# Patient Record
Sex: Female | Born: 1950 | Race: White | Hispanic: Yes | Marital: Married | Smoking: Never smoker
Health system: Southern US, Community
[De-identification: ages and names within clinical notes are randomized; demographics above are authoritative.]

## PROBLEM LIST (undated history)

## (undated) DIAGNOSIS — B571 Acute Chagas' disease without heart involvement: Principal | ICD-10-CM

## (undated) DIAGNOSIS — T8859XA Other complications of anesthesia, initial encounter: Secondary | ICD-10-CM

## (undated) DIAGNOSIS — D509 Iron deficiency anemia, unspecified: Secondary | ICD-10-CM

## (undated) DIAGNOSIS — E538 Deficiency of other specified B group vitamins: Secondary | ICD-10-CM

## (undated) DIAGNOSIS — J309 Allergic rhinitis, unspecified: Secondary | ICD-10-CM

## (undated) DIAGNOSIS — R51 Headache: Secondary | ICD-10-CM

## (undated) DIAGNOSIS — T4145XA Adverse effect of unspecified anesthetic, initial encounter: Secondary | ICD-10-CM

## (undated) DIAGNOSIS — K219 Gastro-esophageal reflux disease without esophagitis: Secondary | ICD-10-CM

## (undated) HISTORY — DX: Headache: R51

## (undated) HISTORY — DX: Acute Chagas' disease without heart involvement: B57.1

## (undated) HISTORY — PX: BUNIONECTOMY: SHX129

## (undated) HISTORY — DX: Deficiency of other specified B group vitamins: E53.8

## (undated) HISTORY — DX: Other complications of anesthesia, initial encounter: T88.59XA

## (undated) HISTORY — PX: NEUROMA SURGERY: SHX722

## (undated) HISTORY — DX: Allergic rhinitis, unspecified: J30.9

## (undated) HISTORY — DX: Adverse effect of unspecified anesthetic, initial encounter: T41.45XA

## (undated) HISTORY — DX: Iron deficiency anemia, unspecified: D50.9

## (undated) HISTORY — PX: BARTHOLIN CYST MARSUPIALIZATION: SHX5383

---

## 2006-10-18 ENCOUNTER — Other Ambulatory Visit: Admission: RE | Admit: 2006-10-18 | Discharge: 2006-10-18 | Payer: Self-pay | Admitting: Obstetrics and Gynecology

## 2006-10-20 ENCOUNTER — Encounter: Admission: RE | Admit: 2006-10-20 | Discharge: 2006-10-20 | Payer: Self-pay | Admitting: Obstetrics and Gynecology

## 2007-10-24 ENCOUNTER — Encounter: Admission: RE | Admit: 2007-10-24 | Discharge: 2007-10-24 | Payer: Self-pay | Admitting: Obstetrics and Gynecology

## 2007-11-07 ENCOUNTER — Other Ambulatory Visit: Admission: RE | Admit: 2007-11-07 | Discharge: 2007-11-07 | Payer: Self-pay | Admitting: Obstetrics and Gynecology

## 2008-03-15 LAB — CONVERTED CEMR LAB: Pap Smear: NORMAL

## 2008-10-22 LAB — CONVERTED CEMR LAB: Pap Smear: NORMAL

## 2008-11-04 ENCOUNTER — Encounter: Admission: RE | Admit: 2008-11-04 | Discharge: 2008-11-04 | Payer: Self-pay | Admitting: Obstetrics & Gynecology

## 2009-05-19 ENCOUNTER — Ambulatory Visit: Payer: Self-pay | Admitting: Internal Medicine

## 2009-05-19 DIAGNOSIS — R51 Headache: Secondary | ICD-10-CM

## 2009-05-19 DIAGNOSIS — J309 Allergic rhinitis, unspecified: Secondary | ICD-10-CM

## 2009-05-19 DIAGNOSIS — R519 Headache, unspecified: Secondary | ICD-10-CM | POA: Insufficient documentation

## 2009-05-19 HISTORY — DX: Headache: R51

## 2009-05-19 HISTORY — DX: Allergic rhinitis, unspecified: J30.9

## 2009-09-23 ENCOUNTER — Encounter: Payer: Self-pay | Admitting: Internal Medicine

## 2009-11-04 ENCOUNTER — Encounter: Admission: RE | Admit: 2009-11-04 | Discharge: 2009-11-04 | Payer: Self-pay | Admitting: Obstetrics & Gynecology

## 2009-11-04 LAB — HM MAMMOGRAPHY: HM Mammogram: NORMAL

## 2009-12-02 LAB — CONVERTED CEMR LAB: Pap Smear: NORMAL

## 2010-01-27 ENCOUNTER — Ambulatory Visit: Payer: Self-pay | Admitting: Internal Medicine

## 2010-01-27 DIAGNOSIS — I6529 Occlusion and stenosis of unspecified carotid artery: Secondary | ICD-10-CM

## 2010-02-03 ENCOUNTER — Encounter: Payer: Self-pay | Admitting: Internal Medicine

## 2010-02-06 ENCOUNTER — Ambulatory Visit: Payer: Self-pay

## 2010-02-06 ENCOUNTER — Encounter: Payer: Self-pay | Admitting: Internal Medicine

## 2010-04-07 ENCOUNTER — Telehealth: Payer: Self-pay | Admitting: Internal Medicine

## 2010-04-14 NOTE — Assessment & Plan Note (Signed)
Summary: PER PT PER OPTHAMOLOGIST SEE PC PHY-OTHER BLOODWORK/TEST-BEHI...   Vital Signs:  Patient profile:   60 year old female Menstrual status:  postmenopausal Height:      65 inches Weight:      141.50 pounds BMI:     23.63 O2 Sat:      96 % on Room air Temp:     97.6 degrees F oral Pulse rate:   61 / minute Pulse rhythm:   regular Resp:     16 per minute BP sitting:   104 / 62  (left arm) Cuff size:   regular  Vitals Entered By: Estell Harpin CMA (January 27, 2010 11:16 AM)  O2 Flow:  Room air CC: follow-up visit per optometrist, Preventive Care  Does patient need assistance? Functional Status Self care Ambulation Normal     Menstrual Status postmenopausal Last PAP Result Normal   Primary Care Provider:  Ronnald Ramp  CC:  follow-up visit per optometrist and Preventive Care.  History of Present Illness: She returns with a note from an Optometrist in [place] that states she has some ocular hemorrhages and asks that I look at her carotid arteries for disease. The pt. had some labs done 2 months ago by Dr. Marisa Hua in [place] but she can't get the results from that doctor. She was prescribed Crestor before but did not take it and one other statin caused muscle aches.  Preventive Screening-Counseling & Management  Alcohol-Tobacco     Alcohol drinks/day: 0     Alcohol Counseling: not indicated; patient does not drink     Smoking Status: never     Tobacco Counseling: not indicated; no tobacco use  Hep-HIV-STD-Contraception     Hepatitis Risk: no risk noted     HIV Risk: no risk noted     STD Risk: no risk noted      Sexual History:  currently monogamous.        Drug Use:  no.        Blood Transfusions:  no.    Clinical Review Panels:  Prevention   Last Mammogram:  Normal Bilateral (11/04/2009)   Last Pap Smear:  Normal (12/02/2009)  Immunizations   Last Tetanus Booster:  Tdap (05/19/2009)   Current Medications (verified): 1)  Zyrtec-D Allergy & Congestion  5-120 Mg Xr12h-Tab (Cetirizine-Pseudoephedrine) 2)  Calcium Citrate .Marland Kitchen.. 2 Once Daily 3)  Vitamin D3 5000 .... Take 1 Tablet By Mouth Once A Day 4)  Fish Oil 1000 .... Take 1 Tablet By Mouth Once A Day 5)  Dhea 77m .... Take 1 Tablet By Mouth Once A Day 6)  Resveratrol Plus .... Take 1 Tablet By Mouth Once A Day 7)  Bio- Hormone Cream  Allergies (verified): No Known Drug Allergies  Past History:  Past Medical History: Last updated: 05/19/2009 Allergic rhinitis Headache  Past Surgical History: Last updated: 05/19/2009 Denies surgical history  Family History: Last updated: 05/19/2009 Family History of Alcoholism/Addiction Family History Hypertension Family History of Prostate CA 1st degree relative <50  Social History: Last updated: 05/19/2009 Occupation: RN Married Never Smoked Alcohol use-no Drug use-no Regular exercise-no  Risk Factors: Alcohol Use: 0 (01/27/2010) Exercise: no (05/19/2009)  Risk Factors: Smoking Status: never (01/27/2010)  Family History: Reviewed history from 05/19/2009 and no changes required. Family History of Alcoholism/Addiction Family History Hypertension Family History of Prostate CA 1st degree relative <50  Social History: Reviewed history from 05/19/2009 and no changes required. 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   Neuro:  Denies brief paralysis, difficulty with concentration, disturbances in coordination, memory loss, numbness, poor balance, seizures, sensation of room spinning, tingling, tremors, visual disturbances, and weakness.  Physical Exam  General:  alert, well-developed, well-nourished, well-hydrated, appropriate dress, normal appearance, healthy-appearing, cooperative  to examination, and good hygiene.   Mouth:  good dentition, no gingival abnormalities, no dental plaque, pharynx pink and moist, no erythema, no exudates, and no posterior lymphoid hypertrophy.   Neck:  No deformities, masses, or tenderness noted.normal carotid upstroke, no carotid bruits, and no cervical lymphadenopathy.   Lungs:  Normal respiratory effort, chest expands symmetrically. Lungs are clear to auscultation, no crackles or wheezes. Heart:  Normal rate and regular rhythm. S1 and S2 normal without gallop, murmur, click, rub or other extra sounds. Abdomen:  Bowel sounds positive,abdomen soft and non-tender without masses, organomegaly or hernias noted. Msk:  No deformity or scoliosis noted of thoracic or lumbar spine.   Pulses:  R and L carotid,radial,femoral,dorsalis pedis and posterior tibial pulses are full and equal bilaterally Extremities:  No clubbing, cyanosis, edema, or deformity noted with normal full range of motion of all joints.   Neurologic:  No cranial nerve deficits noted. Station and gait are normal. Plantar reflexes are down-going bilaterally. DTRs are symmetrical throughout. Sensory, motor and coordinative functions appear intact. Skin:  Intact without suspicious lesions or rashes Cervical Nodes:  no anterior cervical adenopathy and no posterior cervical adenopathy.   Psych:  Cognition and judgment appear intact. Alert and cooperative with normal attention span and concentration. No apparent delusions, illusions, hallucinations   Impression & Recommendations:  Problem # 1:  CAROTID ARTERY DISEASE (ICD-433.10) Assessment New labs from Dr. Doroteo Bradford have been requested, I will check an u/s Orders: Doppler Referral (Doppler)  Complete Medication List: 1)  Zyrtec-d Allergy & Congestion 5-120 Mg Xr12h-tab (Cetirizine-pseudoephedrine) 2)  Calcium Citrate  .Marland Kitchen.. 2 once daily 3)  Vitamin D3 5000  .... Take 1 tablet by mouth once a day 4)  Fish Oil 1000  .... Take 1 tablet by  mouth once a day 5)  Dhea 65m  .... Take 1 tablet by mouth once a day 6)  Resveratrol Plus  .... Take 1 tablet by mouth once a day 7)  Bio- Hormone Cream   PAP Screening:    Hx Cervical Dysplasia in last 5 yrs? No    3 normal PAP smears in last 5 yrs? Yes    Last PAP smear:  12/02/2009  PAP Smear Results:    Date of Exam:  12/02/2009    Results:  Normal  Mammogram Screening:    Last Mammogram:  11/04/2009  Mammogram Results:    Date of Exam:  11/04/2009    Results:  Normal Bilateral  Osteoporosis Risk Assessment:  Risk Factors for Fracture or Low Bone Density:   Smoking status:       never  Immunization & Chemoprophylaxis:    Tetanus vaccine: Tdap  (05/19/2009)  Patient Instructions: 1)  Please schedule a follow-up appointment in 1 month.   Orders Added: 1)  Doppler Referral [Doppler] 2)  Est. Patient Level III [[40981]

## 2010-04-14 NOTE — Letter (Signed)
Summary: [place] [facility] [employer] [facility]   Imported By: Phillis Knack 01/30/2010 13:08:54  _____________________________________________________________________  External Attachment:    Type:   Image     Comment:   External Document

## 2010-04-14 NOTE — Assessment & Plan Note (Signed)
Summary: NEW/ [employer] / [employer] #   Vital Signs:  Patient profile:   60 year old female Height:      65 inches Weight:      149 pounds BMI:     24.88 O2 Sat:      97 % on Room air Temp:     98.4 degrees F oral Pulse rate:   92 / minute Pulse rhythm:   regular Resp:     16 per minute BP sitting:   100 / 70  (left arm) Cuff size:   large  Vitals Entered By: Estell Harpin CMA (May 19, 2009 1:08 PM)  O2 Flow:  Room air CC: chest congestion, cough w/ dark yellow mucus, Bilateral  ear pain, chills x 4days, URI symptoms, Preventive Care Is Patient Diabetic? No Pain Assessment Patient in pain? yes     Location: ears   Primary Care Provider:  Ronnald Ramp  CC:  chest congestion, cough w/ dark yellow mucus, Bilateral  ear pain, chills x 4days, URI symptoms, and Preventive Care.  History of Present Illness:  URI Symptoms      This is a 60 year old woman who presents with URI symptoms.  The symptoms began 4 days ago.  The severity is described as mild.  The patient reports sore throat, productive cough, earache, and sick contacts, but denies nasal congestion, clear nasal discharge, and purulent nasal discharge.  The patient denies fever, stiff neck, dyspnea, wheezing, rash, vomiting, diarrhea, use of an antipyretic, and response to antipyretic.  The patient also reports headache, muscle aches, and severe fatigue.  The patient denies seasonal symptoms.  The patient denies the following risk factors for Strep sinusitis: unilateral facial pain, unilateral nasal discharge, poor response to decongestant, double sickening, tooth pain, Strep exposure, tender adenopathy, and absence of cough.    Preventive Screening-Counseling & Management  Alcohol-Tobacco     Alcohol drinks/day: 0     Smoking Status: never  Caffeine-Diet-Exercise     Does Patient Exercise: no  Hep-HIV-STD-Contraception     Hepatitis Risk: no risk noted     HIV Risk: no risk noted     STD Risk: no risk noted      Sexual History:   currently monogamous.        Drug Use:  no.        Blood Transfusions:  no.    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    Physical Exam  General:  alert, well-developed, well-nourished, well-hydrated, appropriate dress, normal appearance, healthy-appearing, cooperative to examination, and good hygiene.   Head:  normocephalic, atraumatic, no abnormalities observed, and no abnormalities palpated.   Eyes:  vision grossly intact, pupils equal, pupils round, and  pupils reactive to light.   Ears:  R ear normal and L ear normal.   Nose:  no external deformity, no airflow obstruction, no nasal polyps, no nasal mucosal lesions, no mucosal friability, no active bleeding or clots, no sinus percussion tenderness, mucosal erythema, and mucosal edema.   Neck:  No deformities, masses, or tenderness noted. Lungs:  Normal respiratory effort, chest expands symmetrically. Lungs are clear to auscultation, no crackles or wheezes. Heart:  Normal rate and regular rhythm. S1 and S2 normal without gallop, murmur,  click, rub or other extra sounds. Abdomen:  Bowel sounds positive,abdomen soft and non-tender without masses, organomegaly or hernias noted. Msk:  No deformity or scoliosis noted of thoracic or lumbar spine.   Pulses:  R and L carotid,radial,femoral,dorsalis pedis and posterior tibial pulses are full and equal bilaterally Extremities:  No clubbing, cyanosis, edema, or deformity noted with normal full range of motion of all joints.   Neurologic:  No cranial nerve deficits noted. Station and gait are normal. Plantar reflexes are down-going bilaterally. DTRs are symmetrical throughout. Sensory, motor and coordinative functions appear intact. Skin:  Intact without suspicious lesions or rashes Cervical Nodes:  no anterior cervical adenopathy and no posterior cervical adenopathy.   Axillary Nodes:  no R axillary adenopathy and no L axillary adenopathy.   Inguinal Nodes:  no R inguinal adenopathy and no L inguinal adenopathy.   Psych:  Cognition and judgment appear intact. Alert and cooperative with normal attention span and concentration. No apparent delusions, illusions, hallucinations   Impression & Recommendations:  Problem # 1:  BRONCHITIS-ACUTE (ICD-466.0) Assessment New  Her updated medication list for this problem includes:    Zyrtec-d Allergy & Congestion 5-120 Mg Xr12h-tab (Cetirizine-pseudoephedrine)    Zithromax Tri-pak 500 Mg Tab (Azithromycin) .Marland Kitchen... Take as directed one by mouth once daily for 3 days    Mytussin Ac 100-10 Mg/76m Syrp (Guaifenesin-codeine) ..Marland Kitchen.. 5-10 ml by mouth qid as needed for cough  Take antibiotics and other medications as directed. Encouraged to push clear liquids, get enough rest, and take acetaminophen as needed. To be seen in 5-7 days if no improvement, sooner if worse.  Problem # 2:  ALLERGIC RHINITIS (ICD-477.9) Assessment: Deteriorated  Discussed use of allergy medications and environmental measures.   Problem # 3:  COUGH (ICD-786.2) Assessment:  New will look for pna, edema, mass Orders: T-2 View CXR (71020TC)  Complete Medication List: 1)  Zyrtec-d Allergy & Congestion 5-120 Mg Xr12h-tab (Cetirizine-pseudoephedrine) 2)  Calcium  3)  Vitamins  4)  Zithromax Tri-pak 500 Mg Tab (Azithromycin) .... Take as directed one by mouth once daily for 3 days 5)  Mytussin Ac 100-10 Mg/575mSyrp (Guaifenesin-codeine) .... 5-10 ml by mouth qid as needed for cough  Other Orders: Tdap => [id number] (90(95284dmin 1st Vaccine (90(13244PAP Screening:    Hx Cervical Dysplasia in last 5 yrs? No    3 normal PAP smears in last 5 yrs? Yes    Last PAP smear:  10/22/2008  PAP Smear Results:    Date of Exam:  10/22/2008    Results:  Normal  Mammogram Screening:    Last Mammogram:  10/29/2008  Mammogram Results:    Date of Exam:  10/29/2008    Results:  Normal Bilateral  Osteoporosis Risk Assessment:  Risk Factors for Fracture or Low Bone Density:   Smoking status:       never  Immunization & Chemoprophylaxis:    Tetanus vaccine: Tdap  (05/19/2009)  Patient Instructions: 1)  Please schedule a follow-up appointment in 2 weeks. 2)  Take your antibiotic as prescribed until ALL of it is gone, but stop if you develop a rash or swelling and contact our office as soon as possible. 3)  Acute bronchitis symptoms for less than 10 days are not helped by antibiotics. take over the counter cough medications. call if no improvment in  5-7 days, sooner if increasing cough, fever, or new symptoms( shortness of breath, chest pain). Prescriptions: MYTUSSIN AC 100-10 MG/5ML SYRP (GUAIFENESIN-CODEINE) 5-10 ml by mouth QID as needed for cough  #6 ounces x 0   Entered and Authorized by:   Janith Lima MD   Signed by:   Janith Lima MD on 05/19/2009   Method used:   Print then Give to Patient   RxID:   [id number] ZITHROMAX TRI-PAK 500 MG TAB (AZITHROMYCIN) Take as directed one by mouth once daily for 3 days  #3 x 0   Entered and Authorized by:    Janith Lima MD   Signed by:   Janith Lima MD on 05/19/2009   Method used:   Print then Give to Patient   RxID:   [id number]   Preventive Care Screening  Mammogram:    Date:  03/15/2008    Results:  normal   Pap Smear:    Date:  03/15/2008    Results:  normal    Immunizations Administered:  Tetanus Vaccine:    Vaccine Type: Tdap    Site: right deltoid    Mfr: GlaxoSmithKline    Dose: 0.5 ml    Route: IM    Given by: Estell Harpin CMA    Exp. Date: 05/10/2011    Lot #: [id number]    VIS given: 01/31/07 version given May 19, 2009.

## 2010-04-14 NOTE — Miscellaneous (Signed)
Summary: Orders Update  Clinical Lists Changes  Orders: Added new Test order of Carotid Duplex (Carotid Duplex) - Signed 

## 2010-04-16 NOTE — Progress Notes (Signed)
  Phone Note Other Incoming Message from:  Patient on April 07, 2010 9:44 AM  Caller: [phone] Summary of Call: Pt requesting results of her carotid doppler, Please Advise Initial call taken by: Ami Bullins CMA,  April 07, 2010 9:46 AM  Follow-up for Phone Call        normal Follow-up by: Janith Lima MD,  April 07, 2010 10:01 AM  Additional Follow-up for Phone Call Additional follow up Details #1::        advised pt that results were normal Additional Follow-up by: Ami Bullins CMA,  April 08, 2010 9:42 AM

## 2010-10-30 ENCOUNTER — Other Ambulatory Visit: Payer: Self-pay | Admitting: Obstetrics & Gynecology

## 2010-10-30 DIAGNOSIS — Z1231 Encounter for screening mammogram for malignant neoplasm of breast: Secondary | ICD-10-CM

## 2010-11-06 ENCOUNTER — Ambulatory Visit
Admission: RE | Admit: 2010-11-06 | Discharge: 2010-11-06 | Disposition: A | Discharge status: Home / Self Care | Payer: BC Managed Care – PPO | Source: Ambulatory Visit | Attending: Obstetrics & Gynecology | Admitting: Obstetrics & Gynecology

## 2010-11-06 DIAGNOSIS — Z1231 Encounter for screening mammogram for malignant neoplasm of breast: Secondary | ICD-10-CM

## 2010-12-29 ENCOUNTER — Encounter: Payer: Self-pay | Admitting: Internal Medicine

## 2010-12-29 ENCOUNTER — Ambulatory Visit (INDEPENDENT_AMBULATORY_CARE_PROVIDER_SITE_OTHER): Payer: BC Managed Care – PPO | Admitting: Internal Medicine

## 2010-12-29 ENCOUNTER — Other Ambulatory Visit (INDEPENDENT_AMBULATORY_CARE_PROVIDER_SITE_OTHER): Payer: BC Managed Care – PPO

## 2010-12-29 VITALS — BP 112/70 | HR 90 | Temp 98.5°F | Ht 65.0 in

## 2010-12-29 DIAGNOSIS — B572 Chagas' disease (chronic) with heart involvement: Secondary | ICD-10-CM

## 2010-12-29 DIAGNOSIS — R5381 Other malaise: Secondary | ICD-10-CM

## 2010-12-29 DIAGNOSIS — B571 Acute Chagas' disease without heart involvement: Secondary | ICD-10-CM

## 2010-12-29 DIAGNOSIS — R5383 Other fatigue: Secondary | ICD-10-CM | POA: Insufficient documentation

## 2010-12-29 HISTORY — DX: Chagas' disease (chronic) with heart involvement: B57.2

## 2010-12-29 HISTORY — DX: Acute Chagas' disease without heart involvement: B57.1

## 2010-12-29 LAB — CBC WITH DIFFERENTIAL/PLATELET
Basophils Absolute: 0.1 10*3/uL (ref 0.0–0.1)
Eosinophils Relative: 2.3 % (ref 0.0–5.0)
HCT: 38 % (ref 36.0–46.0)
Hemoglobin: 12.7 g/dL (ref 12.0–15.0)
Lymphocytes Relative: 34.8 % (ref 12.0–46.0)
Lymphs Abs: 2.1 10*3/uL (ref 0.7–4.0)
Monocytes Relative: 10.9 % (ref 3.0–12.0)
Neutro Abs: 3.2 10*3/uL (ref 1.4–7.7)
Platelets: 262 10*3/uL (ref 150.0–400.0)
RDW: 13.5 % (ref 11.5–14.6)
WBC: 6.2 10*3/uL (ref 4.5–10.5)

## 2010-12-29 NOTE — Patient Instructions (Addendum)
Your EKG was ok today You will be contacted regarding the referral for: echocardiogram, and Infectious Disease Please go to LAB in the Basement for the blood tests to be done today Please call the phone number [phone] (the [facility]) for results of testing in 2-3 days;  When calling, simply dial the number, and when prompted enter the MRN number above (the Medical Record Number) and the # key, then the message should start.

## 2010-12-29 NOTE — Progress Notes (Signed)
  Subjective:    Patient ID: Donna Holmes, female    DOB: [birth year], 60 y.o.   MRN: [id number]  HPI   60yo F, [employer] nurse, here to c/o ongoing diarrhea, though mild is persistent.  Pt was in [place] in Feb/mar 2012, with onset diarrhea at that time, initially worse then and some slowing in freq and severity since then without n/v, high fever/chills, blood or signficant wt loss.  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   She has some left neck pain sharp, minor without radicular pain but no other symptom recent onset   Pt denies chest pain, increased sob or doe, wheezing, orthopnea, PND, increased LE swelling, palpitations, dizziness or syncope.  Pt denies new neurological symptoms such as new headache, or facial or extremity weakness or numbness.   Pt denies polydipsia, polyuria. She has looked up some information online and notes anti-parasitic tx may require working with the CDC. Does have sense of ongoing fatigue. Past Medical History  Diagnosis Date  . ALLERGIC RHINITIS 05/19/2009  . CAROTID ARTERY DISEASE 01/27/2010  . Headache 05/19/2009  . 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 No pertinent past surgical history.  reports that she has never smoked. She does not have any smokeless tobacco history on file. She reports that she does not drink alcohol or use illicit drugs. family history includes Alcohol abuse in her other; Hypertension in her other; and Prostate cancer in her other. No Known Allergies No current outpatient prescriptions on file prior to visit.   Review of Systems Review of Systems  Constitutional: Negative for diaphoresis and unexpected weight change.  HENT: Negative for drooling and tinnitus.   Eyes: Negative for photophobia and visual disturbance.    Respiratory: Negative for choking and stridor.   Gastrointestinal: Negative for vomiting and blood in stool.  Genitourinary: Negative for hematuria and decreased urine volume.  Musculoskeletal: Negative for gait problem.  Skin: Negative for color change and wound.  Neurological: Negative for tremors and numbness.  Psychiatric/Behavioral: Negative for decreased concentration. The patient is not hyperactive.      Objective:   Physical Exam BP 112/70  Pulse 90  Temp(Src) 98.5 F (36.9 C) (Oral)  Ht 5' 5" (1.651 m)  SpO2 95% Physical Exam  VS noted Constitutional: Pt appears well-developed and well-nourished.  HENT: Head: Normocephalic.  Right Ear: External ear normal.  Left Ear: External ear normal.  Eyes: Conjunctivae and EOM are normal. Pupils are equal, round, and reactive to light.  Neck: Normal range of motion. Neck supple.  Cardiovascular: Normal rate and regular rhythm.   Pulmonary/Chest: Effort normal and breath sounds normal.  Abd:  Soft, NT, non-distended, + BS Neurological: Pt is alert. No cranial nerve deficit.  Skin: Skin is warm. No erythema. No LE edema Psychiatric: Pt behavior is normal. Thought content normal.     Assessment & Plan:

## 2010-12-29 NOTE — Assessment & Plan Note (Signed)
Still with some symptoms mild diarrhea, but fortunately o/w doing well;  ECG reviewed as per emr, also for echo, and refer ID for individiulized tx , that may need coordination with the CDC

## 2010-12-30 LAB — BASIC METABOLIC PANEL
Calcium: 9.6 mg/dL (ref 8.4–10.5)
GFR: 92.96 mL/min (ref >60.00)
Glucose, Bld: 83 mg/dL (ref 70–99)
Potassium: 4.3 mEq/L (ref 3.5–5.1)
Sodium: 139 mEq/L (ref 135–145)

## 2010-12-30 LAB — HEPATIC FUNCTION PANEL
AST: 25 U/L (ref 0–37)
Albumin: 4.4 g/dL (ref 3.5–5.2)
Alkaline Phosphatase: 41 U/L (ref 39–117)
Total Bilirubin: 0.7 mg/dL (ref 0.3–1.2)

## 2010-12-30 NOTE — Progress Notes (Signed)
Quick Note:  Voice message left on [employer] system - lab is negative, normal or otherwise stable, pt to continue same tx ______

## 2010-12-31 ENCOUNTER — Telehealth: Payer: Self-pay | Admitting: *Deleted

## 2010-12-31 NOTE — Telephone Encounter (Signed)
I referred pt to ID for this purpose as I have no experience or expertise and is beyond my field of training

## 2010-12-31 NOTE — Telephone Encounter (Signed)
Dr. Jenny Reichmann saw her for this

## 2010-12-31 NOTE — Telephone Encounter (Signed)
Pt called triage stating:Dr. Ronnald Ramp needs to contact [place]on Pier at [facility] ph # [phone] to advise PCP as to what treatment and/or further eval of Chagas' disease is needed.

## 2010-12-31 NOTE — Telephone Encounter (Signed)
Dr. Jenny Reichmann- Please advise.

## 2011-01-01 NOTE — Telephone Encounter (Signed)
Pt advised and will contact [facility]

## 2011-01-04 ENCOUNTER — Ambulatory Visit: Payer: BC Managed Care – PPO | Admitting: Infectious Disease

## 2011-01-06 ENCOUNTER — Encounter: Payer: Self-pay | Admitting: Infectious Disease

## 2011-01-06 ENCOUNTER — Ambulatory Visit (INDEPENDENT_AMBULATORY_CARE_PROVIDER_SITE_OTHER): Payer: BC Managed Care – PPO | Admitting: Infectious Disease

## 2011-01-06 VITALS — BP 113/74 | HR 80 | Temp 98.1°F | Wt 144.0 lb

## 2011-01-06 DIAGNOSIS — R5383 Other fatigue: Secondary | ICD-10-CM

## 2011-01-06 DIAGNOSIS — B571 Acute Chagas' disease without heart involvement: Secondary | ICD-10-CM

## 2011-01-06 DIAGNOSIS — B572 Chagas' disease (chronic) with heart involvement: Secondary | ICD-10-CM

## 2011-01-06 DIAGNOSIS — R5381 Other malaise: Secondary | ICD-10-CM

## 2011-01-06 DIAGNOSIS — R197 Diarrhea, unspecified: Secondary | ICD-10-CM

## 2011-01-06 DIAGNOSIS — R0789 Other chest pain: Secondary | ICD-10-CM

## 2011-01-06 NOTE — Assessment & Plan Note (Signed)
Sounds like could have been a travellers diarrhea. Has largely resolved though still with some lose stools

## 2011-01-06 NOTE — Assessment & Plan Note (Signed)
She certainly has the epidemiologic risk for infection with T Cruzi as a child in [place] or with recent trips there (though that exposure would be less likely to put her at risk given where she travelled) [facility] paras[facility]ology lab today is no longer open. 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 If labs are positive I would consider treatment in absence of contraindication. I will talk further with my colleague Dr. Linus Salmons who is a Chagas expert and with the CDC. I will likely in that case want to check echo to evaluate for cardiomyopathy.

## 2011-01-06 NOTE — Progress Notes (Signed)
Subjective:    Patient ID: Donna Holmes, female    DOB: 1950/11/14, 61 y.o.   MRN: [id number]  HPI  59 year old [place] Native referred to our clinic for consult and  evaluation of antibodies positive for Chagas.  [place], and then [place]  moved to the [place] in [place]. Homes were adobe, but not thatched when she was a child. She did not recognize the reduvid bug when shown images specifically. She did not recall ever having had the periorbital swelling of the [place] sign as a child or since then.She had also Gave blood in 1990s but she believes (and she may be right) that Chagas was not assayed for then. She travels to [place] every year for a month or two every year, more in urban centers Were [place] in February in [place]. She had a diarrheal illness when she returned in February. Non bloody diarrhea. No fevers, with abdominal pain. She has continued to have loose stools once in a while. This was checked for parasites and was negative. Her 12 point ros is pertinent for fatigue, and also occasional chest tightness with vigorous walking that lasts a few seconds and she thinks may be anxiety. She also has noticed difficulty breathing while sleeping on her right side but not on back or left side. She has no problems with swallowing. [phone] [place]on Pier  Review of Systems  Constitutional: Negative for fever, chills, diaphoresis, activity change, appetite change, fatigue and unexpected weight change.  HENT: Negative for congestion, sore throat, rhinorrhea, sneezing, trouble swallowing and sinus pressure.   Eyes: Negative for photophobia and visual disturbance.  Respiratory: Negative for cough, chest tightness, shortness of breath, wheezing and stridor.   Cardiovascular: Negative for chest pain, palpitations and leg swelling.  Gastrointestinal: Negative for nausea, vomiting, abdominal pain, diarrhea, constipation, blood in stool, abdominal distention and  anal bleeding.  Genitourinary: Negative for dysuria, hematuria, flank pain and difficulty urinating.  Musculoskeletal: Negative for myalgias, back pain, joint swelling, arthralgias and gait problem.  Skin: Negative for color change, pallor, rash and wound.  Neurological: Negative for dizziness, tremors, weakness and light-headedness.  Hematological: Negative for adenopathy. Does not bruise/bleed easily.  Psychiatric/Behavioral: Negative for behavioral problems, confusion, sleep disturbance, dysphoric mood, decreased concentration and agitation.       Objective:   Physical Exam  Constitutional: She is oriented to person, place, and time. She appears well-developed and well-nourished. No distress.  HENT:  Head: Normocephalic and atraumatic.  Mouth/Throat: Oropharynx is clear and moist. No oropharyngeal exudate.  Eyes: Conjunctivae and EOM are normal. Pupils are equal, round, and reactive to light. No scleral icterus.  Neck: Normal range of motion. Neck supple. No JVD present.  Cardiovascular: Normal rate, regular rhythm and normal heart sounds.  Exam reveals no gallop and no friction rub.   No murmur heard. Pulmonary/Chest: Effort normal and breath sounds normal. No respiratory distress. She has no wheezes. She has no rales. She exhibits no tenderness.  Abdominal: She exhibits no distension and no mass. There is no tenderness. There is no rebound and no guarding.  Musculoskeletal: She exhibits no edema and no tenderness.  Lymphadenopathy:    She has no cervical adenopathy.  Neurological: She is alert and oriented to person, place, and time. She has normal reflexes. She exhibits normal muscle tone. Coordination normal.  Skin: Skin is warm and dry. She is not diaphoretic. No erythema. No pallor.  Psychiatric: She has a normal  mood and affect. Her behavior is normal. Judgment and thought content normal.          Assessment & Plan:

## 2011-01-06 NOTE — Assessment & Plan Note (Signed)
With exertion but otherwise withotu any associated symptoms and not typical of angina. WIll get TTE if Chagas labs positive

## 2011-01-06 NOTE — Assessment & Plan Note (Signed)
Gradually getting better

## 2011-01-07 NOTE — Progress Notes (Signed)
Addended by: Dolan Amen D on: 01/07/2011 11:12 AM   Modules accepted: Orders

## 2011-01-11 ENCOUNTER — Other Ambulatory Visit: Payer: BC Managed Care – PPO

## 2011-01-12 ENCOUNTER — Telehealth: Payer: Self-pay | Admitting: *Deleted

## 2011-01-12 NOTE — Telephone Encounter (Signed)
[facility] called for our Tax ID number. I gave it to them & it did not come up when they ran it. I asked Lamont Snowball in lab to find out if [facility] has a tax ID # for all of our labs. Sh eis going to check with the manager

## 2011-01-13 ENCOUNTER — Other Ambulatory Visit (HOSPITAL_COMMUNITY): Payer: BC Managed Care – PPO | Admitting: Radiology

## 2011-01-15 ENCOUNTER — Telehealth: Payer: Self-pay | Admitting: *Deleted

## 2011-01-15 NOTE — Telephone Encounter (Signed)
Dr. Lillia Abed called from the [facility] wanting to know what prompted pt to come to md. Reviewed notes. She had given blood & it came back positive for Chagas. I told md her presenting symptoms & travel history. She is going to call pt's internal medicine office to speak with him

## 2011-01-18 LAB — OTHER SOLSTAS TEST

## 2011-01-26 ENCOUNTER — Telehealth: Payer: Self-pay | Admitting: Licensed Clinical Social Worker

## 2011-01-26 NOTE — Telephone Encounter (Signed)
She has an appointment on 02/10/2011.

## 2011-01-26 NOTE — Telephone Encounter (Signed)
[facility] called stating that the Chagas T cruzi IFA was positive high reactive at 1:256 and positive for T Cruzi EIA 2.411 the lab will be faxing results as well.

## 2011-01-26 NOTE — Telephone Encounter (Signed)
Donna Holmes thanks, does she have a followup appt with [place]? I would like to review this test and the one she had from [facility] and consider? We should treat her, though I may need to consult with Dr. Linus Salmons our Chagas expert

## 2011-01-27 ENCOUNTER — Telehealth: Payer: Self-pay | Admitting: Infectious Disease

## 2011-01-27 ENCOUNTER — Other Ambulatory Visit: Payer: Self-pay | Admitting: Infectious Disease

## 2011-01-27 DIAGNOSIS — B571 Acute Chagas' disease without heart involvement: Secondary | ICD-10-CM

## 2011-01-27 NOTE — Telephone Encounter (Signed)
Patients T cruzi IgG antibody by IFA came back positive at >1"32 AND HER T. Cruzi IgG by EIA came back positive at 2.411 from the [facility]  We had send serum to [facility] but they only ran a IgM assay which is not useful in this patient.  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  I would like to check a 2 d echocardiogram and also proceed with getting Benznidazole for treatment. I will call patient to inform her.

## 2011-01-29 ENCOUNTER — Ambulatory Visit (HOSPITAL_COMMUNITY): Payer: BC Managed Care – PPO | Attending: Internal Medicine | Admitting: Radiology

## 2011-01-29 DIAGNOSIS — I079 Rheumatic tricuspid valve disease, unspecified: Secondary | ICD-10-CM | POA: Insufficient documentation

## 2011-01-29 DIAGNOSIS — R5381 Other malaise: Secondary | ICD-10-CM | POA: Insufficient documentation

## 2011-01-29 DIAGNOSIS — I379 Nonrheumatic pulmonary valve disorder, unspecified: Secondary | ICD-10-CM | POA: Insufficient documentation

## 2011-01-29 DIAGNOSIS — B571 Acute Chagas' disease without heart involvement: Secondary | ICD-10-CM

## 2011-01-29 DIAGNOSIS — I059 Rheumatic mitral valve disease, unspecified: Secondary | ICD-10-CM | POA: Insufficient documentation

## 2011-01-29 DIAGNOSIS — R0789 Other chest pain: Secondary | ICD-10-CM

## 2011-01-29 DIAGNOSIS — R5383 Other fatigue: Secondary | ICD-10-CM

## 2011-02-10 ENCOUNTER — Ambulatory Visit (INDEPENDENT_AMBULATORY_CARE_PROVIDER_SITE_OTHER): Payer: BC Managed Care – PPO | Admitting: Infectious Disease

## 2011-02-10 ENCOUNTER — Encounter: Payer: Self-pay | Admitting: Infectious Disease

## 2011-02-10 DIAGNOSIS — R5383 Other fatigue: Secondary | ICD-10-CM

## 2011-02-10 DIAGNOSIS — B571 Acute Chagas' disease without heart involvement: Secondary | ICD-10-CM

## 2011-02-10 DIAGNOSIS — R5381 Other malaise: Secondary | ICD-10-CM

## 2011-02-10 NOTE — Progress Notes (Signed)
Subjective:    Patient ID: Donna Holmes, female    DOB: Dec 14, 1950, 60 y.o.   MRN: [id number]  HPI  60 year old [place] Native referred to our clinic for consult and evaluation of antibodies positive for Chagas. [place] were [place], and then [place] moved to the [place] in [place]. Homes were adobe, but not thatched when she was a child. She did not recognize the reduvid bug when shown images specifically. She did not recall ever having had the periorbital swelling of the [place] sign as a child or since then.She had also Gave blood in 1990s but she believes (and she may be right) that Chagas was not assayed for then. She travels to [place] every year for a month or two every year, more in urban centers [place]. She had a diarrheal illness when she returned in February. Non bloody diarrhea. No fevers, with abdominal pain. She has continued to have loose stools once in a while. This was checked for parasites and was negative. Serologies were positive for Chagas by EIA and IFA. I have contacted CDC and am currently filling out forms to acquire benznidazole as IND (it is not licensed by FDA in [place]) for rx of chronic Chagas. I informed her that her biological son will also need to get checked due to concerns of vertical transmission. I explained side effects, and risks benefits and gave her paperwork on the medicine from CDC>  [phone] [place]   Review of Systems  Constitutional: Negative for fever, chills, diaphoresis, activity change, appetite change, fatigue and unexpected weight change.  HENT: Negative for congestion, sore throat, rhinorrhea, sneezing, trouble swallowing and sinus pressure.   Eyes: Negative for photophobia and visual disturbance.  Respiratory: Negative for cough, chest tightness, shortness of breath, wheezing and stridor.   Cardiovascular: Negative for chest pain, palpitations and leg swelling.  Gastrointestinal:  Negative for nausea, vomiting, abdominal pain, diarrhea, constipation, blood in stool, abdominal distention and anal bleeding.  Genitourinary: Negative for dysuria, hematuria, flank pain and difficulty urinating.  Musculoskeletal: Negative for myalgias, back pain, joint swelling, arthralgias and gait problem.  Skin: Negative for color change, pallor, rash and wound.  Neurological: Negative for dizziness, tremors, weakness and light-headedness.  Hematological: Negative for adenopathy. Does not bruise/bleed easily.  Psychiatric/Behavioral: Negative for behavioral problems, confusion, sleep disturbance, dysphoric mood, decreased concentration and agitation.       Objective:   Physical Exam  Constitutional: She is oriented to person, place, and time. She appears well-developed and well-nourished. No distress.  HENT:  Head: Normocephalic and atraumatic.  Mouth/Throat: Oropharynx is clear and moist. No oropharyngeal exudate.  Eyes: Conjunctivae and EOM are normal. Pupils are equal, round, and reactive to light. No scleral icterus.  Neck: Normal range of motion. Neck supple. No JVD present.  Cardiovascular: Normal rate, regular rhythm and normal heart sounds.  Exam reveals no gallop and no friction rub.   No murmur heard. Pulmonary/Chest: Effort normal and breath sounds normal. No respiratory distress. She has no wheezes. She has no rales. She exhibits no tenderness.  Abdominal: She exhibits no distension and no mass. There is no tenderness. There is no rebound and no guarding.  Musculoskeletal: She exhibits no edema and no tenderness.  Lymphadenopathy:    She has no cervical adenopathy.  Neurological: She is alert and oriented to person, place, and time. She has normal reflexes. She exhibits normal muscle tone. Coordination normal.  Skin:  Skin is warm and dry. She is not diaphoretic. No erythema. No pallor.  Psychiatric: She has a normal mood and affect. Her behavior is normal. Judgment and  thought content normal.          Assessment & Plan:

## 2011-02-10 NOTE — Assessment & Plan Note (Signed)
Acquiring meds from [facility]

## 2011-02-10 NOTE — Assessment & Plan Note (Signed)
imrpoved

## 2011-03-03 ENCOUNTER — Telehealth: Payer: Self-pay | Admitting: *Deleted

## 2011-03-03 ENCOUNTER — Ambulatory Visit: Payer: BC Managed Care – PPO | Admitting: Infectious Disease

## 2011-03-03 NOTE — Telephone Encounter (Signed)
Per Dr. Tommy Medal research medication if not ready for pt to start.  Dr. Tommy Medal requested that appt be scheduled for the end of January.  Pt given appt for 04/13/11.

## 2011-03-22 ENCOUNTER — Ambulatory Visit (INDEPENDENT_AMBULATORY_CARE_PROVIDER_SITE_OTHER): Payer: BC Managed Care – PPO | Admitting: Infectious Disease

## 2011-03-22 ENCOUNTER — Encounter: Payer: Self-pay | Admitting: Infectious Disease

## 2011-03-22 VITALS — Wt 145.5 lb

## 2011-03-22 DIAGNOSIS — B571 Acute Chagas' disease without heart involvement: Secondary | ICD-10-CM

## 2011-03-22 DIAGNOSIS — R51 Headache: Secondary | ICD-10-CM

## 2011-03-22 NOTE — Assessment & Plan Note (Signed)
chonic unchanged

## 2011-03-22 NOTE — Assessment & Plan Note (Signed)
I have obtained benznidazole and we will start at 164m twice daily for 60 days, Will bring back for check of cbc and cmp in 2 wks time and followup visit

## 2011-03-22 NOTE — Progress Notes (Signed)
  Subjective:    Patient ID: Donna Holmes, female    DOB: Dec 30, 1950, 61 y.o.   MRN: [id number]  HPI  61 year old lady with Chagas disease. I have obtained benznidazole from the CDC to treat Donna Holmes. Based on her weight of 66kg today in clinic her dose should be 12m or one and three fourths tablet taken after meals for 60 days.I explained risks and benefits of using this medication which in the [place] to be given as IND because it is not manufacured in [place] is only permitted to be used under IND by the FDA via release from the CDC. Benefits include cure of Chagas disease. Alternative therapy would be with nifurtimox which is a less efficacious drug with more side effects and is also only available as IND from the CDC. Main side effects observed include rashes, including photosensitive ones, paresthesias, headache, anorexia, insomnia, and rare abnormalities in LFts, wbc, platelets change in taste, The patient has signed informed consent [facility] and [facility]. I spent greater than 45 minutes with the patient including greater than 50% of time in face to face counsel of the patient and in coordination of their care.  Of note the pt already has some chronic numbness in her finger on right hand and also does already suffer from chronic headaches.  I spent greater than 45 minutes with the patient including greater than 50% of time in face to face counsel of the patient and in coordination of their care.   Review of Systems  Constitutional: Negative for fever, chills, diaphoresis, activity change, appetite change, fatigue and unexpected weight change.  HENT: Negative for congestion, sore throat, rhinorrhea, sneezing, trouble swallowing and sinus pressure.   Eyes: Negative for photophobia and visual disturbance.  Respiratory: Negative for cough, chest tightness, shortness of breath, wheezing and stridor.   Cardiovascular: Negative for chest pain, palpitations and  leg swelling.  Gastrointestinal: Negative for nausea, vomiting, abdominal pain, diarrhea, constipation, blood in stool, abdominal distention and anal bleeding.  Genitourinary: Negative for dysuria, hematuria, flank pain and difficulty urinating.  Musculoskeletal: Negative for myalgias, back pain, joint swelling, arthralgias and gait problem.  Skin: Negative for color change, pallor, rash and wound.  Neurological: Negative for dizziness, tremors, weakness and light-headedness.  Hematological: Negative for adenopathy. Does not bruise/bleed easily.  Psychiatric/Behavioral: Negative for behavioral problems, confusion, sleep disturbance, dysphoric mood, decreased concentration and agitation.       Objective:   Physical Exam  Constitutional: She is oriented to person, place, and time. She appears well-developed and well-nourished. No distress.  HENT:  Head: Normocephalic and atraumatic.  Eyes: EOM are normal.  Neck: Normal range of motion. Neck supple.  Cardiovascular: Normal rate and regular rhythm.   Pulmonary/Chest: Effort normal and breath sounds normal. No respiratory distress.  Abdominal: She exhibits no distension.  Neurological: She is alert and oriented to person, place, and time. She has normal reflexes. Coordination normal.  Skin: Skin is warm and dry. She is not diaphoretic. No erythema. No pallor.  Psychiatric: She has a normal mood and affect. Her behavior is normal. Judgment and thought content normal.          Assessment & Plan:  Chagas' disease I have obtained benznidazole and we will start at 1779mtwice daily for 60 days, Will bring back for check of cbc and cmp in 2 wks time and followup visit  Headache chonic unchanged

## 2011-04-01 ENCOUNTER — Other Ambulatory Visit: Payer: BC Managed Care – PPO | Admitting: *Deleted

## 2011-04-01 LAB — COMPLETE METABOLIC PANEL WITH GFR
ALT: 30 U/L (ref 0–35)
AST: 23 U/L (ref 0–37)
Alkaline Phosphatase: 55 U/L (ref 39–117)
BUN: 17 mg/dL (ref 6–23)
Calcium: 9.6 mg/dL (ref 8.4–10.5)
Chloride: 101 mEq/L (ref 96–112)
Creat: 0.78 mg/dL (ref 0.50–1.10)
Total Bilirubin: 0.5 mg/dL (ref 0.3–1.2)

## 2011-04-01 LAB — CBC WITH DIFFERENTIAL/PLATELET
Basophils Absolute: 0.1 10*3/uL (ref 0.0–0.1)
Basophils Relative: 1 % (ref 0–1)
Eosinophils Relative: 4 % (ref 0–5)
Lymphocytes Relative: 27 % (ref 12–46)
MCHC: 33.4 g/dL (ref 30.0–36.0)
MCV: 88 fL (ref 78.0–100.0)
Neutro Abs: 3.4 10*3/uL (ref 1.7–7.7)
Platelets: 208 10*3/uL (ref 150–400)
RDW: 13.4 % (ref 11.5–15.5)
WBC: 5.3 10*3/uL (ref 4.0–10.5)

## 2011-04-05 ENCOUNTER — Encounter: Payer: Self-pay | Admitting: Infectious Disease

## 2011-04-05 ENCOUNTER — Ambulatory Visit (INDEPENDENT_AMBULATORY_CARE_PROVIDER_SITE_OTHER): Payer: BC Managed Care – PPO | Admitting: Infectious Disease

## 2011-04-05 DIAGNOSIS — R5383 Other fatigue: Secondary | ICD-10-CM

## 2011-04-05 DIAGNOSIS — B571 Acute Chagas' disease without heart involvement: Secondary | ICD-10-CM

## 2011-04-05 DIAGNOSIS — R0789 Other chest pain: Secondary | ICD-10-CM

## 2011-04-05 NOTE — Progress Notes (Signed)
  Subjective:    Patient ID: Donna Holmes, female    DOB: 1951/01/08, 61 y.o.   MRN: [id number]  HPI 61 year old lady with Chagas disease. I Obtained benznidazole from the CDC to treat Mrs Raynor and started her on this at the last clinic visit. She is tolerating the meds without much problem. Her lfts and cbc are completely normal. She states that she does Feels a little bit more tired. At night feeling of chest pressure difficulty with a deep breath wh ich she had prior to starting the meds is slightly worse. Has been going on for months.   Review of Systems  Constitutional: Negative for fever, chills, diaphoresis, activity change, appetite change, fatigue and unexpected weight change.  HENT: Negative for congestion, sore throat, rhinorrhea, sneezing, trouble swallowing and sinus pressure.   Eyes: Negative for photophobia and visual disturbance.  Respiratory: Negative for cough, chest tightness, shortness of breath, wheezing and stridor.   Cardiovascular: Negative for chest pain, palpitations and leg swelling.  Gastrointestinal: Negative for nausea, vomiting, abdominal pain, diarrhea, constipation, blood in stool, abdominal distention and anal bleeding.  Genitourinary: Negative for dysuria, hematuria, flank pain and difficulty urinating.  Musculoskeletal: Negative for myalgias, back pain, joint swelling, arthralgias and gait problem.  Skin: Negative for color change, pallor, rash and wound.  Neurological: Negative for dizziness, tremors, weakness and light-headedness.  Hematological: Negative for adenopathy. Does not bruise/bleed easily.  Psychiatric/Behavioral: Negative for behavioral problems, confusion, sleep disturbance, dysphoric mood, decreased concentration and agitation.       Objective:   Physical Exam  Constitutional: She is oriented to person, place, and time. She appears well-developed and well-nourished. No distress.  HENT:  Head: Normocephalic and atraumatic.    Mouth/Throat: Oropharynx is clear and moist. No oropharyngeal exudate.  Eyes: Conjunctivae and EOM are normal. Pupils are equal, round, and reactive to light. No scleral icterus.  Neck: Normal range of motion. Neck supple. No JVD present.  Cardiovascular: Normal rate, regular rhythm and normal heart sounds.  Exam reveals no gallop and no friction rub.   No murmur heard. Pulmonary/Chest: Effort normal and breath sounds normal. No respiratory distress. She has no wheezes. She has no rales. She exhibits no tenderness.  Abdominal: She exhibits no distension and no mass. There is no tenderness. There is no rebound and no guarding.  Musculoskeletal: She exhibits no edema and no tenderness.  Lymphadenopathy:    She has no cervical adenopathy.  Neurological: She is alert and oriented to person, place, and time. She has normal reflexes. She exhibits normal muscle tone. Coordination normal.  Skin: Skin is warm and dry. She is not diaphoretic. No erythema. No pallor.  Psychiatric: She has a normal mood and affect. Her behavior is normal. Judgment and thought content normal.          Assessment & Plan:  Chagas' disease Continue benznidazole and complete 2 month treatment, bring back in 3 wks for check and for repeat blood work  Chest tightness Likely due to anxiety.   Fatigue Was present prior to meds being initiated. No need for further workup from my standpoint

## 2011-04-05 NOTE — Assessment & Plan Note (Signed)
Was present prior to meds being initiated. No need for further workup from my standpoint

## 2011-04-05 NOTE — Assessment & Plan Note (Signed)
Likely due to anxiety.

## 2011-04-05 NOTE — Assessment & Plan Note (Signed)
Continue benznidazole and complete 2 month treatment, bring back in 3 wks for check and for repeat blood work

## 2011-04-13 ENCOUNTER — Ambulatory Visit: Payer: BC Managed Care – PPO | Admitting: Infectious Disease

## 2011-04-14 ENCOUNTER — Ambulatory Visit: Payer: BC Managed Care – PPO | Admitting: Infectious Disease

## 2011-04-26 ENCOUNTER — Ambulatory Visit (INDEPENDENT_AMBULATORY_CARE_PROVIDER_SITE_OTHER): Payer: BC Managed Care – PPO | Admitting: Infectious Disease

## 2011-04-26 ENCOUNTER — Other Ambulatory Visit: Payer: Self-pay | Admitting: Infectious Disease

## 2011-04-26 ENCOUNTER — Encounter: Payer: Self-pay | Admitting: Infectious Disease

## 2011-04-26 VITALS — BP 111/71 | HR 88 | Temp 97.6°F | Wt 145.0 lb

## 2011-04-26 DIAGNOSIS — T50904A Poisoning by unspecified drugs, medicaments and biological substances, undetermined, initial encounter: Secondary | ICD-10-CM

## 2011-04-26 DIAGNOSIS — B571 Acute Chagas' disease without heart involvement: Secondary | ICD-10-CM

## 2011-04-26 DIAGNOSIS — L270 Generalized skin eruption due to drugs and medicaments taken internally: Secondary | ICD-10-CM | POA: Insufficient documentation

## 2011-04-26 LAB — CBC WITH DIFFERENTIAL/PLATELET
Basophils Absolute: 0.2 10*3/uL — ABNORMAL HIGH (ref 0.0–0.1)
Basophils Relative: 3 % — ABNORMAL HIGH (ref 0–1)
Lymphocytes Relative: 30 % (ref 12–46)
MCHC: 33.8 g/dL (ref 30.0–36.0)
Monocytes Absolute: 0.7 10*3/uL (ref 0.1–1.0)
Neutro Abs: 3.1 10*3/uL (ref 1.7–7.7)
Platelets: 263 10*3/uL (ref 150–400)
RDW: 14 % (ref 11.5–15.5)
WBC: 6.2 10*3/uL (ref 4.0–10.5)

## 2011-04-26 LAB — COMPLETE METABOLIC PANEL WITH GFR
Albumin: 4 g/dL (ref 3.5–5.2)
BUN: 15 mg/dL (ref 6–23)
CO2: 29 mEq/L (ref 19–32)
Calcium: 10.1 mg/dL (ref 8.4–10.5)
Chloride: 103 mEq/L (ref 96–112)
GFR, Est African American: 80 mL/min
GFR, Est Non African American: 69 mL/min
Glucose, Bld: 85 mg/dL (ref 70–99)
Potassium: 4.3 mEq/L (ref 3.5–5.3)
Sodium: 141 mEq/L (ref 135–145)
Total Protein: 7.4 g/dL (ref 6.0–8.3)

## 2011-04-26 MED ORDER — TRIAMCINOLONE ACETONIDE 0.5 % EX CREA: TOPICAL_CREAM | Freq: Three times a day (TID) | CUTANEOUS | Status: DC

## 2011-04-26 MED ORDER — HYDROXYZINE HCL 25 MG PO TABS: 25.0000 mg | ORAL_TABLET | ORAL | Status: AC

## 2011-04-26 MED ORDER — PREDNISONE 10 MG PO TABS: 20.0000 mg | ORAL_TABLET | ORAL | Status: DC

## 2011-04-26 NOTE — Assessment & Plan Note (Signed)
Continue current dose pending further guidance from CDC. CHeck cbc and cmp

## 2011-04-26 NOTE — Patient Instructions (Signed)
I have called the CDC drug service and they will call and or email me back today with further instructions  For now they agree with approach of trying topical steroids (triamicinolone) along with  Generic claritin (loratidine) 65m daily  And vistaril(hydroxyzine) 25 to 562mat night time and every eight hours as needed for itching  RTC in one week but call Dr. VaTommy Medalf side effects worsen

## 2011-04-26 NOTE — Assessment & Plan Note (Signed)
Will give her topical triamcinolone , claritin, and vistaril. I am corresponding with CDC for further guidance

## 2011-04-26 NOTE — Progress Notes (Signed)
Subjective:    Patient ID: Donna Holmes, female    DOB: 1950/06/30, 61 y.o.   MRN: [id number]  HPI  61 year old lady with Chagas disease. I Obtained benznidazole from the [facility] and we started her on therapy at 128m bid on 03/22/11 to treat [name] She initially tolerated these meds without problems. However, last Monday the 4th she developed a fine "sandpaper" like rash on her abdomen that worsened after she went outside later in the week. Rash extended to her chest, back legs and arms. She has also been experiencing intense pruritis, made worse after she showers. She has no exfoliation, no lip or eyelid swelling and no mucosal involvement. I called the CDC and am corresponding with them via email and phone later today. Recs in the literature provided by CDC suggest continuing the medicine with topical and possibly even systemic steroid, which I will do along with anti pruritic therapy for now. I spent greater than 45 minutes with the patient including greater than 50% of time in face to face counsel of the patient and in coordination of their care.  Review of Systems  Constitutional: Negative for fever, chills, diaphoresis, activity change, appetite change, fatigue and unexpected weight change.  HENT: Negative for congestion, sore throat, rhinorrhea, sneezing, trouble swallowing and sinus pressure.   Eyes: Negative for photophobia and visual disturbance.  Respiratory: Negative for cough, chest tightness, shortness of breath, wheezing and stridor.   Cardiovascular: Negative for chest pain, palpitations and leg swelling.  Gastrointestinal: Negative for nausea, vomiting, abdominal pain, diarrhea, constipation, blood in stool, abdominal distention and anal bleeding.  Genitourinary: Negative for dysuria, hematuria, flank pain and difficulty urinating.  Musculoskeletal: Negative for myalgias, back pain, joint swelling, arthralgias and gait problem.  Skin: Positive for color change and rash. Negative  for pallor and wound.  Neurological: Negative for dizziness, tremors, weakness and light-headedness.  Hematological: Negative for adenopathy. Does not bruise/bleed easily.  Psychiatric/Behavioral: Negative for behavioral problems, confusion, sleep disturbance, dysphoric mood, decreased concentration and agitation.       Objective:   Physical Exam  Constitutional: She is oriented to person, place, and time. She appears well-developed and well-nourished. No distress.  HENT:  Head: Normocephalic and atraumatic.  Mouth/Throat: Oropharynx is clear and moist. No oropharyngeal exudate.  Eyes: Conjunctivae and EOM are normal. Pupils are equal, round, and reactive to light. No scleral icterus.  Neck: Normal range of motion. Neck supple. No JVD present.  Cardiovascular: Normal rate, regular rhythm and normal heart sounds.  Exam reveals no gallop and no friction rub.   No murmur heard. Pulmonary/Chest: Effort normal and breath sounds normal. No respiratory distress. She has no wheezes. She has no rales. She exhibits no tenderness.  Abdominal: She exhibits no distension and no mass. There is no tenderness. There is no rebound and no guarding.  Musculoskeletal: She exhibits no edema and no tenderness.  Lymphadenopathy:    She has no cervical adenopathy.  Neurological: She is alert and oriented to person, place, and time. She has normal reflexes. She exhibits normal muscle tone. Coordination normal.  Skin: Skin is warm and dry. Rash noted. She is not diaphoretic. There is erythema. No pallor.     Psychiatric: She has a normal mood and affect. Her behavior is normal. Judgment and thought content normal.          Assessment & Plan:  Drug rash Will give her topical triamcinolone , claritin, and vistaril. I am corresponding with CDC for further guidance  Chagas' disease Continue current dose pending further guidance from [facility]. CHeck cbc and cmp

## 2011-05-05 ENCOUNTER — Ambulatory Visit (INDEPENDENT_AMBULATORY_CARE_PROVIDER_SITE_OTHER): Payer: BC Managed Care – PPO | Admitting: Infectious Disease

## 2011-05-05 ENCOUNTER — Encounter: Payer: Self-pay | Admitting: Infectious Disease

## 2011-05-05 DIAGNOSIS — L270 Generalized skin eruption due to drugs and medicaments taken internally: Secondary | ICD-10-CM

## 2011-05-05 DIAGNOSIS — B572 Chagas' disease (chronic) with heart involvement: Secondary | ICD-10-CM

## 2011-05-05 DIAGNOSIS — T50904A Poisoning by unspecified drugs, medicaments and biological substances, undetermined, initial encounter: Secondary | ICD-10-CM

## 2011-05-05 DIAGNOSIS — B571 Acute Chagas' disease without heart involvement: Secondary | ICD-10-CM

## 2011-05-05 NOTE — Progress Notes (Signed)
  Subjective:    Patient ID: Donna Holmes, female    DOB: [birth year], 61 y.o.   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 I Obtained benznidazole from the [facility] and we started her on therapy at 117m bid on 03/22/11 to treat [name] She initially tolerated these meds without problems. However, on Feb the 4th she developed a fine "sandpaper" like rash on her abdomen that worsened after she went outside later in the week. Rash extended to her chest, back legs and arms. She had also been experiencing intense pruritis, made worse after she showers. She has no exfoliation, no lip or eyelid swelling and no mucosal involvement. I called the CDC and corresponded w ith them via email and phone later today. Per their recommendations we put her on a prednisone taper and she has responded very nicely to it with rash having improved dramatically. Pruritis is minimal. She has no other symptoms.   Review of Systems  Constitutional: Negative for fever, chills, diaphoresis, activity change, appetite change, fatigue and unexpected weight change.  HENT: Negative for congestion, sore throat, rhinorrhea, sneezing, trouble swallowing and sinus pressure.   Eyes: Negative for photophobia and visual disturbance.  Respiratory: Negative for cough, chest tightness, shortness of breath, wheezing and stridor.   Cardiovascular: Negative for chest pain, palpitations and leg swelling.  Gastrointestinal: Negative for nausea, vomiting, abdominal pain, diarrhea, constipation, blood in stool, abdominal distention and anal bleeding.  Genitourinary: Negative for dysuria, hematuria, flank pain and difficulty urinating.  Musculoskeletal: Negative for myalgias, back pain, joint swelling, arthralgias and gait problem.  Skin: Positive for rash. Negative for color change, pallor and wound.  Neurological: Negative for dizziness, tremors, weakness and light-headedness.  Hematological: Negative for adenopathy. Does not  bruise/bleed easily.  Psychiatric/Behavioral: Negative for behavioral problems, confusion, sleep disturbance, dysphoric mood, decreased concentration and agitation.       Objective:   Physical Exam  Constitutional: She is oriented to person, place, and time. She appears well-developed and well-nourished. No distress.  HENT:  Head: Normocephalic and atraumatic.  Mouth/Throat: Oropharynx is clear and moist. No oropharyngeal exudate.  Eyes: Conjunctivae and EOM are normal. Pupils are equal, round, and reactive to light. No scleral icterus.  Neck: Normal range of motion. Neck supple. No JVD present.  Cardiovascular: Normal rate, regular rhythm and normal heart sounds.  Exam reveals no gallop and no friction rub.   No murmur heard. Pulmonary/Chest: Effort normal and breath sounds normal. No respiratory distress. She has no wheezes. She has no rales. She exhibits no tenderness.  Abdominal: She exhibits no distension and no mass. There is no tenderness. There is no rebound and no guarding.  Musculoskeletal: She exhibits no edema and no tenderness.  Lymphadenopathy:    She has no cervical adenopathy.  Neurological: She is alert and oriented to person, place, and time. She has normal reflexes. She exhibits normal muscle tone. Coordination normal.  Skin: Skin is warm and dry. She is not diaphoretic. No erythema. No pallor.     Psychiatric: She has a normal mood and affect. Her behavior is normal. Judgment and thought content normal.          Assessment & Plan:  Drug rash Continue prednisone taper per CDC watch for relapse with taper  Chagas' disease Finish her two month course

## 2011-05-05 NOTE — Assessment & Plan Note (Signed)
Finish her two month course

## 2011-05-05 NOTE — Assessment & Plan Note (Signed)
Continue prednisone taper per CDC watch for relapse with taper

## 2011-05-12 ENCOUNTER — Telehealth: Payer: Self-pay | Admitting: *Deleted

## 2011-05-12 MED ORDER — PREDNISONE 10 MG PO TABS: 20.0000 mg | ORAL_TABLET | ORAL | Status: DC

## 2011-05-12 NOTE — Telephone Encounter (Signed)
Pt reports that the rash reappeared when she decreased the dose of prednisone to 32m/day.  It was so bad that she changed back to 266mday 2 days ago.  She is now down to 1 days worth of prednisone.  The "study" medication ends on Thurs., March 7th.  Pt wanting additional prednisone to cover rash.  Pt [facility] at the [facility]. VaTommy Medallease advise.

## 2011-05-12 NOTE — Telephone Encounter (Signed)
PTS RASH WORSE WITH STEROID TAPER, NOW WORSE, GOING BACK UP TO 20MG A DAY FOR 10 DAYS, THEN 10 FOR 7 DAYS

## 2011-05-13 NOTE — Telephone Encounter (Signed)
Telephone call to pt.  Informed that Dr. Tommy Medal has continued the prednisone and that the rx is ready to pick up at [place], [place]  Pt verbalized understanding.

## 2011-05-19 ENCOUNTER — Ambulatory Visit (INDEPENDENT_AMBULATORY_CARE_PROVIDER_SITE_OTHER): Payer: BC Managed Care – PPO | Admitting: Infectious Disease

## 2011-05-19 ENCOUNTER — Encounter: Payer: Self-pay | Admitting: Infectious Disease

## 2011-05-19 DIAGNOSIS — T50904A Poisoning by unspecified drugs, medicaments and biological substances, undetermined, initial encounter: Secondary | ICD-10-CM

## 2011-05-19 DIAGNOSIS — L270 Generalized skin eruption due to drugs and medicaments taken internally: Secondary | ICD-10-CM

## 2011-05-19 DIAGNOSIS — B571 Acute Chagas' disease without heart involvement: Secondary | ICD-10-CM

## 2011-05-19 NOTE — Assessment & Plan Note (Signed)
Will be finished with therapy tomorrow

## 2011-05-19 NOTE — Assessment & Plan Note (Signed)
Proposed dose escalation of prednisone but pt refused. Will proceed with current taper. Benznidazole will be out of her system in next two days

## 2011-05-19 NOTE — Progress Notes (Signed)
Subjective:    Patient ID: Donna Holmes, female    DOB: 20-Jan-1951, 61 y.o.   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 I Obtained benznidazole from the [facility] and we started her on therapy at 174m bid on 03/22/11 to treat Donna Holmes She initially tolerated these meds without problems. However, on Feb the 4th she developed a fine "sandpaper" like rash on her abdomen that worsened after she went outside later in the week. Rash extended to her chest, back legs and arms. She had also been experiencing intense pruritis, made worse after she showers. She has no exfoliation, no lip or eyelid swelling and no mucosal involvement. I called the CDC and corresponded w ith them via email and phone later today. Per their recommendations we put her on a prednisone taper and she hadresponded very nicely. Unfortunately when she tapered down to 10 mg per day her rash again flared he went back to 20 mg per day but she still had persistence of a rash. As you should like up to the higher dose she is having difficulty tolerating the prednisone feeling jittery. She is taking Atarax and feels that her pruritus and the rash is currently manageable he has one more day left of of anti-parasitic drugs.  Review of Systems  Constitutional: Negative for fever, chills, diaphoresis, activity change, appetite change, fatigue and unexpected weight change.  HENT: Negative for congestion, sore throat, rhinorrhea, sneezing, trouble swallowing and sinus pressure.   Eyes: Negative for photophobia and visual disturbance.  Respiratory: Negative for cough, chest tightness, shortness of breath, wheezing and stridor.   Cardiovascular: Negative for chest pain, palpitations and leg swelling.  Gastrointestinal: Negative for nausea, vomiting, abdominal pain, diarrhea, constipation, blood in stool, abdominal distention and anal bleeding.  Genitourinary: Negative for dysuria, hematuria, flank pain and difficulty urinating.    Musculoskeletal: Negative for myalgias, back pain, joint swelling, arthralgias and gait problem.  Skin: Positive for rash. Negative for color change, pallor and wound.  Neurological: Negative for dizziness, tremors, weakness and light-headedness.  Hematological: Negative for adenopathy. Does not bruise/bleed easily.  Psychiatric/Behavioral: Negative for behavioral problems, confusion, sleep disturbance, dysphoric mood, decreased concentration and agitation.       Objective:   Physical Exam  Constitutional: She is oriented to person, place, and time. She appears well-developed and well-nourished. No distress.  HENT:  Head: Normocephalic and atraumatic.  Mouth/Throat: Oropharynx is clear and moist. No oropharyngeal exudate.  Eyes: Conjunctivae and EOM are normal. Pupils are equal, round, and reactive to light. No scleral icterus.  Neck: Normal range of motion. Neck supple. No JVD present.  Cardiovascular: Normal rate, regular rhythm and normal heart sounds.  Exam reveals no gallop and no friction rub.   No murmur heard. Pulmonary/Chest: Effort normal and breath sounds normal. No respiratory distress. She has no wheezes. She has no rales. She exhibits no tenderness.  Abdominal: She exhibits no distension and no mass. There is no tenderness. There is no rebound and no guarding.  Musculoskeletal: She exhibits no edema and no tenderness.  Lymphadenopathy:    She has no cervical adenopathy.  Neurological: She is alert and oriented to person, place, and time. She has normal reflexes. She exhibits normal muscle tone. Coordination normal.  Skin: Skin is warm and dry. She is not diaphoretic. No erythema. No pallor.          Is worse than last visit, simlar to when I saw her the first time with this  Psychiatric:  She has a normal mood and affect. Her behavior is normal. Judgment and thought content normal.          Assessment & Plan:  Drug rash Proposed dose escalation of prednisone but pt  refused. Will proceed with current taper. Benznidazole will be out of her system in next two days  Chagas' disease Will be finished with therapy tomorrow

## 2011-07-01 ENCOUNTER — Telehealth: Payer: Self-pay | Admitting: Infectious Disease

## 2011-07-01 NOTE — Telephone Encounter (Signed)
I need to see Donna Holmes in followup so that I can fill out paperwork to send to the [facility]. Can we set her up with an appt with me in May?

## 2011-07-01 NOTE — Telephone Encounter (Signed)
Sure, I have scheduled her for 5/21 @ 4:15pm.  Tamika: can you call the patient please? Thanks Roselyn Reef

## 2011-07-01 NOTE — Telephone Encounter (Signed)
Actually, she already has an appointment scheduled on 07/15/11 so I cancelled the 5/21 appt Thanks! Roselyn Reef

## 2011-07-15 ENCOUNTER — Encounter: Payer: Self-pay | Admitting: Infectious Disease

## 2011-07-15 ENCOUNTER — Ambulatory Visit (INDEPENDENT_AMBULATORY_CARE_PROVIDER_SITE_OTHER): Payer: BC Managed Care – PPO | Admitting: Infectious Disease

## 2011-07-15 VITALS — BP 100/44 | HR 78 | Temp 97.9°F | Ht 65.0 in | Wt 147.0 lb

## 2011-07-15 DIAGNOSIS — B571 Acute Chagas' disease without heart involvement: Secondary | ICD-10-CM

## 2011-07-15 DIAGNOSIS — L270 Generalized skin eruption due to drugs and medicaments taken internally: Secondary | ICD-10-CM

## 2011-07-15 DIAGNOSIS — T50904A Poisoning by unspecified drugs, medicaments and biological substances, undetermined, initial encounter: Secondary | ICD-10-CM

## 2011-07-15 NOTE — Progress Notes (Signed)
Subjective:    Patient ID: Donna Holmes, female    DOB: 1951/02/23, 61 y.o.   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 I Obtained benznidazole from the [facility] and we started her on therapy at 161m bid on 03/22/11 to treat Mrs MFobes She initially tolerated these meds without problems. However, on Feb the 4th she developed a fine "sandpaper" like rash on her abdomen that worsened after she went outside later in the week. Rash extended to her chest, back legs and arms. She had also been experiencing intense pruritis, made worse after she showers. She has no exfoliation, no lip or eyelid swelling and no mucosal involvement. I called the CDC and corresponded w ith them via email and phone later today. Per their recommendations we put her on a prednisone taper and she hadresponded very nicely. Unfortunately when she tapered down to 10 mg per day her rash again flared he went back to 20 mg per day but she still had persistence of a rash with difficulty tolerating the higher doses of prednisone due to jitteriness. She finished her benznidazole and steroids. She continued to have flares of rash and pruritis interimittently since then with exposure to hot water but no greater than 4 weeks later it has finally resolved. WE filled out form for the CDC together today.    Review of Systems  Constitutional: Negative for fever, chills, diaphoresis, activity change, appetite change, fatigue and unexpected weight change.  HENT: Negative for congestion, sore throat, rhinorrhea, sneezing, trouble swallowing and sinus pressure.   Eyes: Negative for photophobia and visual disturbance.  Respiratory: Negative for cough, chest tightness, shortness of breath, wheezing and stridor.   Cardiovascular: Negative for chest pain, palpitations and leg swelling.  Gastrointestinal: Negative for nausea, vomiting, abdominal pain, diarrhea, constipation, blood in stool, abdominal distention and anal bleeding.    Genitourinary: Negative for dysuria, hematuria, flank pain and difficulty urinating.  Musculoskeletal: Negative for myalgias, back pain, joint swelling, arthralgias and gait problem.  Skin: Negative for color change, pallor, rash and wound.  Neurological: Negative for dizziness, tremors, weakness and light-headedness.  Hematological: Negative for adenopathy. Does not bruise/bleed easily.  Psychiatric/Behavioral: Negative for behavioral problems, confusion, sleep disturbance, dysphoric mood, decreased concentration and agitation.       Objective:   Physical Exam  Constitutional: She is oriented to person, place, and time. She appears well-developed and well-nourished. No distress.  HENT:  Head: Normocephalic and atraumatic.  Mouth/Throat: Oropharynx is clear and moist. No oropharyngeal exudate.  Eyes: Conjunctivae and EOM are normal. Pupils are equal, round, and reactive to light. No scleral icterus.  Neck: Normal range of motion. Neck supple. No JVD present.  Cardiovascular: Normal rate, regular rhythm and normal heart sounds.  Exam reveals no gallop and no friction rub.   No murmur heard. Pulmonary/Chest: Effort normal and breath sounds normal. No respiratory distress. She has no wheezes. She has no rales. She exhibits no tenderness.  Abdominal: She exhibits no distension and no mass. There is no tenderness. There is no rebound and no guarding.  Musculoskeletal: She exhibits no edema and no tenderness.  Lymphadenopathy:    She has no cervical adenopathy.  Neurological: She is alert and oriented to person, place, and time. She has normal reflexes. She exhibits normal muscle tone. Coordination normal.  Skin: Skin is warm and dry. She is not diaphoretic. No erythema. No pallor.  Psychiatric: She has a normal mood and affect. Her behavior is normal. Judgment and thought  content normal.          Assessment & Plan:  Chagas' disease Sp full course of treatment  Drug rash Finally  resolved

## 2011-07-15 NOTE — Assessment & Plan Note (Signed)
Finally resolved

## 2011-07-15 NOTE — Assessment & Plan Note (Signed)
Sp full course of treatment

## 2011-08-03 ENCOUNTER — Ambulatory Visit: Payer: BC Managed Care – PPO | Admitting: Infectious Disease

## 2011-09-28 ENCOUNTER — Other Ambulatory Visit: Payer: Self-pay | Admitting: Obstetrics and Gynecology

## 2011-09-28 DIAGNOSIS — Z1231 Encounter for screening mammogram for malignant neoplasm of breast: Secondary | ICD-10-CM

## 2011-11-09 ENCOUNTER — Ambulatory Visit
Admission: RE | Admit: 2011-11-09 | Discharge: 2011-11-09 | Disposition: A | Discharge status: Home / Self Care | Payer: BC Managed Care – PPO | Source: Ambulatory Visit | Attending: Obstetrics and Gynecology | Admitting: Obstetrics and Gynecology

## 2011-11-09 DIAGNOSIS — Z1231 Encounter for screening mammogram for malignant neoplasm of breast: Secondary | ICD-10-CM

## 2011-11-10 ENCOUNTER — Encounter: Payer: Self-pay | Admitting: Infectious Disease

## 2012-09-13 ENCOUNTER — Other Ambulatory Visit: Payer: Self-pay

## 2012-09-13 DIAGNOSIS — Z1231 Encounter for screening mammogram for malignant neoplasm of breast: Secondary | ICD-10-CM

## 2012-11-10 ENCOUNTER — Ambulatory Visit
Admission: RE | Admit: 2012-11-10 | Discharge: 2012-11-10 | Disposition: A | Discharge status: Home / Self Care | Payer: BC Managed Care – PPO | Source: Ambulatory Visit

## 2012-11-10 DIAGNOSIS — Z1231 Encounter for screening mammogram for malignant neoplasm of breast: Secondary | ICD-10-CM

## 2013-01-16 ENCOUNTER — Ambulatory Visit (INDEPENDENT_AMBULATORY_CARE_PROVIDER_SITE_OTHER): Payer: BC Managed Care – PPO

## 2013-01-16 VITALS — BP 118/92 | HR 74 | Resp 12 | Ht 65.0 in | Wt 144.0 lb

## 2013-01-16 DIAGNOSIS — G5760 Lesion of plantar nerve, unspecified lower limb: Secondary | ICD-10-CM

## 2013-01-16 DIAGNOSIS — G5761 Lesion of plantar nerve, right lower limb: Secondary | ICD-10-CM

## 2013-01-16 DIAGNOSIS — R52 Pain, unspecified: Secondary | ICD-10-CM

## 2013-01-16 DIAGNOSIS — M2010 Hallux valgus (acquired), unspecified foot: Secondary | ICD-10-CM

## 2013-01-16 DIAGNOSIS — M2011 Hallux valgus (acquired), right foot: Secondary | ICD-10-CM

## 2013-01-16 NOTE — Patient Instructions (Signed)
Pre-Operative Instructions  Congratulations, you have decided to take an important step to improving your quality of life.  You can be assured that the doctors of [facility] will be with you every step of the way.  1. Plan to be at the surgery center/hospital at least 1 (one) hour prior to your scheduled time unless otherwise directed by the surgical center/hospital staff.  You must have a responsible adult accompany you, remain during the surgery and drive you home.  Make sure you have directions to the surgical center/hospital and know how to get there on time. 2. For hospital based surgery you will need to obtain a history and physical form from your family physician within 1 month prior to the date of surgery- we will give you a form for you primary physician.  3. We make every effort to accommodate the date you request for surgery.  There are however, times where surgery dates or times have to be moved.  We will contact you as soon as possible if a change in schedule is required.   4. No Aspirin/Ibuprofen for one week before surgery.  If you are on aspirin, any non-steroidal anti-inflammatory medications (Mobic, Aleve, Ibuprofen) you should stop taking it 7 days prior to your surgery.  You make take Tylenol  For pain prior to surgery.  5. Medications- If you are taking daily heart and blood pressure medications, seizure, reflux, allergy, asthma, anxiety, pain or diabetes medications, make sure the surgery center/hospital is aware before the day of surgery so they may notify you which medications to take or avoid the day of surgery. 6. No food or drink after midnight the night before surgery unless directed otherwise by surgical center/hospital staff. 7. No alcoholic beverages 24 hours prior to surgery.  No smoking 24 hours prior to or 24 hours after surgery. 8. Wear loose pants or shorts- loose enough to fit over bandages, boots, and casts. 9. No slip on shoes, sneakers are best. 10. Bring  your boot with you to the surgery center/hospital.  Also bring crutches or a walker if your physician has prescribed it for you.  If you do not have this equipment, it will be provided for you after surgery. 11. If you have not been contracted by the surgery center/hospital by the day before your surgery, call to confirm the date and time of your surgery. 12. Leave-time from work may vary depending on the type of surgery you have.  Appropriate arrangements should be made prior to surgery with your employer. 13. Prescriptions will be provided immediately following surgery by your doctor.  Have these filled as soon as possible after surgery and take the medication as directed. 14. Remove nail polish on the operative foot. 15. Wash the night before surgery.  The night before surgery wash the foot and leg well with the antibacterial soap provided and water paying special attention to beneath the toenails and in between the toes.  Rinse thoroughly with water and dry well with a towel.  Perform this wash unless told not to do so by your physician.  Enclosed: 1 Ice pack (please put in freezer the night before surgery)   1 Hibiclens skin cleaner   Pre-op Instructions  If you have any questions regarding the instructions, do not hesitate to call our office.  [place]: [address] , [place] [address] [phone]  [place]: [address] [place], [place] [address] [phone]  [place]: [address]  , [place] [address] [phone]  Dr. Rayvin Abid   Tuchman DPM, Dr. Ila Mcgill DPM Dr. Harriet Masson DPM, Dr. Lanelle Bal DPM, Dr. Trudie Buckler DPM

## 2013-01-16 NOTE — Progress Notes (Signed)
  Subjective:    Patient ID: Donna Holmes, female    DOB: 1950/08/03, 62 y.o.   MRN: [id number]  HPI Comments: '' THE RT FOOT IS HURTING AND HAVE BURNING SENSATION''  Foot Pain This is a new problem. The current episode started more than 1 year ago. The problem occurs daily. The problem has been unchanged. The symptoms are aggravated by walking. She has tried nothing for the symptoms. The treatment provided no relief.   patient has pain in the right forefoot between the second third MTP area on direct lateral compression this was noted 2 years ago was treated for suspect neuroma with injection provided relief for only 1 or 2 days. Patient is in should more permanent treatment at this time. Patient also complaining of the bunion pain and prominence of the first MTP area. Has difficulty with enclosed shoes and feels is also contributing to her forefoot pain and neuroma.    Review of Systems  Constitutional: Negative.   HENT: Negative.   Eyes: Negative.   Respiratory: Negative.   Cardiovascular: Negative.   Gastrointestinal: Negative.   Endocrine: Negative.   Genitourinary: Negative.   Musculoskeletal: Negative.   Skin: Negative.   Allergic/Immunologic: Negative.   Neurological: Negative.   Hematological: Negative.   Psychiatric/Behavioral: Negative.        Objective:   Physical Exam  Vitals reviewed. Constitutional: She is oriented to person, place, and time. She appears well-developed.  Cardiovascular:  Pulses:      Dorsalis pedis pulses are 2+ on the right side, and 2+ on the left side.       Posterior tibial pulses are 2+ on the right side, and 2+ on the left side.  Capillary refill time 3 seconds all digits. Skin temperature warm turgor normal no edema rubor or varicosities noted  Musculoskeletal:  Orthopedic biomechanical exam reveals rectus foot type with notable bunion deformity right foot x-rays reveal a mail 13 hallux abductus angle 25 sesamoid position 6-7. Patient  also has pain on direct lateral compression second interspace to lesser extent third interspace right foot. Left foot is unremarkable  Neurological: She is alert and oriented to person, place, and time. She has normal strength and normal reflexes.  Epicritic and proprioceptive sensations intact and symmetric bilateral. Normal plantar response and DTRs to  Skin: Skin is warm and dry. No cyanosis. Nails show no clubbing.  Skin color pigment and hair growth are normal. There is a punctate keratoses subsecond MTP area right consistent with a poor keratosis or verruca  Psychiatric: She has a normal mood and affect. Her behavior is normal. Thought content normal.          Assessment & Plan:  Assessment this time is persistent neuroma symptomology second interspace right. 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 Patient is in should a more definitive correction. Was given options of shoe changes and steroid injections which he declined at this time. Patient also declined alcohol sclerosing injections. At this time discussed surgical intervention for bunion correction as well as neurectomy second interspace the consent form for [facility] bunionectomy with screw fixation as well as neurectomy second interspace right foot was reviewed and signed. All questions asked medication are answered at this time. Surgery was scheduled at the [facility] specialty surgical center at her convenience. Will be followed up appropriately postoperatively.  Harriet Masson DPM

## 2013-02-05 DIAGNOSIS — M2010 Hallux valgus (acquired), unspecified foot: Secondary | ICD-10-CM

## 2013-02-05 DIAGNOSIS — G5760 Lesion of plantar nerve, unspecified lower limb: Secondary | ICD-10-CM

## 2013-02-13 ENCOUNTER — Ambulatory Visit (INDEPENDENT_AMBULATORY_CARE_PROVIDER_SITE_OTHER): Payer: BC Managed Care – PPO

## 2013-02-13 VITALS — BP 116/71 | HR 69 | Temp 97.7°F | Resp 20 | Ht 65.0 in | Wt 145.0 lb

## 2013-02-13 DIAGNOSIS — Z9889 Other specified postprocedural states: Secondary | ICD-10-CM

## 2013-02-13 DIAGNOSIS — G5761 Lesion of plantar nerve, right lower limb: Secondary | ICD-10-CM

## 2013-02-13 DIAGNOSIS — M2010 Hallux valgus (acquired), unspecified foot: Secondary | ICD-10-CM

## 2013-02-13 DIAGNOSIS — M2011 Hallux valgus (acquired), right foot: Secondary | ICD-10-CM

## 2013-02-13 DIAGNOSIS — G5760 Lesion of plantar nerve, unspecified lower limb: Secondary | ICD-10-CM

## 2013-02-13 NOTE — Patient Instructions (Signed)
ICE INSTRUCTIONS  Apply ice or cold pack to the affected area at least 3 times a day for 10-15 minutes each time.  You should also use ice after prolonged activity or vigorous exercise.  Do not apply ice longer than 20 minutes at one time.  Always keep a cloth between your skin and the ice pack to prevent burns.  Being consistent and following these instructions will help control your symptoms.  We suggest you purchase a gel ice pack because they are reusable and do bit leak.  Some of them are designed to wrap around the area.  Use the method that works best for you.  Here are some other suggestions for icing.   Use a frozen bag of peas or corn-inexpensive and molds well to your body, usually stays frozen for 10 to 20 minutes.  Wet a towel with cold water and squeeze out the excess until it's damp.  Place in a bag in the freezer for 20 minutes. Then remove and use.  Starting 2 weeks after surgery the dressings can be removed and you can resume normal bathing and showers.  At that point after dressings have been removed must maintain the anklet for compression stocking at all times when up and around. Most also maintain the air fracture boot for 4 more weeks to protect the surgical sites.

## 2013-02-13 NOTE — Progress Notes (Signed)
   Subjective:    Patient ID: Donna Holmes, female    DOB: 12-06-1950, 62 y.o.   MRN: [id number]  "It's doing okay, the Oxycontin kills my stomach.  So, I've been taking Advil and Tylenol."  HPI    Review of Systems no changes or new problems noted     Objective:   Physical Exam Neurovascular status is intact incisions clean dry well coapted. Dressings intact and dry patient is a day status post [place] bunionectomy right foot as was directly second interspace right foot. Capillary refill 3 seconds all digits intact epicritic sensation except for the second and third toes were patient describing numbness consistent with neurectomy. Mild edema and ecchymosis is noted consistent with postop course incision is well coapted at this time.       Assessment & Plan:  Assessment this time good postop progress following bunionectomy and neurectomy right foot at this time presto compressive dressing reapplied maintain air fracture boot as instructed reappointed 4 weeks for long-term followup and followup x-rays that time maintain moderate walking ambulation maintain the boot at all times did advise active passive range of motion exercises of the right great toe which were demonstrated to the patient recheck in one month for proxy 4 weeks for followup next  Harriet Masson DPM

## 2013-03-13 ENCOUNTER — Ambulatory Visit (INDEPENDENT_AMBULATORY_CARE_PROVIDER_SITE_OTHER): Payer: BC Managed Care – PPO

## 2013-03-13 VITALS — BP 110/74 | HR 96 | Resp 12

## 2013-03-13 DIAGNOSIS — R52 Pain, unspecified: Secondary | ICD-10-CM

## 2013-03-13 DIAGNOSIS — Q828 Other specified congenital malformations of skin: Secondary | ICD-10-CM

## 2013-03-13 DIAGNOSIS — Z9889 Other specified postprocedural states: Secondary | ICD-10-CM

## 2013-03-13 NOTE — Patient Instructions (Signed)
WARTS (Verrucae)  Warts are caused by a virus that has invaded the skin.  They are more common in young adults and children and a small percentage will resolve on their own.  There are many types of warts including mosaic warts (large flat), vulgaris (domed warts-have pearl like appearance), and plantar warts (flat or cauliflower like appearance).  Warts are highly contagious and may be picked up from any surface.  Warts thrive in a warm moist environment and are common near pools, showers, and locker room floors.  Any microscopic cut in the skin is where the virus enters and becomes a wart.  Warts are very difficult to treat and get rid of.  Patience is necessary in the treatment of this virus.  It may take months to cure and different methods may have to be used to get rid of your wart.  Standard Initial Treatment is: 1. Periodic debridement of the wart and application of Canthacur to each lesion (a blistering agent that will slough off the warty skin) 2. Dispensing of topical treatments/prescriptions to apply to the wart at home  Other options include: 1. Excision of the lesion-numbing the skin around the wart and cutting it out-requires daily soaks post-operatively and takes about 2-3 weeks to fully heal 2. Excision with CO2 Laser-Performed at the surgical center your foot is numbed up and the lesions are all cut out and then lasered with a high power laser.  Very good for multiple warts that are resistant. 3. Cimetidine (Tagamet)-Oral agent used in high does--has shown better results in children  How do I apply the standard topical treatments?  1. Salicylic Acid (Compound W wart remover liquid or gel-available at drug or grocery stores)-Apply a dime size thickness over the wart and cover with duct tape-apply at night so the medication does not spread out to the good skin.  The skin will turn white and slowly blister off.  Use a pumice stone daily to remove the white skin as best you can.  If  the skin gets too raw and painful, discontinue for a few days then resume. 2. Aldara (Imiquimod)-this is an immune response modifier.  They come in little packets so try to get at least 2 days out of each packet if you can.  Apply a small amount to the lesion and cover with duct tape.  Do not rub it in-let it absorb on its own.  Good to apply each morning.  Other Helpful Hints:  Wash shoes that can be washed in the washing machine 2-3 x per month with some bleach  Use Lysol in shoes that cannot be washed and wipe out with a cloth 1 x per week-allow to dry for 8 hours before wearing again  Use a bleach solution (1 part bleach to 3 parts water) in your tub or shower to reduce the spread of the virus to yourself and others  Use aqua socks or clean sandals when at the pool or locker room to reduce the chance of picking up the virus or spreading it to others  Follow the instructions for utilizing salicylic acid covering the area with duct tape once daily for 7-14 days.

## 2013-03-13 NOTE — Progress Notes (Signed)
   Subjective:    Patient ID: Donna Holmes, female    DOB: [birth year], 63 y.o.   MRN: [id number]  HPI Comments: ''THE RT FOOT DOING MUCH BETTER.''     Review of Systems no new changes or findings     Objective:   Physical Exam Neurovascular status is intact pedal pulses palpable bilateral. The incisions on the right foot first and second interspace are well coapted and healed suture toes are removed at this time. Maintain Neosporin her in the or are cocoa butter to the incisions as instructed. Patient has continued active passive range of motion exercises great toe joint. X-rays reveal good alignment of first MTP and intact pin fixation. Still some mild postoperative swelling no ecchymosis identified. Patient also is keratoses subsecond MTP area either vertigo or poor keratoses suggested topical salicylic acid with instructions been given.       Assessment & Plan:  Assessment good postop progress 1 month status post Austin bunionectomy right foot as well as directly second interspace right foot. Incision is well coapted and healing well maintain compression wrap may discontinue air fracture boot and return to come for walking tennis or athletic shoes. Reappointed in 2 months for long-term postop followup and x-rays at that time. In the interim we'll treat porokeratosis or verruca subsecond right with salicylic acid for 0-01 days utilizing.Duct tape for occlusion. Patient otherwise has minimal or no complaints little pain or discomfort noted minimal edema noted maintain moderate increasing activities over time recheck in 2 months for followup  Harriet Masson DPM

## 2013-03-19 NOTE — Progress Notes (Signed)
1) Austin bunionectomy with pin or screw fixation right foot 2) Neurectomy 2nd interspace right foot

## 2013-04-23 DIAGNOSIS — R7309 Other abnormal glucose: Secondary | ICD-10-CM | POA: Diagnosis not present

## 2013-04-23 DIAGNOSIS — R5381 Other malaise: Secondary | ICD-10-CM | POA: Diagnosis not present

## 2013-04-23 DIAGNOSIS — E7800 Pure hypercholesterolemia, unspecified: Secondary | ICD-10-CM | POA: Diagnosis not present

## 2013-04-23 DIAGNOSIS — E559 Vitamin D deficiency, unspecified: Secondary | ICD-10-CM | POA: Diagnosis not present

## 2013-04-23 DIAGNOSIS — E785 Hyperlipidemia, unspecified: Secondary | ICD-10-CM | POA: Diagnosis not present

## 2013-04-23 DIAGNOSIS — E348 Other specified endocrine disorders: Secondary | ICD-10-CM | POA: Diagnosis not present

## 2013-05-03 DIAGNOSIS — Z136 Encounter for screening for cardiovascular disorders: Secondary | ICD-10-CM | POA: Diagnosis not present

## 2013-05-03 DIAGNOSIS — Z0000 Encounter for general adult medical examination without abnormal findings: Secondary | ICD-10-CM | POA: Diagnosis not present

## 2013-05-03 DIAGNOSIS — Z6826 Body mass index (BMI) 26.0-26.9, adult: Secondary | ICD-10-CM | POA: Diagnosis not present

## 2013-05-03 DIAGNOSIS — E348 Other specified endocrine disorders: Secondary | ICD-10-CM | POA: Diagnosis not present

## 2013-05-03 DIAGNOSIS — M549 Dorsalgia, unspecified: Secondary | ICD-10-CM | POA: Diagnosis not present

## 2013-05-03 DIAGNOSIS — E7800 Pure hypercholesterolemia, unspecified: Secondary | ICD-10-CM | POA: Diagnosis not present

## 2013-05-03 DIAGNOSIS — R51 Headache: Secondary | ICD-10-CM | POA: Diagnosis not present

## 2013-05-03 DIAGNOSIS — E781 Pure hyperglyceridemia: Secondary | ICD-10-CM | POA: Diagnosis not present

## 2013-05-08 ENCOUNTER — Ambulatory Visit (INDEPENDENT_AMBULATORY_CARE_PROVIDER_SITE_OTHER): Payer: Medicare Other

## 2013-05-08 VITALS — BP 113/75 | HR 85 | Resp 16

## 2013-05-08 DIAGNOSIS — M2010 Hallux valgus (acquired), unspecified foot: Secondary | ICD-10-CM

## 2013-05-08 DIAGNOSIS — G5761 Lesion of plantar nerve, right lower limb: Secondary | ICD-10-CM

## 2013-05-08 DIAGNOSIS — Z09 Encounter for follow-up examination after completed treatment for conditions other than malignant neoplasm: Secondary | ICD-10-CM

## 2013-05-08 DIAGNOSIS — G5760 Lesion of plantar nerve, unspecified lower limb: Secondary | ICD-10-CM

## 2013-05-08 NOTE — Patient Instructions (Signed)
ICE INSTRUCTIONS  Apply ice or cold pack to the affected area at least 3 times a day for 10-15 minutes each time.  You should also use ice after prolonged activity or vigorous exercise.  Do not apply ice longer than 20 minutes at one time.  Always keep a cloth between your skin and the ice pack to prevent burns.  Being consistent and following these instructions will help control your symptoms.  We suggest you purchase a gel ice pack because they are reusable and do bit leak.  Some of them are designed to wrap around the area.  Use the method that works best for you.  Here are some other suggestions for icing.   Use a frozen bag of peas or corn-inexpensive and molds well to your body, usually stays frozen for 10 to 20 minutes.  Wet a towel with cold water and squeeze out the excess until it's damp.  Place in a bag in the freezer for 20 minutes. Then remove and use.  Continues compression stockings for leaks 2 or 3 more months to help reduce the swelling also continue with range of motion exercises of the great toe joint

## 2013-05-08 NOTE — Progress Notes (Signed)
   Subjective:    Patient ID: Donna Holmes, female    DOB: 02/17/1951, 63 y.o.   MRN: [id number]  HPI Comments: "Doing good, still wearing that sock he gave me"  DOS 02-05-2013 POV Austin bunionectomy right and Neurectomy 2nd interspace right     Review of Systems no new changes     Objective:   Physical Exam Neurovascular status is intact pedal pulses palpable there is no metatarsal pain on compression of the second or third interspaces. Patient still has a long second toe with some slight semirigid digital contracture hammertoe deformity. With some keratoses subsecond previously the side was more verrucoid rather than an IP K. Patient been using the topical salicylic acid as instructed with success. Continues to have some swelling and edema first MTP area consistent with postop course has relatively good range of motion possibly 45 dorsiflexion however devised continue with exercising range of motion and movement of the great toe joints and lesser joints as instructed. Also continue with compression stockings in the future. X-rays reveal good good consolidation and pin placements the osteotomy is well-healed consolidate       Assessment & Plan:  Assessment good postop progress both clinically and radiographically patient discharged Analiese Krupka to as-needed basis at this time maintain compression stockings also continue to exercises to improve range of motion followup in the future and as-needed basis any problems or recurrences or exacerbations. Maintain stable shoes at all times  Harriet Masson DPM

## 2013-05-30 ENCOUNTER — Other Ambulatory Visit: Payer: Self-pay | Admitting: Obstetrics and Gynecology

## 2013-05-30 DIAGNOSIS — R9389 Abnormal findings on diagnostic imaging of other specified body structures: Secondary | ICD-10-CM | POA: Diagnosis not present

## 2013-05-30 DIAGNOSIS — N840 Polyp of corpus uteri: Secondary | ICD-10-CM | POA: Diagnosis not present

## 2013-05-30 DIAGNOSIS — N950 Postmenopausal bleeding: Secondary | ICD-10-CM | POA: Diagnosis not present

## 2013-05-30 DIAGNOSIS — D259 Leiomyoma of uterus, unspecified: Secondary | ICD-10-CM | POA: Diagnosis not present

## 2013-07-20 DIAGNOSIS — K769 Liver disease, unspecified: Secondary | ICD-10-CM | POA: Diagnosis not present

## 2013-07-20 DIAGNOSIS — E785 Hyperlipidemia, unspecified: Secondary | ICD-10-CM | POA: Diagnosis not present

## 2013-07-20 DIAGNOSIS — E348 Other specified endocrine disorders: Secondary | ICD-10-CM | POA: Diagnosis not present

## 2013-07-20 DIAGNOSIS — R7309 Other abnormal glucose: Secondary | ICD-10-CM | POA: Diagnosis not present

## 2013-10-16 ENCOUNTER — Other Ambulatory Visit: Payer: Self-pay

## 2013-10-16 DIAGNOSIS — Z1231 Encounter for screening mammogram for malignant neoplasm of breast: Secondary | ICD-10-CM

## 2013-11-13 ENCOUNTER — Ambulatory Visit: Payer: BC Managed Care – PPO

## 2013-11-16 ENCOUNTER — Ambulatory Visit
Admission: RE | Admit: 2013-11-16 | Discharge: 2013-11-16 | Disposition: A | Discharge status: Home / Self Care | Payer: Medicare Other | Source: Ambulatory Visit

## 2013-11-16 DIAGNOSIS — Z1231 Encounter for screening mammogram for malignant neoplasm of breast: Secondary | ICD-10-CM

## 2013-11-21 DIAGNOSIS — H43819 Vitreous degeneration, unspecified eye: Secondary | ICD-10-CM | POA: Diagnosis not present

## 2014-01-29 ENCOUNTER — Encounter: Payer: Self-pay | Admitting: Internal Medicine

## 2014-01-29 ENCOUNTER — Other Ambulatory Visit (INDEPENDENT_AMBULATORY_CARE_PROVIDER_SITE_OTHER): Payer: Medicare Other

## 2014-01-29 ENCOUNTER — Ambulatory Visit (INDEPENDENT_AMBULATORY_CARE_PROVIDER_SITE_OTHER): Payer: Medicare Other | Admitting: Internal Medicine

## 2014-01-29 VITALS — BP 110/70 | HR 80 | Temp 98.2°F | Ht 65.0 in | Wt 145.2 lb

## 2014-01-29 DIAGNOSIS — J3089 Other allergic rhinitis: Secondary | ICD-10-CM | POA: Diagnosis not present

## 2014-01-29 DIAGNOSIS — E785 Hyperlipidemia, unspecified: Secondary | ICD-10-CM

## 2014-01-29 DIAGNOSIS — R5383 Other fatigue: Secondary | ICD-10-CM | POA: Diagnosis not present

## 2014-01-29 DIAGNOSIS — Z23 Encounter for immunization: Secondary | ICD-10-CM

## 2014-01-29 DIAGNOSIS — Z0001 Encounter for general adult medical examination with abnormal findings: Secondary | ICD-10-CM | POA: Insufficient documentation

## 2014-01-29 DIAGNOSIS — Z0000 Encounter for general adult medical examination without abnormal findings: Secondary | ICD-10-CM | POA: Insufficient documentation

## 2014-01-29 LAB — CBC WITH DIFFERENTIAL/PLATELET
BASOS ABS: 0.1 10*3/uL (ref 0.0–0.1)
Basophils Relative: 0.9 % (ref 0.0–3.0)
EOS PCT: 2.8 % (ref 0.0–5.0)
Eosinophils Absolute: 0.2 10*3/uL (ref 0.0–0.7)
HEMATOCRIT: 41.9 % (ref 36.0–46.0)
HEMOGLOBIN: 13.8 g/dL (ref 12.0–15.0)
LYMPHS ABS: 3.1 10*3/uL (ref 0.7–4.0)
LYMPHS PCT: 39.5 % (ref 12.0–46.0)
MCHC: 32.9 g/dL (ref 30.0–36.0)
MCV: 89.6 fl (ref 78.0–100.0)
MONOS PCT: 9.4 % (ref 3.0–12.0)
Monocytes Absolute: 0.7 10*3/uL (ref 0.1–1.0)
NEUTROS ABS: 3.7 10*3/uL (ref 1.4–7.7)
Neutrophils Relative %: 47.4 % (ref 43.0–77.0)
Platelets: 256 10*3/uL (ref 150.0–400.0)
RBC: 4.68 Mil/uL (ref 3.87–5.11)
RDW: 13.5 % (ref 11.5–15.5)
WBC: 7.9 10*3/uL (ref 4.0–10.5)

## 2014-01-29 LAB — URINALYSIS, ROUTINE W REFLEX MICROSCOPIC
Bilirubin Urine: NEGATIVE
Hgb urine dipstick: NEGATIVE
Ketones, ur: NEGATIVE
LEUKOCYTES UA: NEGATIVE
Nitrite: NEGATIVE
PH: 6.5 (ref 5.0–8.0)
RBC / HPF: NONE SEEN (ref 0–?)
Total Protein, Urine: NEGATIVE
UROBILINOGEN UA: 0.2 (ref 0.0–1.0)
Urine Glucose: NEGATIVE

## 2014-01-29 MED ORDER — PANTOPRAZOLE SODIUM 40 MG PO TBEC: 40.0000 mg | DELAYED_RELEASE_TABLET | Freq: Every day | ORAL | Status: DC

## 2014-01-29 NOTE — Progress Notes (Signed)
Subjective:    Patient ID: Donna Holmes, female    DOB: [birth year], 63 y.o.   MRN: [id number]  HPI  Here for yearly f/u;  Overall doing ok;  Pt denies CP, worsening SOB, DOE, wheezing, orthopnea, PND, worsening LE edema, palpitations, dizziness or syncope.  Pt denies neurological change such as new headache, facial or extremity weakness.  Pt denies polydipsia, polyuria, or low sugar symptoms. Pt states overall good compliance with treatment and medications, good tolerability, and has been trying to follow lower cholesterol diet.  Pt denies worsening depressive symptoms, suicidal ideation or panic. No fever, night sweats, wt loss, loss of appetite, or other constitutional symptoms.  Pt states good ability with ADL's, has low fall risk, home safety reviewed and adequate, no other significant changes in hearing or vision, and only occasionally active with exercise. Has had worsening reflux breakthreough on zantac 150 mg,but no other abd pain, dysphagia, n/v, bowel change or blood. Does c/o ongoing fatigue, but denies signficant daytime hypersomnolence.  Does have several wks ongoing nasal allergy symptoms with clearish congestion, itch and sneezing, without fever, pain, ST, cough, swelling or wheezing. Past Medical History  Diagnosis Date  . ALLERGIC RHINITIS 05/19/2009  . CAROTID ARTERY DISEASE 01/27/2010  . Headache(784.0) 05/19/2009  . 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 Bunionectomy Right   . Neuroma surgery Right     2nd interspace    reports that she has never smoked. She has never used smokeless tobacco. She reports that she does not drink alcohol or use illicit drugs. family history includes Alcohol abuse in her father; Hypertension in her mother; Prostate cancer in her maternal uncle. Allergies  Allergen Reactions  . Oxycontin [Oxycodone Hcl] Nausea Only and Other (See Comments)    FELT LIKE I WAS GOING TO FAINT   Current Outpatient  Prescriptions on File Prior to Visit  Medication Sig Dispense Refill  . BIOTIN PO Take 500 mg by mouth daily.     . calcium carbonate (OS-CAL) 600 MG TABS tablet Take 600 mg by mouth daily.    . cetirizine-pseudoephedrine (ZYRTEC-D) 5-120 MG per tablet Take 1 tablet by mouth once as needed for allergies.    . Cholecalciferol (VITAMIN D-3 PO) Take 4,000 Units by mouth daily.    . Nutritional Supplements (DHEA PO) Take 12.5 mg by mouth daily.    . Omega-3 Fatty Acids (FISH OIL) 1200 MG CAPS Take 1,200 mg by mouth daily.     No current facility-administered medications on file prior to visit.    Review of Systems Constitutional: Negative for increased diaphoresis, other activity, appetite or other siginficant weight change  HENT: Negative for worsening hearing loss, ear pain, facial swelling, mouth sores and neck stiffness.   Eyes: Negative for other worsening pain, redness or visual disturbance.  Respiratory: Negative for shortness of breath and wheezing.   Cardiovascular: Negative for chest pain and palpitations.  Gastrointestinal: Negative for diarrhea, blood in stool, abdominal distention or other pain Genitourinary: Negative for hematuria, flank pain or change in urine volume.  Musculoskeletal: Negative for myalgias or other joint complaints.  Skin: Negative for color change and wound.  Neurological: Negative for syncope and numbness. other than noted Hematological: Negative for adenopathy. or other swelling Psychiatric/Behavioral: Negative for hallucinations, self-injury, decreased concentration or other worsening agitation.      Objective:   Physical Exam BP 110/70 mmHg  Pulse 80  Temp(Src) 98.2 F (36.8 C) (Oral)  Ht 5'  5" (1.651 m)  Wt 145 lb 4 oz (65.885 kg)  BMI 24.17 kg/m2  SpO2 97% VS noted,  Constitutional: Pt is oriented to person, place, and time. Appears well-developed and well-nourished.  Head: Normocephalic and atraumatic.  Right Ear: External ear normal.    Left Ear: External ear normal.  Nose: Nose normal.  Mouth/Throat: Oropharynx is clear and moist.  Eyes: Conjjscript:void(0)unctivae and EOM are normal. Pupils are equal, round, and reactive to light.  Neck: Normal range of motion. Neck supple. No JVD present. No tracheal deviation present.  Cardiovascular: Normal rate, regular rhythm, normal heart sounds and intact distal pulses.   Pulmonary/Chest: Effort normal and breath sounds without rales or wheezing  Abdominal: Soft. Bowel sounds are normal. NT. No HSM  Musculoskeletal: Normal range of motion. Exhibits no edema.  Lymphadenopathy:  Has no cervical adenopathy.  Neurological: Pt is alert and oriented to person, place, and time. Pt has normal reflexes. No cranial nerve deficit. Motor grossly intact Skin: Skin is warm and dry. No rash noted.  Psychiatric:  Has normal mood and affect. Behavior is normal.     Assessment & Plan:

## 2014-01-29 NOTE — Assessment & Plan Note (Signed)
stable overall by history and exam,, and pt to continue medical treatment as before,  to f/u any worsening symptoms or concerns, for f/u labs, cont low chol diet

## 2014-01-29 NOTE — Assessment & Plan Note (Deleted)

## 2014-01-29 NOTE — Assessment & Plan Note (Signed)
Etiology unclear, Exam otherwise benign, to check labs as documented, follow with expectant management  

## 2014-01-29 NOTE — Patient Instructions (Signed)
You had the prevnar today (pneumonia shot)  Please take all new medication as prescribed - the protonix 40 mg per day  Please continue all other medications as before, and refills have been done if requested.  Please have the pharmacy call with any other refills you may need.  Please continue your efforts at being more active, low cholesterol diet, and weight control.  You are otherwise up to date with prevention measures today.  Please keep your appointments with your specialists as you may have planned  Please call if you change your mind about the colonoscopy  Please go to the LAB in the Basement (turn left off the elevator) for the tests to be done today  You will be contacted by phone if any changes need to be made immediately.  Otherwise, you will receive a letter about your results with an explanation, but please check with MyChart first.  Please remember to sign up for MyChart if you have not done so, as this will be important to you in the future with finding out test results, communicating by private email, and scheduling acute appointments online when needed.  Please return in 1 year for your yearly visit, or sooner if needed

## 2014-01-29 NOTE — Progress Notes (Signed)
Pre visit review using our clinic review tool, if applicable. No additional management support is needed unless otherwise documented below in the visit note.

## 2014-01-29 NOTE — Assessment & Plan Note (Signed)
Mild, for allegra otc prn,  to f/u any worsening symptoms or concerns

## 2014-01-30 LAB — BASIC METABOLIC PANEL
BUN: 16 mg/dL (ref 6–23)
CALCIUM: 9.8 mg/dL (ref 8.4–10.5)
CO2: 26 mEq/L (ref 19–32)
Chloride: 105 mEq/L (ref 96–112)
Creatinine, Ser: 0.8 mg/dL (ref 0.4–1.2)
GFR: 78.58 mL/min (ref >60.00)
GLUCOSE: 89 mg/dL (ref 70–99)
POTASSIUM: 4.1 meq/L (ref 3.5–5.1)
SODIUM: 140 meq/L (ref 135–145)

## 2014-01-30 LAB — HEPATIC FUNCTION PANEL
ALBUMIN: 4.7 g/dL (ref 3.5–5.2)
ALT: 20 U/L (ref 0–35)
AST: 18 U/L (ref 0–37)
Alkaline Phosphatase: 53 U/L (ref 39–117)
Bilirubin, Direct: 0 mg/dL (ref 0.0–0.3)
TOTAL PROTEIN: 7.7 g/dL (ref 6.0–8.3)
Total Bilirubin: 0.4 mg/dL (ref 0.2–1.2)

## 2014-01-30 LAB — LIPID PANEL
CHOLESTEROL: 199 mg/dL (ref 0–200)
HDL: 36.3 mg/dL — AB (ref >39.00)
NonHDL: 162.7
TRIGLYCERIDES: 205 mg/dL — AB (ref 0.0–149.0)
Total CHOL/HDL Ratio: 5
VLDL: 41 mg/dL — AB (ref 0.0–40.0)

## 2014-01-30 LAB — LDL CHOLESTEROL, DIRECT: Direct LDL: 128.2 mg/dL

## 2014-01-30 LAB — TSH: TSH: 0.99 u[IU]/mL (ref 0.35–4.50)

## 2014-01-31 ENCOUNTER — Other Ambulatory Visit: Payer: Self-pay | Admitting: Internal Medicine

## 2014-01-31 ENCOUNTER — Encounter: Payer: Self-pay | Admitting: Internal Medicine

## 2014-01-31 MED ORDER — PRAVASTATIN SODIUM 20 MG PO TABS: 20.0000 mg | ORAL_TABLET | Freq: Every day | ORAL | Status: DC

## 2014-02-19 ENCOUNTER — Encounter: Payer: Self-pay | Admitting: Internal Medicine

## 2014-02-19 MED ORDER — PANTOPRAZOLE SODIUM 40 MG PO TBEC
40.0000 mg | DELAYED_RELEASE_TABLET | Freq: Every day | ORAL | Status: DC
Start: 2014-02-19 — End: 2015-07-09

## 2014-04-02 DIAGNOSIS — Z01419 Encounter for gynecological examination (general) (routine) without abnormal findings: Secondary | ICD-10-CM | POA: Diagnosis not present

## 2014-04-02 DIAGNOSIS — N819 Female genital prolapse, unspecified: Secondary | ICD-10-CM | POA: Diagnosis not present

## 2014-05-01 ENCOUNTER — Encounter (HOSPITAL_COMMUNITY): Payer: Self-pay

## 2014-05-01 ENCOUNTER — Encounter (HOSPITAL_COMMUNITY)
Admission: RE | Admit: 2014-05-01 | Discharge: 2014-05-01 | Disposition: A | Discharge status: Home / Self Care | Payer: Medicare Other | Source: Ambulatory Visit | Attending: Obstetrics and Gynecology | Admitting: Obstetrics and Gynecology

## 2014-05-01 ENCOUNTER — Other Ambulatory Visit: Payer: Self-pay

## 2014-05-01 DIAGNOSIS — Z01810 Encounter for preprocedural cardiovascular examination: Secondary | ICD-10-CM | POA: Insufficient documentation

## 2014-05-01 DIAGNOSIS — N814 Uterovaginal prolapse, unspecified: Secondary | ICD-10-CM | POA: Diagnosis not present

## 2014-05-01 DIAGNOSIS — D259 Leiomyoma of uterus, unspecified: Secondary | ICD-10-CM | POA: Insufficient documentation

## 2014-05-01 DIAGNOSIS — Z01812 Encounter for preprocedural laboratory examination: Secondary | ICD-10-CM | POA: Diagnosis not present

## 2014-05-01 HISTORY — DX: Gastro-esophageal reflux disease without esophagitis: K21.9

## 2014-05-01 LAB — CBC
HCT: 40.5 % (ref 36.0–46.0)
Hemoglobin: 13.5 g/dL (ref 12.0–15.0)
MCH: 29.7 pg (ref 26.0–34.0)
MCHC: 33.3 g/dL (ref 30.0–36.0)
MCV: 89.2 fL (ref 78.0–100.0)
PLATELETS: 242 10*3/uL (ref 150–400)
RBC: 4.54 MIL/uL (ref 3.87–5.11)
RDW: 13.4 % (ref 11.5–15.5)
WBC: 6.7 10*3/uL (ref 4.0–10.5)

## 2014-05-01 NOTE — Patient Instructions (Addendum)
   Your procedure is scheduled on:  May 16 2014 AT 730AM  Enter through the Main Entrance of [facility] at: 6AM  Pick up the phone at the desk and dial [phone] and inform us of your arrival.  Please call this number if you have any problems the morning of surgery: [phone]  Remember: DO NOT EAT FOOD OR [place] MIDNIGHT MARCH 2    Take these medicines the morning of surgery with a SIP OF WATER:  TAKE PROTONIX DAY OF SURGERY  Do not wear jewelry, make-up, or FINGER nail polish No metal in your hair or on your body. Do not wear lotions, powders, perfumes.  You may wear deodorant.  Do not bring valuables to the hospital. Contacts, dentures or bridgework may not be worn into surgery.  Leave suitcase in the car. After Surgery it may be brought to your room. For patients being admitted to the hospital, checkout time is 11:00am the day of discharge.    Patients discharged on the day of surgery will not be allowed to drive home.

## 2014-05-07 DIAGNOSIS — N8111 Cystocele, midline: Secondary | ICD-10-CM | POA: Diagnosis not present

## 2014-05-07 DIAGNOSIS — N816 Rectocele: Secondary | ICD-10-CM | POA: Diagnosis not present

## 2014-05-15 NOTE — H&P (Addendum)
Donna Holmes is complaining of worsening problems with uterine prolapse. A lot of times when she stands, squats, or lifts, she will find that the cervix and cystocele actually fall out. She has to replace her uterus manually in order to go to the bathroom to urinate. It has now become a problem that she deals with on a daily basis. She is continuing with bioidentical estrogen/progesterone combination, has not really had any post menopausal bleeding recently. She has had an endometrial biopsy and has been evaluated in the past for this, but she continues this through a holistic doctor actually out of [place].  O: Physical exam: General: Alert and oriented. Normocephalic and atraumatic. No thyromegaly or masses palpated. Heart is regular rate and rhythm without murmur. Lungs are clear to auscultation bilaterally. Breasts without masses, lymphadenopathy or discharge. Abdomen is soft, nontender, nondistended. No rebound or guarding. No flank pain is noted. Extremities without clubbing, cyanosis or edema. Normal external genitalia, Bartholin, Skene's and urethra. She has a 3rd-degree cystocele, 3rd-degree rectocele, uterine prolapse to the introitus with Valsalva. Uterus is retroverted, mobile, nontender. No adnexal masses palpable. No inguinal lymphadenopathy palpable. A/P:Donna Holmes is complaining of worsening problems with uterine prolapse. A lot of times when she stands, squats, or lifts, she will find that the cervix and cystocele actually fall out. She has to replace her uterus manually in order to go to the bathroom to urinate. It has now become a problem that she deals with on a daily basis. She is continuing with bioidentical estrogen/progesterone combination, has not really had any post menopausal bleeding recently. She has had an endometrial biopsy and has been evaluated in the past for this, but she continues this through a holistic doctor actually out of [place].  O: Physical exam:  General: Alert and oriented. Normocephalic and atraumatic. No thyromegaly or masses palpated. Heart is regular rate and rhythm without murmur. Lungs are clear to auscultation bilaterally. Breasts without masses, lymphadenopathy or discharge. Abdomen is soft, nontender, nondistended. No rebound or guarding. No flank pain is noted. Extremities without clubbing, cyanosis or edema. Normal external genitalia, Bartholin, Skene's and urethra. She has a 3rd-degree cystocele, 3rd-degree rectocele, uterine prolapse to the introitus with Valsalva. Uterus is retroverted, mobile, nontender. No adnexal masses palpable. No inguinal lymphadenopathy palpable. A/P: symptomatic prolapse of the uterus, cystocele, and rectocele, discussed different options such as expectant management, pessary, or surgical options. She finds that it is interfering with her day to day life and she wants to proceed with surgical options. Plan LAVH-BSalpingectomy, A&P repair with sacrospinous ligament suspension. I did briefly discuss the procedure and its recovery at length. We will go ahead and schedule this. discussed different options such as expectant management, pessary, or surgical options. She finds that it is interfering with her day to day life and she wants to proceed with surgical options. Plan LAVH-Bilateral Salpingectomy, A&P repair with sacrospinous ligament suspension.Pt wants to preserve her ovaries despite being menopausal as she wants to preserve any hormonal contribution they may have.  I had recommended removal to lower risk of ovarian cancer but she has researched this and wants them to remain.  I did discuss the risks, benefits, pros and cons and discussed the recovery at length.  Also discussed the risk of bladder or ureter injury, incontinence, or need for self cath.  Her questions were answered and she gives her informed consent.   05/16/14 0715 This patient has been seen and examined.   All of her  questions were answered.  Labs and vital  signs reviewed.  Informed consent has been obtained.  The History and Physical is current.This patient has been seen and examined.   All of her questions were answered.  Labs and vital signs reviewed.  Informed consent has been obtained.  The History and Physical is current. DL

## 2014-05-16 ENCOUNTER — Observation Stay (HOSPITAL_COMMUNITY)
Admission: RE | Admit: 2014-05-16 | Discharge: 2014-05-18 | Disposition: A | Discharge status: Home / Self Care | Payer: Medicare Other | Source: Ambulatory Visit | Attending: Obstetrics and Gynecology | Admitting: Obstetrics and Gynecology

## 2014-05-16 ENCOUNTER — Encounter (HOSPITAL_COMMUNITY)
Admission: RE | Disposition: A | Discharge status: Home / Self Care | Payer: Self-pay | Source: Ambulatory Visit | Attending: Obstetrics and Gynecology

## 2014-05-16 ENCOUNTER — Encounter (HOSPITAL_COMMUNITY): Payer: Self-pay | Admitting: *Deleted

## 2014-05-16 ENCOUNTER — Inpatient Hospital Stay (HOSPITAL_COMMUNITY): Payer: Medicare Other | Admitting: Anesthesiology

## 2014-05-16 DIAGNOSIS — D259 Leiomyoma of uterus, unspecified: Secondary | ICD-10-CM | POA: Insufficient documentation

## 2014-05-16 DIAGNOSIS — N814 Uterovaginal prolapse, unspecified: Principal | ICD-10-CM | POA: Insufficient documentation

## 2014-05-16 DIAGNOSIS — Z90710 Acquired absence of both cervix and uterus: Secondary | ICD-10-CM | POA: Diagnosis present

## 2014-05-16 HISTORY — PX: LAPAROSCOPIC ASSISTED VAGINAL HYSTERECTOMY: SHX5398

## 2014-05-16 HISTORY — PX: ANTERIOR AND POSTERIOR REPAIR WITH SACROSPINOUS FIXATION: SHX6536

## 2014-05-16 SURGERY — HYSTERECTOMY, VAGINAL, LAPAROSCOPY-ASSISTED
Anesthesia: General | Site: Vagina

## 2014-05-16 MED ORDER — BUPIVACAINE-EPINEPHRINE (PF) 0.25% -1:200000 IJ SOLN
INTRAMUSCULAR | Status: AC
  Filled 2014-05-16: qty 30

## 2014-05-16 MED ORDER — GLYCOPYRROLATE 0.2 MG/ML IJ SOLN
INTRAMUSCULAR | Status: AC
  Filled 2014-05-16: qty 4

## 2014-05-16 MED ORDER — TRAMADOL HCL 50 MG PO TABS
50.0000 mg | ORAL_TABLET | Freq: Four times a day (QID) | ORAL | Status: DC
  Administered 2014-05-16 – 2014-05-18 (×9): 50 mg via ORAL
  Filled 2014-05-16 (×9): qty 1

## 2014-05-16 MED ORDER — PHENYLEPHRINE HCL 10 MG/ML IJ SOLN: INTRAMUSCULAR | Status: DC | PRN

## 2014-05-16 MED ORDER — DEXAMETHASONE SODIUM PHOSPHATE 10 MG/ML IJ SOLN
INTRAMUSCULAR | Status: AC
  Filled 2014-05-16: qty 1

## 2014-05-16 MED ORDER — SCOPOLAMINE 1 MG/3DAYS TD PT72: 1.0000 | MEDICATED_PATCH | Freq: Once | TRANSDERMAL | Status: DC

## 2014-05-16 MED ORDER — ESTRADIOL 0.1 MG/GM VA CREA
TOPICAL_CREAM | VAGINAL | Status: DC | PRN
  Administered 2014-05-16: 1 via VAGINAL

## 2014-05-16 MED ORDER — ONDANSETRON HCL 4 MG/2ML IJ SOLN
INTRAMUSCULAR | Status: AC
  Filled 2014-05-16: qty 2

## 2014-05-16 MED ORDER — ROCURONIUM BROMIDE 100 MG/10ML IV SOLN
INTRAVENOUS | Status: AC
  Filled 2014-05-16: qty 1

## 2014-05-16 MED ORDER — HYDROMORPHONE 0.3 MG/ML IV SOLN
INTRAVENOUS | Status: DC
  Administered 2014-05-16: 12:00:00 via INTRAVENOUS
  Administered 2014-05-16: 0.2 mg via INTRAVENOUS
  Administered 2014-05-16: 0.799 mg via INTRAVENOUS
  Filled 2014-05-16: qty 25

## 2014-05-16 MED ORDER — LACTATED RINGERS IV SOLN
INTRAVENOUS | Status: DC
  Administered 2014-05-16 (×4): via INTRAVENOUS

## 2014-05-16 MED ORDER — SCOPOLAMINE 1 MG/3DAYS TD PT72
1.0000 | MEDICATED_PATCH | Freq: Once | TRANSDERMAL | Status: DC
  Administered 2014-05-16: 1.5 mg via TRANSDERMAL
  Filled 2014-05-16: qty 1

## 2014-05-16 MED ORDER — BUPIVACAINE HCL (PF) 0.25 % IJ SOLN
INTRAMUSCULAR | Status: AC
  Filled 2014-05-16: qty 30

## 2014-05-16 MED ORDER — NEOSTIGMINE METHYLSULFATE 10 MG/10ML IV SOLN
INTRAVENOUS | Status: AC
  Filled 2014-05-16: qty 1

## 2014-05-16 MED ORDER — MENTHOL 3 MG MT LOZG: 1.0000 | LOZENGE | OROMUCOSAL | Status: DC | PRN

## 2014-05-16 MED ORDER — HYDROMORPHONE HCL 1 MG/ML IJ SOLN
INTRAMUSCULAR | Status: DC | PRN
  Administered 2014-05-16: 0.5 mg via INTRAVENOUS

## 2014-05-16 MED ORDER — PHENYLEPHRINE 40 MCG/ML (10ML) SYRINGE FOR IV PUSH (FOR BLOOD PRESSURE SUPPORT)
PREFILLED_SYRINGE | INTRAVENOUS | Status: AC
  Filled 2014-05-16: qty 10

## 2014-05-16 MED ORDER — DEXAMETHASONE SODIUM PHOSPHATE 10 MG/ML IJ SOLN
INTRAMUSCULAR | Status: DC | PRN
  Administered 2014-05-16: 10 mg via INTRAVENOUS

## 2014-05-16 MED ORDER — HYDROMORPHONE HCL 1 MG/ML IJ SOLN
0.2500 mg | INTRAMUSCULAR | Status: DC | PRN
  Administered 2014-05-16 (×2): 0.5 mg via INTRAVENOUS

## 2014-05-16 MED ORDER — HYDROMORPHONE HCL 1 MG/ML IJ SOLN
INTRAMUSCULAR | Status: AC
Start: 2014-05-16 — End: 2014-05-16
  Filled 2014-05-16: qty 1

## 2014-05-16 MED ORDER — DEXTROSE 5 % IV SOLN
2.0000 g | INTRAVENOUS | Status: AC
  Administered 2014-05-16: 2 g via INTRAVENOUS
  Filled 2014-05-16: qty 2

## 2014-05-16 MED ORDER — HYDROMORPHONE HCL 1 MG/ML IJ SOLN: 0.2000 mg | INTRAMUSCULAR | Status: DC | PRN

## 2014-05-16 MED ORDER — HYDROMORPHONE HCL 1 MG/ML IJ SOLN
INTRAMUSCULAR | Status: AC
  Filled 2014-05-16: qty 1

## 2014-05-16 MED ORDER — IBUPROFEN 600 MG PO TABS
600.0000 mg | ORAL_TABLET | Freq: Four times a day (QID) | ORAL | Status: DC | PRN
  Administered 2014-05-17: 600 mg via ORAL
  Filled 2014-05-16: qty 1

## 2014-05-16 MED ORDER — LIDOCAINE HCL (CARDIAC) 20 MG/ML IV SOLN
INTRAVENOUS | Status: DC | PRN
  Administered 2014-05-16: 60 mg via INTRAVENOUS

## 2014-05-16 MED ORDER — ROCURONIUM BROMIDE 100 MG/10ML IV SOLN
INTRAVENOUS | Status: DC | PRN
  Administered 2014-05-16: 40 mg via INTRAVENOUS
  Administered 2014-05-16: 10 mg via INTRAVENOUS

## 2014-05-16 MED ORDER — NALOXONE HCL 0.4 MG/ML IJ SOLN: 0.4000 mg | INTRAMUSCULAR | Status: DC | PRN

## 2014-05-16 MED ORDER — NEOSTIGMINE METHYLSULFATE 10 MG/10ML IV SOLN
INTRAVENOUS | Status: DC | PRN
  Administered 2014-05-16: 3 mg via INTRAVENOUS

## 2014-05-16 MED ORDER — DIPHENHYDRAMINE HCL 12.5 MG/5ML PO ELIX: 12.5000 mg | ORAL_SOLUTION | Freq: Four times a day (QID) | ORAL | Status: DC | PRN

## 2014-05-16 MED ORDER — LIDOCAINE HCL (CARDIAC) 20 MG/ML IV SOLN
INTRAVENOUS | Status: AC
  Filled 2014-05-16: qty 5

## 2014-05-16 MED ORDER — DEXTROSE-NACL 5-0.45 % IV SOLN
INTRAVENOUS | Status: DC
  Administered 2014-05-16 – 2014-05-17 (×3): via INTRAVENOUS

## 2014-05-16 MED ORDER — BUPIVACAINE HCL (PF) 0.25 % IJ SOLN
INTRAMUSCULAR | Status: DC | PRN
  Administered 2014-05-16: 10 mL

## 2014-05-16 MED ORDER — FENTANYL CITRATE 0.05 MG/ML IJ SOLN
INTRAMUSCULAR | Status: DC | PRN
  Administered 2014-05-16 (×5): 50 ug via INTRAVENOUS

## 2014-05-16 MED ORDER — DEXAMETHASONE SODIUM PHOSPHATE 4 MG/ML IJ SOLN
INTRAMUSCULAR | Status: AC
  Filled 2014-05-16: qty 1

## 2014-05-16 MED ORDER — ESTRADIOL 0.1 MG/GM VA CREA
TOPICAL_CREAM | VAGINAL | Status: AC
  Filled 2014-05-16: qty 42.5

## 2014-05-16 MED ORDER — DIPHENHYDRAMINE HCL 50 MG/ML IJ SOLN: 12.5000 mg | Freq: Four times a day (QID) | INTRAMUSCULAR | Status: DC | PRN

## 2014-05-16 MED ORDER — SODIUM CHLORIDE 0.9 % IJ SOLN: 9.0000 mL | INTRAMUSCULAR | Status: DC | PRN

## 2014-05-16 MED ORDER — GLYCOPYRROLATE 0.2 MG/ML IJ SOLN
INTRAMUSCULAR | Status: DC | PRN
  Administered 2014-05-16: 0.6 mg via INTRAVENOUS

## 2014-05-16 MED ORDER — ONDANSETRON HCL 4 MG/2ML IJ SOLN
INTRAMUSCULAR | Status: DC | PRN
  Administered 2014-05-16: 4 mg via INTRAVENOUS

## 2014-05-16 MED ORDER — MIDAZOLAM HCL 2 MG/2ML IJ SOLN
INTRAMUSCULAR | Status: DC | PRN
Start: 2014-05-16 — End: 2014-05-16
  Administered 2014-05-16: 2 mg via INTRAVENOUS

## 2014-05-16 MED ORDER — PROPOFOL 10 MG/ML IV BOLUS
INTRAVENOUS | Status: DC | PRN
  Administered 2014-05-16: 150 mg via INTRAVENOUS

## 2014-05-16 MED ORDER — PROPOFOL 10 MG/ML IV BOLUS
INTRAVENOUS | Status: AC
  Filled 2014-05-16: qty 20

## 2014-05-16 MED ORDER — PHENYLEPHRINE 40 MCG/ML (10ML) SYRINGE FOR IV PUSH (FOR BLOOD PRESSURE SUPPORT)
PREFILLED_SYRINGE | INTRAVENOUS | Status: DC | PRN
  Administered 2014-05-16 (×3): 40 ug via INTRAVENOUS
  Administered 2014-05-16: 80 ug via INTRAVENOUS
  Administered 2014-05-16 (×2): 40 ug via INTRAVENOUS
  Administered 2014-05-16: 80 ug via INTRAVENOUS

## 2014-05-16 MED ORDER — PANTOPRAZOLE SODIUM 40 MG PO TBEC
40.0000 mg | DELAYED_RELEASE_TABLET | Freq: Every day | ORAL | Status: DC
  Administered 2014-05-17 – 2014-05-18 (×2): 40 mg via ORAL
  Filled 2014-05-16 (×2): qty 1

## 2014-05-16 MED ORDER — MIDAZOLAM HCL 2 MG/2ML IJ SOLN
INTRAMUSCULAR | Status: AC
  Filled 2014-05-16: qty 2

## 2014-05-16 MED ORDER — FENTANYL CITRATE 0.05 MG/ML IJ SOLN
INTRAMUSCULAR | Status: AC
  Filled 2014-05-16: qty 5

## 2014-05-16 MED ORDER — ONDANSETRON HCL 4 MG/2ML IJ SOLN: 4.0000 mg | Freq: Once | INTRAMUSCULAR | Status: DC | PRN

## 2014-05-16 MED ORDER — ONDANSETRON HCL 4 MG/2ML IJ SOLN
4.0000 mg | Freq: Four times a day (QID) | INTRAMUSCULAR | Status: DC | PRN
Start: 2014-05-16 — End: 2014-05-18
  Administered 2014-05-16: 4 mg via INTRAVENOUS
  Filled 2014-05-16: qty 2

## 2014-05-16 SURGICAL SUPPLY — 56 items
BENZOIN TINCTURE PRP APPL 2/3 (GAUZE/BANDAGES/DRESSINGS) ×3 IMPLANT
BLADE SURG 15 STRL LF C SS BP (BLADE) ×2 IMPLANT
BLADE SURG 15 STRL SS (BLADE) ×1
CABLE HIGH FREQUENCY MONO STRZ (ELECTRODE) IMPLANT
CATH ROBINSON RED A/P 16FR (CATHETERS) ×3 IMPLANT
CLOTH BEACON ORANGE TIMEOUT ST (SAFETY) ×3 IMPLANT
CONT PATH 16OZ SNAP LID 3702 (MISCELLANEOUS) ×3 IMPLANT
COVER BACK TABLE 60X90IN (DRAPES) ×3 IMPLANT
DECANTER SPIKE VIAL GLASS SM (MISCELLANEOUS) IMPLANT
DEVICE CAPIO SLIM SINGLE (INSTRUMENTS) IMPLANT
DRSG COVADERM PLUS 2X2 (GAUZE/BANDAGES/DRESSINGS) ×3 IMPLANT
DRSG OPSITE POSTOP 3X4 (GAUZE/BANDAGES/DRESSINGS) ×3 IMPLANT
DURAPREP 26ML APPLICATOR (WOUND CARE) IMPLANT
ELECT LIGASURE LONG (ELECTRODE) ×3 IMPLANT
ELECT REM PT RETURN 9FT ADLT (ELECTROSURGICAL)
ELECTRODE REM PT RTRN 9FT ADLT (ELECTROSURGICAL) IMPLANT
FORCEPS CUTTING 45CM 5MM (CUTTING FORCEPS) ×3 IMPLANT
GAUZE PACKING 2X5 YD STRL (GAUZE/BANDAGES/DRESSINGS) ×3 IMPLANT
GLOVE BIO SURGEON STRL SZ8 (GLOVE) ×3 IMPLANT
GLOVE BIOGEL PI IND STRL 6.5 (GLOVE) ×2 IMPLANT
GLOVE BIOGEL PI IND STRL 7.0 (GLOVE) ×4 IMPLANT
GLOVE BIOGEL PI INDICATOR 6.5 (GLOVE) ×1
GLOVE BIOGEL PI INDICATOR 7.0 (GLOVE) ×2
GLOVE SURG ORTHO 8.0 STRL STRW (GLOVE) ×9 IMPLANT
GOWN STRL REUS W/TWL LRG LVL3 (GOWN DISPOSABLE) ×12 IMPLANT
LIQUID BAND (GAUZE/BANDAGES/DRESSINGS) ×3 IMPLANT
NEEDLE HYPO 22GX1.5 SAFETY (NEEDLE) IMPLANT
NEEDLE INSUFFLATION 120MM (ENDOMECHANICALS) ×3 IMPLANT
NEEDLE MAYO 6 CRC TAPER PT (NEEDLE) IMPLANT
NEEDLE SPNL 22GX3.5 QUINCKE BK (NEEDLE) IMPLANT
NS IRRIG 1000ML POUR BTL (IV SOLUTION) ×3 IMPLANT
PACK LAVH (CUSTOM PROCEDURE TRAY) ×3 IMPLANT
PACK ROBOTIC GOWN (GOWN DISPOSABLE) ×3 IMPLANT
PACK VAGINAL WOMENS (CUSTOM PROCEDURE TRAY) ×3 IMPLANT
PAD POSITIONER PINK NONSTERILE (MISCELLANEOUS) ×3 IMPLANT
PROTECTOR NERVE ULNAR (MISCELLANEOUS) ×3 IMPLANT
SET IRRIG TUBING LAPAROSCOPIC (IRRIGATION / IRRIGATOR) IMPLANT
SOLUTION ELECTROLUBE (MISCELLANEOUS) IMPLANT
STRIP CLOSURE SKIN 1/4X3 (GAUZE/BANDAGES/DRESSINGS) ×3 IMPLANT
SUT CAPIO ETHIBPND (SUTURE) IMPLANT
SUT MNCRL 0 MO-4 VIOLET 18 CR (SUTURE) ×4 IMPLANT
SUT MNCRL 0 VIOLET 6X18 (SUTURE) ×2 IMPLANT
SUT MNCRL AB 0 CT1 27 (SUTURE) IMPLANT
SUT MON AB 2-0 CT1 27 (SUTURE) ×12 IMPLANT
SUT MON AB 2-0 CT1 36 (SUTURE) IMPLANT
SUT MONOCRYL 0 6X18 (SUTURE) ×1
SUT MONOCRYL 0 MO 4 18  CR/8 (SUTURE) ×2
SUT PDS AB 0 CT1 27 (SUTURE) IMPLANT
SUT VICRYL 0 UR6 27IN ABS (SUTURE) ×3 IMPLANT
SUT VICRYL RAPIDE 3 0 (SUTURE) ×3 IMPLANT
TOWEL OR 17X24 6PK STRL BLUE (TOWEL DISPOSABLE) ×6 IMPLANT
TRAY FOLEY CATH 14FR (SET/KITS/TRAYS/PACK) ×3 IMPLANT
TROCAR OPTI TIP 5M 100M (ENDOMECHANICALS) ×3 IMPLANT
TROCAR XCEL DIL TIP R 11M (ENDOMECHANICALS) ×3 IMPLANT
WARMER LAPAROSCOPE (MISCELLANEOUS) ×3 IMPLANT
WATER STERILE IRR 1000ML POUR (IV SOLUTION) ×3 IMPLANT

## 2014-05-16 NOTE — Anesthesia Preprocedure Evaluation (Signed)
Anesthesia Evaluation  Patient identified by MRN, date of birth, ID band Patient awake    Reviewed: Allergy & Precautions, H&P , Patient's Chart, lab work & pertinent test results, reviewed documented beta blocker date and time   History of Anesthesia Complications Negative for: history of anesthetic complications  Airway Mallampati: II  TM Distance: >3 FB Neck ROM: full    Dental   Pulmonary  breath sounds clear to auscultation        Cardiovascular Exercise Tolerance: Good Rhythm:regular Rate:Normal     Neuro/Psych    GI/Hepatic GERD-  ,  Endo/Other    Renal/GU      Musculoskeletal   Abdominal   Peds  Hematology   Anesthesia Other Findings   Reproductive/Obstetrics                             Anesthesia Physical Anesthesia Plan  ASA: I  Anesthesia Plan: General ETT   Post-op Pain Management:    Induction:   Airway Management Planned:   Additional Equipment:   Intra-op Plan:   Post-operative Plan:   Informed Consent: I have reviewed the patients History and Physical, chart, labs and discussed the procedure including the risks, benefits and alternatives for the proposed anesthesia with the patient or authorized representative who has indicated his/her understanding and acceptance.   Dental Advisory Given  Plan Discussed with: CRNA and Surgeon  Anesthesia Plan Comments:         Anesthesia Quick Evaluation

## 2014-05-16 NOTE — Anesthesia Postprocedure Evaluation (Signed)
  Anesthesia Post-op Note  Patient: Donna Holmes  Procedure(s) Performed: Procedure(s): LAPAROSCOPIC ASSISTED VAGINAL HYSTERECTOMY (N/A) ANTERIOR AND POSTERIOR REPAIR WITH SACROSPINOUS LIGAMENT SUSPENSION (N/A) Patient is awake and responsive. Pain and nausea are reasonably well controlled. Vital signs are stable and clinically acceptable. Oxygen saturation is clinically acceptable. There are no apparent anesthetic complications at this time. Patient is ready for discharge.

## 2014-05-16 NOTE — Brief Op Note (Signed)
05/16/2014  9:54 AM  PATIENT:  Donna Holmes  64 y.o. female  PRE-OPERATIVE DIAGNOSIS:  uterine prolapse, cystocele, rectocele, fibroids  POST-OPERATIVE DIAGNOSIS:  uterine prolapse, cystocele, rectocele, fibroids  PROCEDURE:  Procedure(s): LAPAROSCOPIC ASSISTED VAGINAL HYSTERECTOMY (N/A) ANTERIOR AND POSTERIOR REPAIR WITH SACROSPINOUS LIGAMENT SUSPENSION (N/A)  SURGEON:  Surgeon(s) and Role:    * Luz Lex, MD - Primary    * Linda Hedges, DO - Assisting  PHYSICIAN ASSISTANT:   ASSISTANTS: Morris   ANESTHESIA:   general  EBL:  Total I/O In: 2000 [I.V.:2000] Out: 1900 [Urine:800; Blood:1100]EBL 600cc  BLOOD ADMINISTERED:none  DRAINS: Urinary Catheter (Foley)   LOCAL MEDICATIONS USED:  MARCAINE     SPECIMEN:  Source of Specimen:  uterus and tubes  DISPOSITION OF SPECIMEN:  PATHOLOGY  COUNTS:  YES  TOURNIQUET:  * No tourniquets in log *  DICTATION: .Other Dictation: Dictation Number 1  PLAN OF CARE: Admit to inpatient   PATIENT DISPOSITION:  PACU - hemodynamically stable.   Delay start of Pharmacological VTE agent (>24hrs) due to surgical blood loss or risk of bleeding: not applicable

## 2014-05-16 NOTE — Anesthesia Postprocedure Evaluation (Signed)
Anesthesia Post Note  Patient: Donna Holmes  Procedure(s) Performed: Procedure(s): LAPAROSCOPIC ASSISTED VAGINAL HYSTERECTOMY (N/A) ANTERIOR AND POSTERIOR REPAIR WITH SACROSPINOUS LIGAMENT SUSPENSION (N/A)  Anesthesia type: General  Patient location: [facility]  Post pain: Pain level controlled  Post assessment: Post-op Vital signs reviewed  Last Vitals: BP 114/78 mmHg  Pulse 90  Temp(Src) 36.4 C (Oral)  Resp 20  Ht _0  (1.626 m)  Wt 145 lb (65.772 kg)  BMI 24.88 kg/m2  SpO2 99%  Post vital signs: Reviewed  Level of consciousness: awake  Complications: No apparent anesthesia complications

## 2014-05-16 NOTE — Anesthesia Procedure Notes (Signed)
Procedure Name: Intubation Date/Time: 05/16/2014 7:37 AM Performed by: Elenore Paddy Pre-anesthesia Checklist: Patient identified, Emergency Drugs available, Suction available and Patient being monitored Patient Re-evaluated:Patient Re-evaluated prior to inductionOxygen Delivery Method: Circle system utilized Preoxygenation: Pre-oxygenation with 100% oxygen Intubation Type: IV induction Ventilation: Mask ventilation without difficulty Laryngoscope Size: Mac and 3 Grade View: Grade II Tube type: Oral Tube size: 7.0 mm Number of attempts: 1 Placement Confirmation: ETT inserted through vocal cords under direct vision,  positive ETCO2 and breath sounds checked- equal and bilateral Secured at: 21 cm Tube secured with: Tape Dental Injury: Teeth and Oropharynx as per pre-operative assessment

## 2014-05-16 NOTE — Transfer of Care (Signed)
Immediate Anesthesia Transfer of Care Note  Patient: Donna Holmes  Procedure(s) Performed: Procedure(s): LAPAROSCOPIC ASSISTED VAGINAL HYSTERECTOMY (N/A) ANTERIOR AND POSTERIOR REPAIR WITH SACROSPINOUS LIGAMENT SUSPENSION (N/A)  Patient Location: PACU  Anesthesia Type:General  Level of Consciousness: awake, alert , oriented and patient cooperative  Airway & Oxygen Therapy: Patient Spontanous Breathing and Patient connected to nasal cannula oxygen  Post-op Assessment: Report given to RN, Post -op Vital signs reviewed and stable and Patient moving all extremities  Post vital signs: Reviewed and stable  Last Vitals:  Filed Vitals:   05/16/14 0605  BP: 127/87  Pulse: 87  Temp: 36.4 C  Resp: 20    Complications: No apparent anesthesia complications

## 2014-05-17 ENCOUNTER — Encounter (HOSPITAL_COMMUNITY): Payer: Self-pay | Admitting: Obstetrics and Gynecology

## 2014-05-17 DIAGNOSIS — N814 Uterovaginal prolapse, unspecified: Secondary | ICD-10-CM | POA: Diagnosis not present

## 2014-05-17 LAB — CBC
HCT: 31.5 % — ABNORMAL LOW (ref 36.0–46.0)
Hemoglobin: 10.6 g/dL — ABNORMAL LOW (ref 12.0–15.0)
MCH: 29.9 pg (ref 26.0–34.0)
MCHC: 33.7 g/dL (ref 30.0–36.0)
MCV: 88.7 fL (ref 78.0–100.0)
PLATELETS: 218 10*3/uL (ref 150–400)
RBC: 3.55 MIL/uL — AB (ref 3.87–5.11)
RDW: 13 % (ref 11.5–15.5)
WBC: 19.4 10*3/uL — AB (ref 4.0–10.5)

## 2014-05-17 NOTE — Progress Notes (Addendum)
Vaginal packing removed. Minimal blood noted on packing. Patient tolerated well.

## 2014-05-17 NOTE — Progress Notes (Signed)
1 Day Post-Op Procedure(s) (LRB): LAPAROSCOPIC ASSISTED VAGINAL HYSTERECTOMY (N/A) ANTERIOR AND POSTERIOR REPAIR WITH SACROSPINOUS LIGAMENT SUSPENSION (N/A)  Subjective: Patient reports tolerating PO and + flatus.    Objective: I have reviewed patient's vital signs, intake and output and medications.  General: alert, cooperative, appears stated age and no distress GI: soft, non-tender; bowel sounds normal; no masses,  no organomegaly Vaginal Bleeding: minimal  Assessment: s/p Procedure(s): LAPAROSCOPIC ASSISTED VAGINAL HYSTERECTOMY (N/A) ANTERIOR AND POSTERIOR REPAIR WITH SACROSPINOUS LIGAMENT SUSPENSION (N/A): stable, progressing well and urinary retention  Plan: Advance diet Encourage ambulation Advance to PO medication Only able to void small amount this morning with large residual so foley replaced Nausea much improved after stopping PCA.  Minimal pain today and controlled with prn Tramadol  LOS: 1 day    Charmayne Odell C 05/17/2014, 9:31 AM

## 2014-05-17 NOTE — Op Note (Signed)
NAMEANGELLY, SPEARING               ACCOUNT NO.:  [id number]  MEDICAL RECORD NO.:  [id number]  LOCATION:  9320                          FACILITY:  [facility]  PHYSICIAN:  Monia Sabal. Corinna Capra, M.D.    DATE OF BIRTH:  19-Mar-1950  DATE OF PROCEDURE:  05/16/2014 DATE OF DISCHARGE:                              OPERATIVE REPORT   PREOPERATIVE DIAGNOSES:  Symptomatic uterine prolapse with cystocele, rectocele, and fibroids.  POSTOPERATIVE DIAGNOSES:  Symptomatic uterine prolapse with cystocele, rectocele, and fibroids.  PROCEDURES:  Laparoscopic-assisted vaginal hysterectomy with bilateral salpingectomy, anterior-posterior colporrhaphy and sacrospinous colpopexy.  SURGEON:  Monia Sabal. Corinna Capra, M.D.  ASSIST:  Dr. Linda Hedges.  ANESTHESIA:  General endotracheal.  INDICATIONS:  Ms. Weill is a 64 year old G5, P1 with worsening problems of uterine prolapse.  Every time she stands, clots or bleeds she finds in the cervix, and cystocele, rectocele, she has to replaced it manually.  All the time, she has to place in the uterus and bladder just to urinate.  Denies any urinary incontinence symptoms, but she would like to have this surgically repaired and she presents for this.  Risks and benefits of procedure were discussed at length, which include, but not limited to risk of infection; bleeding; damage to bowel, bladder, ureters, she does want to preserve her ovaries.  I did discuss with her that most people recommend postmenopausally to remove this, but she does want to preserve this.  Discussed the recovery at length including the potential for leaking of the urine, afterwards self catheterization. All of her questions were answered, given informed consent.  FINDINGS:  At time of surgery enlarged uterus consistent with an 8-10 weeks size of fibroid uterus, normal-appearing appendix, liver, gallbladder, then significant cystocele, rectocele with prolapse.  DESCRIPTION OF PROCEDURE:  After adequate  analgesia, the patient was placed in the dorsal lithotomy position.  She was sterilely prepped and draped.  Bladder was sterilely drained.  Graves speculum was placed.  A Hulka tenaculum was placed on the cervix.  A 1-cm infraumbilical skin incision was made.  A Veress needle was inserted.  The abdomen was insufflated with dullness to percussion.  An 11-mm trocar was inserted. The above findings were noted by laparoscope.  A 5-mm trocar was inserted to the left of the midline 2 fingerbreadths above the pubic symphysis under direct visualization.  After careful and systematic evaluation of the abdomen and pelvis, Gyrus cutting forceps was used to dissect across the right and left mesosalpinx, and across the utero- ovarian ligaments bilaterally with the right and left ovaries fallen to the lateral walls with good hemostasis achieved.  The dissection was carried out across the broad ligament to the inferior portions of the round ligament bilaterally with good hemostasis achieved.  Bladder flap was created at the uterovesical junction.  The abdomen was then desufflated.  Legs were repositioned.  A weighted speculum was placed in the vagina.  Posterior colpotomy was performed.  The cervix was then circumscribed with Bovie cautery.  LigaSure instrument was used to ligate across the uterosacral ligaments bilaterally, cardinal ligaments bilaterally, and bladder pillars.  Anterior vaginal mucosa was undermined and reflected anteriorly.  Anterior peritoneum was entered and a Hotel manager  retractor was placed underneath the bladder.  LigaSure instrument was used to ligate across the uterine vasculature up to the inferior portions of the broad ligament.  The uterus was then removed, with the uterus, cervix and fallopian tubes intact.  The uterosacral ligaments were identified, suture ligated with figure-of-eights of 0 Monocryl suture.  Posterior peritoneum was then closed in pursestring fashion.  The  uterosacral ligaments were then plicated in the midline using figure-of-eights of 0 Monocryl suture and the posterior vaginal mucosa was closed with figure-of-eights of 0 Vicryl suture.  The anterior vaginal mucosa was grasped.  It was undermined with Metzenbaum scissors and reflected laterally to approximately 1 cm origin of the urethral meatus.  The cystocele was reduced by plicating the periurethral vesicular tissue in the midline using figure-of-eights of 0 Monocryl suture.  Good support and hemostasis were achieved.  The excess vaginal mucosa was then excised and the anterior vaginal mucosa was then closed with a running, locking suture of 2-0 Vicryl.  Posterior hymenal remnants were grasped with Allis clamps.  A scalpel was used to undermine the posterior vaginal mucosa, it was elevated with Allis clamps and the posterior rectal mucosa was then incised in the midline and reflected laterally with Metzenbaum scissors revealing the large rectocele.  Blunt dissection was carried out to the right sacrospinous ligament.  A Capio device with an Ethibond suture was used to place an anchor suture 2 fingerbreadths lateral to the right sacrospinous ligament with good support noted and no bleeding noted. The pulley stitch was then placed at the right apex of the vaginal mucosa.  The string was placed laterally, the rest was repaired.  The rectocele was reduced by plicating the perirectal fascia with figure-of- eights of 0 Monocryl suture.  The excess posterior vaginal wall mucosa was then excised, began to close the vaginal mucosa with 2-0 Monocryl suture deep way to the vagina and the sacrospinous stitch was then tightened with excellent support of the apex towards the sacrospinous ligament, was tied and cut.  The remaining portion of the vaginal mucosa was then closed with a running locking layers of 2-0 Monocryl suture. Good support of the bladder relatively noted with excellent support  of the apex noted.  Minimal bleeding was noted.  A Foley catheter was then placed with return of clear yellow urine.  The vagina was then packed with Estrace coated packing.  Legs were repositioned.  Abdomen was reinsufflated.  Examination of the cul-de-sac revealed good hemostasis. The right and left ovary appeared to be normal.  Good peristalsis noted of the ureters bilaterally all the way to the line of dissection after careful and systematic evaluation of the abdomen and pelvis with good hemostasis, no obvious injuries noted from the surgery.  The abdomen was then desufflated.  Trocars were removed.  The infraumbilical skin incision was closed with 0 Vicryl interrupted suture in the fascia, 3-0 Vicryl Rapide repeat subcuticular suture, the 5-mm site was closed with 3-0 Vicryl Rapide subcuticular suture.  Incisions were injected with 0.25% Marcaine, total of 10 mL was used.  The patient was transferred to the recovery room in stable condition.  Sponges and instrument counts were normal x3.  Estimated blood loss was 600 mL mostly due to anterior- posterior repair.     Monia Sabal Corinna Capra, M.D.     DCL/MEDQ  D:  05/16/2014  T:  05/17/2014  Job:  [id number]

## 2014-05-18 DIAGNOSIS — N814 Uterovaginal prolapse, unspecified: Secondary | ICD-10-CM | POA: Diagnosis not present

## 2014-05-18 MED ORDER — IBUPROFEN 600 MG PO TABS
600.0000 mg | ORAL_TABLET | Freq: Four times a day (QID) | ORAL | Status: DC | PRN
Start: 2014-05-18 — End: 2015-06-09

## 2014-05-18 MED ORDER — TRAMADOL HCL 50 MG PO TABS: 50.0000 mg | ORAL_TABLET | Freq: Four times a day (QID) | ORAL | Status: DC | PRN

## 2014-05-18 MED ORDER — NITROFURANTOIN MONOHYD MACRO 100 MG PO CAPS: 100.0000 mg | ORAL_CAPSULE | Freq: Every day | ORAL | Status: DC

## 2014-05-18 NOTE — Progress Notes (Signed)
Discharge teaching complete. Pt understood all information and did not have any questions. Pt ambulated out of the hospital and discharged home to family.

## 2014-05-18 NOTE — Progress Notes (Signed)
UR completed.

## 2014-05-18 NOTE — Discharge Summary (Signed)
Physician Discharge Summary  Patient ID: Donna Holmes MRN: [id number] DOB/AGE: 08/14/1950 64 y.o.  Admit date: 05/16/2014 Discharge date: 05/18/2014  Admission Diagnoses:  Discharge Diagnoses:  Active Problems:   S/P laparoscopic assisted vaginal hysterectomy (LAVH)   Discharged Condition: good  Hospital Course: Pt underwent uncomplicated LAVH, Bil Sal and A&P repair with SSLS.  Her post op care was unremarkable with quick return of bowel function.  Unable to void adequately Postop D1 we will reattempt voiding trial post op D2 before discharge.  Pain well managed with tramadol and ibuprofen.  Pt desires d/c home  Consults: None  Significant Diagnostic Studies: labs: post op D 1 hgb 10.6  Treatments: surgery: as above  Discharge Exam: Blood pressure 100/66, pulse 74, temperature 97.7 F (36.5 C), temperature source Oral, resp. rate 18, height _0  (1.626 m), weight 145 lb (65.772 kg), SpO2 100 %. General appearance: alert, cooperative, appears stated age and no distress GI: soft, non-tender; bowel sounds normal; no masses,  no organomegaly Incision/Wound:CD&I  Disposition: Final discharge disposition not confirmed  Discharge Instructions    Call MD for:  difficulty breathing, headache or visual disturbances    Complete by:  As directed      Call MD for:  persistant nausea and vomiting    Complete by:  As directed      Call MD for:  redness, tenderness, or signs of infection (pain, swelling, redness, odor or green/yellow discharge around incision site)    Complete by:  As directed      Call MD for:  severe uncontrolled pain    Complete by:  As directed      Call MD for:  temperature >100.4    Complete by:  As directed      Diet general    Complete by:  As directed      Discharge instructions    Complete by:  As directed   Follow up in the office as scheduled in 1 week     Driving Restrictions    Complete by:  As directed   No driving for 1 week     Increase activity  slowly    Complete by:  As directed      Lifting restrictions    Complete by:  As directed   No lifting anything greater than 10 pounds (if you have to ask, don't lift it)     Sexual Activity Restrictions    Complete by:  As directed   Nothing in the vagina for 6 weeks            Medication List    TAKE these medications        BIOTIN PO  Take 500 mg by mouth daily.     calcium carbonate 600 MG Tabs tablet  Commonly known as:  OS-CAL  Take 600 mg by mouth daily.     cetirizine-pseudoephedrine 5-120 MG per tablet  Commonly known as:  ZYRTEC-D  Take 1 tablet by mouth once as needed for allergies.     DHEA PO  Take 10 mg by mouth daily.     Fish Oil 1000 MG Caps  Take 1,000 mg by mouth daily.     ibuprofen 600 MG tablet  Commonly known as:  ADVIL,MOTRIN  Take 1 tablet (600 mg total) by mouth every 6 (six) hours as needed (mild pain).     ibuprofen 200 MG tablet  Commonly known as:  ADVIL,MOTRIN  Take 400 mg by mouth every 6 (six)  hours as needed for headache.     nitrofurantoin (macrocrystal-monohydrate) 100 MG capsule  Commonly known as:  MACROBID  Take 1 capsule (100 mg total) by mouth at bedtime.     pantoprazole 40 MG tablet  Commonly known as:  PROTONIX  Take 1 tablet (40 mg total) by mouth daily.     pravastatin 20 MG tablet  Commonly known as:  PRAVACHOL  Take 1 tablet (20 mg total) by mouth daily.     traMADol 50 MG tablet  Commonly known as:  ULTRAM  Take 1 tablet (50 mg total) by mouth every 6 (six) hours as needed.     VITAMIN D-3 PO  Take 4,000 Units by mouth daily.         Signed: Khyli Swaim C 05/18/2014, 11:03 AM

## 2014-06-14 ENCOUNTER — Other Ambulatory Visit: Payer: Self-pay | Admitting: Internal Medicine

## 2014-06-26 ENCOUNTER — Encounter: Payer: Self-pay | Admitting: Internal Medicine

## 2014-07-31 DIAGNOSIS — R5383 Other fatigue: Secondary | ICD-10-CM | POA: Diagnosis not present

## 2014-07-31 DIAGNOSIS — E349 Endocrine disorder, unspecified: Secondary | ICD-10-CM | POA: Diagnosis not present

## 2014-07-31 DIAGNOSIS — E559 Vitamin D deficiency, unspecified: Secondary | ICD-10-CM | POA: Diagnosis not present

## 2014-08-07 DIAGNOSIS — R945 Abnormal results of liver function studies: Secondary | ICD-10-CM | POA: Diagnosis not present

## 2014-08-07 DIAGNOSIS — Z760 Encounter for issue of repeat prescription: Secondary | ICD-10-CM | POA: Diagnosis not present

## 2014-08-07 DIAGNOSIS — Z136 Encounter for screening for cardiovascular disorders: Secondary | ICD-10-CM | POA: Diagnosis not present

## 2014-08-07 DIAGNOSIS — E349 Endocrine disorder, unspecified: Secondary | ICD-10-CM | POA: Diagnosis not present

## 2014-08-07 DIAGNOSIS — Z6824 Body mass index (BMI) 24.0-24.9, adult: Secondary | ICD-10-CM | POA: Diagnosis not present

## 2014-08-07 DIAGNOSIS — J301 Allergic rhinitis due to pollen: Secondary | ICD-10-CM | POA: Diagnosis not present

## 2014-08-07 DIAGNOSIS — E781 Pure hyperglyceridemia: Secondary | ICD-10-CM | POA: Diagnosis not present

## 2014-08-07 DIAGNOSIS — R51 Headache: Secondary | ICD-10-CM | POA: Diagnosis not present

## 2014-08-08 DIAGNOSIS — H5203 Hypermetropia, bilateral: Secondary | ICD-10-CM | POA: Diagnosis not present

## 2014-08-08 DIAGNOSIS — H25813 Combined forms of age-related cataract, bilateral: Secondary | ICD-10-CM | POA: Diagnosis not present

## 2014-08-08 DIAGNOSIS — H40003 Preglaucoma, unspecified, bilateral: Secondary | ICD-10-CM | POA: Diagnosis not present

## 2014-08-08 DIAGNOSIS — H04123 Dry eye syndrome of bilateral lacrimal glands: Secondary | ICD-10-CM | POA: Diagnosis not present

## 2014-08-08 DIAGNOSIS — H524 Presbyopia: Secondary | ICD-10-CM | POA: Diagnosis not present

## 2014-08-08 DIAGNOSIS — H52223 Regular astigmatism, bilateral: Secondary | ICD-10-CM | POA: Diagnosis not present

## 2014-12-04 ENCOUNTER — Other Ambulatory Visit: Payer: Self-pay

## 2014-12-04 DIAGNOSIS — Z1231 Encounter for screening mammogram for malignant neoplasm of breast: Secondary | ICD-10-CM

## 2014-12-13 ENCOUNTER — Ambulatory Visit
Admission: RE | Admit: 2014-12-13 | Discharge: 2014-12-13 | Disposition: A | Discharge status: Home / Self Care | Payer: Medicare Other | Source: Ambulatory Visit

## 2014-12-13 DIAGNOSIS — Z1231 Encounter for screening mammogram for malignant neoplasm of breast: Secondary | ICD-10-CM | POA: Diagnosis not present

## 2014-12-17 ENCOUNTER — Other Ambulatory Visit: Payer: Self-pay | Admitting: Obstetrics and Gynecology

## 2014-12-17 DIAGNOSIS — R928 Other abnormal and inconclusive findings on diagnostic imaging of breast: Secondary | ICD-10-CM

## 2014-12-25 ENCOUNTER — Ambulatory Visit
Admission: RE | Admit: 2014-12-25 | Discharge: 2014-12-25 | Disposition: A | Discharge status: Home / Self Care | Payer: Medicare Other | Source: Ambulatory Visit | Attending: Obstetrics and Gynecology | Admitting: Obstetrics and Gynecology

## 2014-12-25 DIAGNOSIS — R928 Other abnormal and inconclusive findings on diagnostic imaging of breast: Secondary | ICD-10-CM

## 2015-04-15 LAB — HM DEXA SCAN: HM DEXA SCAN: NORMAL

## 2015-04-30 ENCOUNTER — Ambulatory Visit: Payer: Self-pay

## 2015-04-30 ENCOUNTER — Encounter: Payer: Self-pay | Admitting: Podiatry

## 2015-04-30 ENCOUNTER — Ambulatory Visit (INDEPENDENT_AMBULATORY_CARE_PROVIDER_SITE_OTHER): Payer: Medicare Other | Admitting: Podiatry

## 2015-04-30 DIAGNOSIS — I739 Peripheral vascular disease, unspecified: Secondary | ICD-10-CM

## 2015-04-30 DIAGNOSIS — M79671 Pain in right foot: Secondary | ICD-10-CM

## 2015-04-30 NOTE — Progress Notes (Signed)
Subjective:     Patient ID: Donna Holmes, female   DOB: [birth year], 65 y.o.   MRN: [id number]  HPI patient presents stating that the end of my third toe right has discolored and I think maybe I exposed to cold and it doesn't really hurt I wanted it checked   Review of Systems  All other systems reviewed and are negative.      Objective:   Physical Exam  Constitutional: She is oriented to person, place, and time.  Cardiovascular: Intact distal pulses.   Musculoskeletal: Normal range of motion.  Neurological: She is oriented to person, place, and time.  Skin: Skin is warm.  Nursing note and vitals reviewed.  neurovascular status found to be intact with muscle strength adequate range of motion within normal limits with patient found to have on the distal end of the third toe right there is discoloration and some bulbous appearance which may be related to trauma or possible ray and orthotics with a vascular incident in exposure to cold. Patient has good digital perfusion and is well oriented     Assessment:     Possible raynaud phenomena versus trauma    Plan:     H&P and x-rays reviewed and advised on warm soaks daily thick socks and no exposure to cold. If any issues were to occur patient   is to let us know   X-ray report indicates well-healed surgical site right first metatarsal with no pathology and no indication to trauma third toe

## 2015-06-09 ENCOUNTER — Other Ambulatory Visit (INDEPENDENT_AMBULATORY_CARE_PROVIDER_SITE_OTHER): Payer: Medicare Other

## 2015-06-09 ENCOUNTER — Encounter: Payer: Self-pay | Admitting: Internal Medicine

## 2015-06-09 ENCOUNTER — Ambulatory Visit (INDEPENDENT_AMBULATORY_CARE_PROVIDER_SITE_OTHER): Payer: Medicare Other | Admitting: Internal Medicine

## 2015-06-09 ENCOUNTER — Other Ambulatory Visit: Payer: Self-pay | Admitting: Internal Medicine

## 2015-06-09 VITALS — BP 110/68 | HR 89 | Temp 98.4°F | Ht 64.0 in | Wt 147.0 lb

## 2015-06-09 DIAGNOSIS — Z1159 Encounter for screening for other viral diseases: Secondary | ICD-10-CM

## 2015-06-09 DIAGNOSIS — R300 Dysuria: Secondary | ICD-10-CM

## 2015-06-09 DIAGNOSIS — E785 Hyperlipidemia, unspecified: Secondary | ICD-10-CM | POA: Diagnosis not present

## 2015-06-09 DIAGNOSIS — D509 Iron deficiency anemia, unspecified: Secondary | ICD-10-CM

## 2015-06-09 DIAGNOSIS — D649 Anemia, unspecified: Secondary | ICD-10-CM

## 2015-06-09 DIAGNOSIS — R55 Syncope and collapse: Secondary | ICD-10-CM | POA: Diagnosis not present

## 2015-06-09 DIAGNOSIS — R937 Abnormal findings on diagnostic imaging of other parts of musculoskeletal system: Secondary | ICD-10-CM | POA: Diagnosis not present

## 2015-06-09 DIAGNOSIS — Z0000 Encounter for general adult medical examination without abnormal findings: Secondary | ICD-10-CM

## 2015-06-09 HISTORY — DX: Iron deficiency anemia, unspecified: D50.9

## 2015-06-09 LAB — URINALYSIS, ROUTINE W REFLEX MICROSCOPIC
BILIRUBIN URINE: NEGATIVE
Hgb urine dipstick: NEGATIVE
Ketones, ur: NEGATIVE
NITRITE: POSITIVE — AB
Total Protein, Urine: NEGATIVE
URINE GLUCOSE: NEGATIVE
UROBILINOGEN UA: 0.2 (ref 0.0–1.0)
pH: 6 (ref 5.0–8.0)

## 2015-06-09 LAB — CBC WITH DIFFERENTIAL/PLATELET
BASOS ABS: 0.1 10*3/uL (ref 0.0–0.1)
Basophils Relative: 0.9 % (ref 0.0–3.0)
EOS PCT: 2.1 % (ref 0.0–5.0)
Eosinophils Absolute: 0.2 10*3/uL (ref 0.0–0.7)
HEMATOCRIT: 31.9 % — AB (ref 36.0–46.0)
HEMOGLOBIN: 10.5 g/dL — AB (ref 12.0–15.0)
LYMPHS PCT: 34 % (ref 12.0–46.0)
Lymphs Abs: 2.9 10*3/uL (ref 0.7–4.0)
MCHC: 32.8 g/dL (ref 30.0–36.0)
MCV: 82.7 fl (ref 78.0–100.0)
MONOS PCT: 9.6 % (ref 3.0–12.0)
Monocytes Absolute: 0.8 10*3/uL (ref 0.1–1.0)
NEUTROS PCT: 53.4 % (ref 43.0–77.0)
Neutro Abs: 4.5 10*3/uL (ref 1.4–7.7)
Platelets: 320 10*3/uL (ref 150.0–400.0)
RBC: 3.86 Mil/uL — AB (ref 3.87–5.11)
RDW: 14.5 % (ref 11.5–15.5)
WBC: 8.5 10*3/uL (ref 4.0–10.5)

## 2015-06-09 LAB — POCT URINALYSIS DIPSTICK
Bilirubin, UA: NEGATIVE
Blood, UA: NEGATIVE
Glucose, UA: NEGATIVE
Ketones, UA: NEGATIVE
NITRITE UA: POSITIVE
PH UA: 6
PROTEIN UA: NEGATIVE
Spec Grav, UA: 1.015
UROBILINOGEN UA: NEGATIVE

## 2015-06-09 LAB — IBC PANEL
Iron: 33 ug/dL — ABNORMAL LOW (ref 42–145)
Saturation Ratios: 6.2 % — ABNORMAL LOW (ref 20.0–50.0)
Transferrin: 381 mg/dL — ABNORMAL HIGH (ref 212.0–360.0)

## 2015-06-09 LAB — BASIC METABOLIC PANEL
BUN: 17 mg/dL (ref 6–23)
CO2: 27 mEq/L (ref 19–32)
Calcium: 10.1 mg/dL (ref 8.4–10.5)
Chloride: 105 mEq/L (ref 96–112)
Creatinine, Ser: 0.94 mg/dL (ref 0.40–1.20)
GFR: 63.09 mL/min (ref >60.00)
GLUCOSE: 94 mg/dL (ref 70–99)
POTASSIUM: 4.9 meq/L (ref 3.5–5.1)
SODIUM: 138 meq/L (ref 135–145)

## 2015-06-09 LAB — HEPATITIS C ANTIBODY: HCV AB: NEGATIVE

## 2015-06-09 LAB — TSH: TSH: 1.36 u[IU]/mL (ref 0.35–4.50)

## 2015-06-09 MED ORDER — CEPHALEXIN 500 MG PO CAPS: 500.0000 mg | ORAL_CAPSULE | Freq: Four times a day (QID) | ORAL | Status: DC

## 2015-06-09 NOTE — Progress Notes (Addendum)
Subjective:    Patient ID: Donna Holmes, female    DOB: 07/06/1950, 65 y.o.   MRN: [id number]  HPI  Here for wellness and f/u;  Overall doing ok;  Pt denies Chest pain, worsening SOB, DOE, wheezing, orthopnea, PND, worsening LE edema, palpitations, dizziness but did have syncope episode assoc with low BP x 2 wks ago  Did have some bleeding post colonoscopy (benign results per pt). .  Pt denies other neurological change such as new headache, facial or extremity weakness.  Pt denies polydipsia, polyuria, or low sugar symptoms. Pt states overall good compliance with treatment and medications, good tolerability, and has been trying to follow appropriate diet.  Pt denies worsening depressive symptoms, suicidal ideation or panic. No fever, night sweats, wt loss, loss of appetite, or other constitutional symptoms.  Pt states good ability with ADL's, has low fall risk, home safety reviewed and adequate, no other significant changes in hearing or vision, and only occasionally active with exercise. Declines for now pneumovax.  Brings peper results of normal DXA recently per GYN.  S/p TAH mar 2016, also bladder surgury due to prlapse, had post op infection tx with antibx, and did well until last few days with malodor , dysuria, but Denies urinary symptoms such as frequency, urgency, flank pain, hematuria or n/v, fever, chills. Past Medical History  Diagnosis Date  . ALLERGIC RHINITIS 05/19/2009  . Headache(784.0) 05/19/2009  . 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 CAROTID ARTERY DISEASE 01/27/2010    pt stated "blood in eye" and had artery checked.Marland Kitchen all normal  . GERD (gastroesophageal reflux disease)    Past Surgical History  Procedure Laterality Date  . Bunionectomy Right   . Neuroma surgery Right     2nd interspace  . Bartholin cyst marsupialization    . Laparoscopic assisted vaginal hysterectomy N/A 05/16/2014    Procedure: LAPAROSCOPIC ASSISTED VAGINAL HYSTERECTOMY;  Surgeon: Luz Lex, MD;  Location: [facility];  Service: Gynecology;  Laterality: N/A;  . Anterior and posterior repair with sacrospinous fixation N/A 05/16/2014    Procedure: ANTERIOR AND POSTERIOR REPAIR WITH SACROSPINOUS LIGAMENT SUSPENSION;  Surgeon: Luz Lex, MD;  Location: [facility];  Service: Gynecology;  Laterality: N/A;    reports that she has never smoked. She has never used smokeless tobacco. She reports that she does not drink alcohol or use illicit drugs. family history includes Alcohol abuse in her father; Hypertension in her mother; Prostate cancer in her maternal uncle. Allergies  Allergen Reactions  . Oxycontin [Oxycodone Hcl] Nausea Only and Other (See Comments)    FELT LIKE I WAS GOING TO FAINT   Current Outpatient Prescriptions on File Prior to Visit  Medication Sig Dispense Refill  . BIOTIN PO Take 500 mg by mouth daily. Reported on 04/30/2015    . calcium carbonate (OS-CAL) 600 MG TABS tablet Take 600 mg by mouth daily.    . cetirizine-pseudoephedrine (ZYRTEC-D) 5-120 MG per tablet Take 1 tablet by mouth once as needed for allergies.    . Cholecalciferol (VITAMIN D-3 PO) Take 4,000 Units by mouth daily.    Marland Kitchen ibuprofen (ADVIL,MOTRIN) 200 MG tablet Take 400 mg by mouth every 6 (six) hours as needed for headache.    . Nutritional Supplements (DHEA PO) Take 10 mg by mouth daily.     . Omega-3 Fatty Acids (FISH OIL) 1000 MG CAPS Take 1,000 mg by mouth daily.    . pantoprazole (PROTONIX) 40 MG tablet Take 1 tablet (40 mg total) by mouth  daily. 90 tablet 3   No current facility-administered medications on file prior to visit.    Review of Systems  Constitutional: Negative for increased diaphoresis, or other activity, appetite or siginficant weight change other than noted HENT: Negative for worsening hearing loss, ear pain, facial swelling, mouth sores and neck stiffness.   Eyes: Negative for other worsening pain, redness or visual disturbance.  Respiratory: Negative for choking or stridor Cardiovascular: Negative  for other chest pain and palpitations.  Gastrointestinal: Negative for worsening diarrhea, blood in stool, or abdominal distention Genitourinary: Negative for hematuria, flank pain or change in urine volume.  Musculoskeletal: Negative for myalgias or other joint complaints.  Skin: Negative for other color change and wound or drainage.  Neurological: Negative for syncope and numbness. other than noted Hematological: Negative for adenopathy. or other swelling Psychiatric/Behavioral: Negative for hallucinations, SI, self-injury, decreased concentration or other worsening agitation.      Objective:   Physical Exam BP 110/68 mmHg  Pulse 89  Temp(Src) 98.4 F (36.9 C) (Oral)  Ht _0  (1.626 m)  Wt 147 lb (66.679 kg)  BMI 25.22 kg/m2  SpO2 97% VS noted, mild ill Constitutional: Pt is oriented to person, place, and time. Appears well-developed and well-nourished, in no significant distress Head: Normocephalic and atraumatic  Eyes: Conjunctivae and EOM are normal. Pupils are equal, round, and reactive to light Right Ear: External ear normal.  Left Ear: External ear normal Nose: Nose normal.  Mouth/Throat: Oropharynx is clear and moist  Neck: Normal range of motion. Neck supple. No JVD present. No tracheal deviation present or significant neck LA or mass Cardiovascular: Normal rate, regular rhythm, normal heart sounds and intact distal pulses.   Pulmonary/Chest: Effort normal and breath sounds without rales or wheezing  Abdominal: Soft. Bowel sounds are normal. NT. No HSM  - non tender low mid abd as well Musculoskeletal: Normal range of motion. Exhibits no edema Lymphadenopathy: Has no cervical adenopathy.  Neurological: Pt is alert and oriented to person, place, and time. Pt has normal reflexes. No cranial nerve deficit. Motor grossly intact Skin: Skin is warm and dry. No rash noted or new ulcers Psychiatric:  Has normal mood and affect. Behavior is normal.  Spine nontender, including  bilat paravertebral areas  POCT urinalysis dipstick  Status: Finalresult Visible to patient:  Not Released Dx:  Dysuria              Ref Range 4:01 PM  [id number]     Color, UA  straw     Clarity, UA  clear     Glucose, UA  negative     Bilirubin, UA  negative     Ketones, UA  negative     Spec Grav, UA  1.015     Blood, UA  negative     pH, UA  6.0     Protein, UA  negative     Urobilinogen, UA  negative 0.2    Nitrite, UA  POSITIVE     Leukocytes, UA Negative  Trace (A) NEGATIVE   Resulting Agency               Assessment & Plan:

## 2015-06-09 NOTE — Assessment & Plan Note (Signed)
Pt declines ecg, echo, cxr, believes fainting assoc with Gi procedure,  to f/u any worsening symptoms or concerns

## 2015-06-09 NOTE — Assessment & Plan Note (Signed)

## 2015-06-09 NOTE — Assessment & Plan Note (Addendum)
Etiology unclear, for f/u cbc, b12, iron levels  In addition to the time spent performing CPE, I spent an additional 40 minutes face to face,in which greater than 50% of this time was spent in counseling and coordination of care for patient's acute illness as documented.

## 2015-06-09 NOTE — Patient Instructions (Addendum)
Please take all new medication as prescribed - the antibiotic  Your specimen will be sent to lab for culture  Please continue all other medications as before, and refills have been done if requested.  Please have the pharmacy call with any other refills you may need.  Please continue your efforts at being more active, low cholesterol diet, and weight control.  You are otherwise up to date with prevention measures today.  Please keep your appointments with your specialists as you may have planned  Please go to the LAB in the Basement (turn left off the elevator) for the tests to be done today  You will be contacted by phone if any changes need to be made immediately.  Otherwise, you will receive a letter about your results with an explanation, but please check with MyChart first.  Please remember to sign up for MyChart if you have not done so, as this will be important to you in the future with finding out test results, communicating by private email, and scheduling acute appointments online when needed.  Please return in 1 year for your yearly visit, or sooner if needed, with Lab testing done 3-5 days before

## 2015-06-09 NOTE — Progress Notes (Signed)
Pre visit review using our clinic review tool, if applicable. No additional management support is needed unless otherwise documented below in the visit note.

## 2015-06-09 NOTE — Assessment & Plan Note (Signed)
C/w prob mild uti, Udip reveiwed, for antibx, urine cx results f/u

## 2015-06-09 NOTE — Assessment & Plan Note (Signed)
With hx of cad, consider statin for ldl > 70, stable overall by history and exam, recent data reviewed with pt, and pt to continue medical treatment as before,  to f/u any worsening symptoms or concerns Lab Results  Component Value Date   CHOL 199 01/29/2014   HDL 36.30* 01/29/2014   LDLDIRECT 128.2 01/29/2014   TRIG 205.0* 01/29/2014   CHOLHDL 5 01/29/2014   For f/u lab today

## 2015-06-09 NOTE — Addendum Note (Signed)
Addended by: Aviva Signs M on: 06/09/2015 04:02 PM   Modules accepted: Orders

## 2015-06-11 LAB — URINE CULTURE: Colony Count: >=100000

## 2015-06-13 ENCOUNTER — Other Ambulatory Visit (INDEPENDENT_AMBULATORY_CARE_PROVIDER_SITE_OTHER): Payer: Medicare Other

## 2015-06-13 ENCOUNTER — Encounter: Payer: Self-pay | Admitting: Internal Medicine

## 2015-06-13 DIAGNOSIS — D509 Iron deficiency anemia, unspecified: Secondary | ICD-10-CM

## 2015-06-13 DIAGNOSIS — D649 Anemia, unspecified: Secondary | ICD-10-CM | POA: Diagnosis not present

## 2015-06-13 DIAGNOSIS — R300 Dysuria: Secondary | ICD-10-CM | POA: Diagnosis not present

## 2015-06-13 DIAGNOSIS — Z0000 Encounter for general adult medical examination without abnormal findings: Secondary | ICD-10-CM

## 2015-06-13 DIAGNOSIS — Z1159 Encounter for screening for other viral diseases: Secondary | ICD-10-CM

## 2015-06-13 DIAGNOSIS — R937 Abnormal findings on diagnostic imaging of other parts of musculoskeletal system: Secondary | ICD-10-CM

## 2015-06-13 LAB — HEPATIC FUNCTION PANEL
ALBUMIN: 4.4 g/dL (ref 3.5–5.2)
ALK PHOS: 51 U/L (ref 39–117)
ALT: 30 U/L (ref 0–35)
AST: 24 U/L (ref 0–37)
Bilirubin, Direct: 0.1 mg/dL (ref 0.0–0.3)
TOTAL PROTEIN: 7.3 g/dL (ref 6.0–8.3)
Total Bilirubin: 0.6 mg/dL (ref 0.2–1.2)

## 2015-06-13 LAB — LIPID PANEL
CHOL/HDL RATIO: 4
Cholesterol: 180 mg/dL (ref 0–200)
HDL: 40.7 mg/dL (ref >39.00)
LDL CALC: 108 mg/dL — AB (ref 0–99)
NONHDL: 139.7
Triglycerides: 161 mg/dL — ABNORMAL HIGH (ref 0.0–149.0)
VLDL: 32.2 mg/dL (ref 0.0–40.0)

## 2015-06-13 LAB — VITAMIN B12: Vitamin B-12: 401 pg/mL (ref 211–911)

## 2015-07-09 ENCOUNTER — Encounter: Payer: Self-pay | Admitting: Internal Medicine

## 2015-07-09 MED ORDER — PANTOPRAZOLE SODIUM 40 MG PO TBEC: 40.0000 mg | DELAYED_RELEASE_TABLET | Freq: Every day | ORAL | Status: DC

## 2015-08-08 ENCOUNTER — Encounter: Payer: Medicare Other | Admitting: Internal Medicine

## 2015-08-15 ENCOUNTER — Other Ambulatory Visit (INDEPENDENT_AMBULATORY_CARE_PROVIDER_SITE_OTHER): Payer: Medicare Other

## 2015-08-15 ENCOUNTER — Encounter: Payer: Self-pay | Admitting: Internal Medicine

## 2015-08-15 ENCOUNTER — Ambulatory Visit (INDEPENDENT_AMBULATORY_CARE_PROVIDER_SITE_OTHER): Payer: Medicare Other | Admitting: Internal Medicine

## 2015-08-15 VITALS — BP 118/78 | HR 81 | Temp 98.0°F | Resp 20 | Wt 146.0 lb

## 2015-08-15 DIAGNOSIS — R300 Dysuria: Secondary | ICD-10-CM

## 2015-08-15 DIAGNOSIS — D509 Iron deficiency anemia, unspecified: Secondary | ICD-10-CM

## 2015-08-15 DIAGNOSIS — E785 Hyperlipidemia, unspecified: Secondary | ICD-10-CM | POA: Diagnosis not present

## 2015-08-15 DIAGNOSIS — Z0189 Encounter for other specified special examinations: Secondary | ICD-10-CM

## 2015-08-15 DIAGNOSIS — Z0000 Encounter for general adult medical examination without abnormal findings: Secondary | ICD-10-CM

## 2015-08-15 LAB — CBC WITH DIFFERENTIAL/PLATELET
BASOS PCT: 1 % (ref 0.0–3.0)
Basophils Absolute: 0.1 10*3/uL (ref 0.0–0.1)
EOS PCT: 2.4 % (ref 0.0–5.0)
Eosinophils Absolute: 0.2 10*3/uL (ref 0.0–0.7)
HCT: 38.2 % (ref 36.0–46.0)
HEMOGLOBIN: 12.6 g/dL (ref 12.0–15.0)
LYMPHS ABS: 3 10*3/uL (ref 0.7–4.0)
Lymphocytes Relative: 35.5 % (ref 12.0–46.0)
MCHC: 33.1 g/dL (ref 30.0–36.0)
MCV: 84.3 fl (ref 78.0–100.0)
MONOS PCT: 7.9 % (ref 3.0–12.0)
Monocytes Absolute: 0.7 10*3/uL (ref 0.1–1.0)
Neutro Abs: 4.5 10*3/uL (ref 1.4–7.7)
Neutrophils Relative %: 53.2 % (ref 43.0–77.0)
Platelets: 284 10*3/uL (ref 150.0–400.0)
RBC: 4.53 Mil/uL (ref 3.87–5.11)
RDW: 17.5 % — AB (ref 11.5–15.5)
WBC: 8.4 10*3/uL (ref 4.0–10.5)

## 2015-08-15 LAB — IBC PANEL
Iron: 81 ug/dL (ref 42–145)
Saturation Ratios: 19.4 % — ABNORMAL LOW (ref 20.0–50.0)
Transferrin: 298 mg/dL (ref 212.0–360.0)

## 2015-08-15 NOTE — Patient Instructions (Signed)
Please continue all other medications as before, and refills have been done if requested.  Please have the pharmacy call with any other refills you may need.  Please continue your efforts at being more active, low cholesterol diet, and weight control.  You are otherwise up to date with prevention measures today.  Please keep your appointments with your specialists as you may have planned  Please go to the LAB in the Basement (turn left off the elevator) for the tests to be done today  You will be contacted by phone if any changes need to be made immediately.  Otherwise, you will receive a letter about your results with an explanation, but please check with MyChart first.  Please remember to sign up for MyChart if you have not done so, as this will be important to you in the future with finding out test results, communicating by private email, and scheduling acute appointments online when needed.  Please return in 6 months, or sooner if needed, with Lab testing done 3-5 days before

## 2015-08-15 NOTE — Progress Notes (Signed)
Pre visit review using our clinic review tool, if applicable. No additional management support is needed unless otherwise documented below in the visit note.

## 2015-08-15 NOTE — Assessment & Plan Note (Signed)
Mild elevated, declines statin, o/w stable overall by history and exam, recent data reviewed with pt, and pt to continue medical treatment as before,  to f/u any worsening symptoms or concerns Lab Results  Component Value Date   LDLCALC 108* 06/13/2015

## 2015-08-15 NOTE — Progress Notes (Signed)
Subjective:    Patient ID: Donna Holmes, female    DOB: Aug 31, 1950, 65 y.o.   MRN: [id number]  HPI  Here to f/u; overall doing ok,  Pt denies chest pain, increasing sob or doe, wheezing, orthopnea, PND, increased LE swelling, palpitations, dizziness or syncope.  Pt denies new neurological symptoms such as new headache, or facial or extremity weakness or numbness.  Pt denies polydipsia, polyuria, or low sugar episode.   Pt denies new neurological symptoms such as new headache, or facial or extremity weakness or numbness.   Pt states overall good compliance with meds, mostly trying to follow appropriate diet, with wt overall stable,  Did have EGD and colonoscopy, Dr Earlean Shawl, per pt had evidence for gastritis, asked to stop advil, and take protonix for several weeks, also hyperplastic polyp only on colonoscopy feb 2017. Rec'd to take oral iron, and for f/u cbc today. Past Medical History  Diagnosis Date  . ALLERGIC RHINITIS 05/19/2009  . Headache(784.0) 05/19/2009  . 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 CAROTID ARTERY DISEASE 01/27/2010    pt stated "blood in eye" and had artery checked.Marland Kitchen all normal  . GERD (gastroesophageal reflux disease)   . Anemia, iron deficiency 06/09/2015   Past Surgical History  Procedure Laterality Date  . Bunionectomy Right   . Neuroma surgery Right     2nd interspace  . Bartholin cyst marsupialization    . Laparoscopic assisted vaginal hysterectomy N/A 05/16/2014    Procedure: LAPAROSCOPIC ASSISTED VAGINAL HYSTERECTOMY;  Surgeon: Luz Lex, MD;  Location: [facility];  Service: Gynecology;  Laterality: N/A;  . Anterior and posterior repair with sacrospinous fixation N/A 05/16/2014    Procedure: ANTERIOR AND POSTERIOR REPAIR WITH SACROSPINOUS LIGAMENT SUSPENSION;  Surgeon: Luz Lex, MD;  Location: [facility];  Service: Gynecology;  Laterality: N/A;    reports that she has never smoked. She has never used smokeless tobacco. She reports that she does not drink alcohol or use illicit  drugs. family history includes Alcohol abuse in her father; Hypertension in her mother; Prostate cancer in her maternal uncle. Allergies  Allergen Reactions  . Oxycontin [Oxycodone Hcl] Nausea Only and Other (See Comments)    FELT LIKE I WAS GOING TO FAINT   Current Outpatient Prescriptions on File Prior to Visit  Medication Sig Dispense Refill  . BIOTIN PO Take 500 mg by mouth daily. Reported on 04/30/2015    . calcium carbonate (OS-CAL) 600 MG TABS tablet Take 600 mg by mouth daily.    . cetirizine-pseudoephedrine (ZYRTEC-D) 5-120 MG per tablet Take 1 tablet by mouth once as needed for allergies.    . Cholecalciferol (VITAMIN D-3 PO) Take 4,000 Units by mouth daily.    Marland Kitchen ibuprofen (ADVIL,MOTRIN) 200 MG tablet Take 400 mg by mouth every 6 (six) hours as needed for headache.    . Nutritional Supplements (DHEA PO) Take 10 mg by mouth daily.     . Omega-3 Fatty Acids (FISH OIL) 1000 MG CAPS Take 1,000 mg by mouth daily.    . pantoprazole (PROTONIX) 40 MG tablet Take 1 tablet (40 mg total) by mouth daily. 90 tablet 3   No current facility-administered medications on file prior to visit.   Review of Systems Constitutional: Negative for increased diaphoresis, or other activity, appetite or siginficant weight change other than noted HENT: Negative for worsening hearing loss, ear pain, facial swelling, mouth sores and neck stiffness.   Eyes: Negative for other worsening pain, redness or visual disturbance.  Respiratory: Negative for  choking or stridor Cardiovascular: Negative for other chest pain and palpitations.  Gastrointestinal: Negative for worsening diarrhea, blood in stool, or abdominal distention Genitourinary: Negative for hematuria, flank pain or change in urine volume.  Musculoskeletal: Negative for myalgias or other joint complaints.  Skin: Negative for other color change and wound or drainage.  Neurological: Negative for syncope and numbness. other than noted Hematological:  Negative for adenopathy. or other swelling Psychiatric/Behavioral: Negative for hallucinations, SI, self-injury, decreased concentration or other worsening agitation.      Objective:   Physical Exam BP 118/78 mmHg  Pulse 81  Temp(Src) 98 F (36.7 C) (Oral)  Resp 20  Wt 146 lb (66.225 kg)  SpO2 95% VS noted,  Constitutional: Pt is oriented to person, place, and time. Appears well-developed and well-nourished, in no significant distress Head: Normocephalic and atraumatic  Eyes: Conjunctivae and EOM are normal. Pupils are equal, round, and reactive to light Right Ear: External ear normal.  Left Ear: External ear normal Nose: Nose normal.  Mouth/Throat: Oropharynx is clear and moist  Neck: Normal range of motion. Neck supple. No JVD present. No tracheal deviation present or significant neck LA or mass Cardiovascular: Normal rate, regular rhythm, normal heart sounds and intact distal pulses.   Pulmonary/Chest: Effort normal and breath sounds without rales or wheezing  Abdominal: Soft. Bowel sounds are normal. NT. No HSM  Musculoskeletal: Normal range of motion. Exhibits no edema Lymphadenopathy: Has no cervical adenopathy.  Neurological: Pt is alert and oriented to person, place, and time. Pt has normal reflexes. No cranial nerve deficit. Motor grossly intact Skin: Skin is warm and dry. No rash noted or new ulcers Psychiatric:  Has normal mood and affect. Behavior is normal.    Assessment & Plan:

## 2015-08-15 NOTE — Assessment & Plan Note (Signed)
With recent UTI treated, exam benign and asympt,  to f/u any worsening symptoms or concerns

## 2015-08-15 NOTE — Assessment & Plan Note (Signed)
stable overall by history and exam, recent data reviewed with pt, and pt to continue medical treatment as before,  to f/u any worsening symptoms or concerns. Lab Results  Component Value Date   WBC 8.5 06/09/2015   HGB 10.5* 06/09/2015   HCT 31.9* 06/09/2015   MCV 82.7 06/09/2015   PLT 320.0 06/09/2015   For f/u lab today

## 2015-12-10 ENCOUNTER — Other Ambulatory Visit: Payer: Self-pay | Admitting: Obstetrics and Gynecology

## 2015-12-10 DIAGNOSIS — Z1231 Encounter for screening mammogram for malignant neoplasm of breast: Secondary | ICD-10-CM

## 2015-12-15 ENCOUNTER — Ambulatory Visit
Admission: RE | Admit: 2015-12-15 | Discharge: 2015-12-15 | Disposition: A | Discharge status: Home / Self Care | Payer: Medicare Other | Source: Ambulatory Visit | Attending: Obstetrics and Gynecology | Admitting: Obstetrics and Gynecology

## 2015-12-15 DIAGNOSIS — Z1231 Encounter for screening mammogram for malignant neoplasm of breast: Secondary | ICD-10-CM

## 2015-12-23 ENCOUNTER — Encounter: Payer: Self-pay | Admitting: Internal Medicine

## 2016-07-06 ENCOUNTER — Other Ambulatory Visit: Payer: Self-pay | Admitting: Internal Medicine

## 2016-07-15 ENCOUNTER — Encounter: Payer: Self-pay | Admitting: Internal Medicine

## 2016-07-15 ENCOUNTER — Ambulatory Visit (INDEPENDENT_AMBULATORY_CARE_PROVIDER_SITE_OTHER): Payer: Medicare Other | Admitting: Internal Medicine

## 2016-07-15 VITALS — BP 122/82 | HR 93 | Ht 64.0 in | Wt 141.0 lb

## 2016-07-15 DIAGNOSIS — Z0001 Encounter for general adult medical examination with abnormal findings: Secondary | ICD-10-CM

## 2016-07-15 DIAGNOSIS — Z0000 Encounter for general adult medical examination without abnormal findings: Secondary | ICD-10-CM

## 2016-07-15 NOTE — Progress Notes (Signed)
Subjective:    Patient ID: Donna Holmes, female    DOB: 1951/01/14, 66 y.o.   MRN: [id number]  HPI  Here for wellness and f/u;  Overall doing ok;  Pt denies Chest pain, worsening SOB, DOE, wheezing, orthopnea, PND, worsening LE edema, palpitations, dizziness or syncope.  Pt denies neurological change such as new headache, facial or extremity weakness.  Pt denies polydipsia, polyuria, or low sugar symptoms. Pt states overall good compliance with treatment and medications, good tolerability, and has been trying to follow appropriate diet.  Pt denies worsening depressive symptoms, suicidal ideation or panic. No fever, night sweats, wt loss, loss of appetite, or other constitutional symptoms.  Pt states good ability with ADL's, has low fall risk, home safety reviewed and adequate, no other significant changes in hearing or vision, and only occasionally active with exercise.  Has right index finger pain after slip and fall 1 mo ago, now better. Aldso with bilat knee anterior pain, only to squat left > right, but no pain with walking or sitting pain.  No giveaways.  occas going upstairs can agrevate. Also Does have several wks ongoing nasal allergy symptoms with clearish congestion, itch and sneezing, without fever, pain, ST, cough, swelling or wheezing.  Tends to have left sided nasal and ear symptoms, and left mastoid pain occas, but no fever.  Past Medical History:  Diagnosis Date  . ALLERGIC RHINITIS 05/19/2009  . Anemia, iron deficiency 06/09/2015  . CAROTID ARTERY DISEASE 01/27/2010   pt stated "blood in eye" and had artery checked.Marland Kitchen all normal  . 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 GERD (gastroesophageal reflux disease)   . Headache(784.0) 05/19/2009   Past Surgical History:  Procedure Laterality Date  . ANTERIOR AND POSTERIOR REPAIR WITH SACROSPINOUS FIXATION N/A 05/16/2014   Procedure: ANTERIOR AND POSTERIOR REPAIR WITH SACROSPINOUS LIGAMENT SUSPENSION;  Surgeon: Luz Lex, MD;  Location: [facility];   Service: Gynecology;  Laterality: N/A;  . BARTHOLIN CYST MARSUPIALIZATION    . BUNIONECTOMY Right   . LAPAROSCOPIC ASSISTED VAGINAL HYSTERECTOMY N/A 05/16/2014   Procedure: LAPAROSCOPIC ASSISTED VAGINAL HYSTERECTOMY;  Surgeon: Luz Lex, MD;  Location: [facility];  Service: Gynecology;  Laterality: N/A;  . NEUROMA SURGERY Right    2nd interspace    reports that she has never smoked. She has never used smokeless tobacco. She reports that she does not drink alcohol or use drugs. family history includes Alcohol abuse in her father; Hypertension in her mother; Prostate cancer in her maternal uncle. Allergies  Allergen Reactions  . Oxycontin [Oxycodone Hcl] Nausea Only and Other (See Comments)    FELT LIKE I WAS GOING TO FAINT   Current Outpatient Prescriptions on File Prior to Visit  Medication Sig Dispense Refill  . BIOTIN PO Take 500 mg by mouth daily. Reported on 04/30/2015    . calcium carbonate (OS-CAL) 600 MG TABS tablet Take 600 mg by mouth daily.    . cetirizine-pseudoephedrine (ZYRTEC-D) 5-120 MG per tablet Take 1 tablet by mouth once as needed for allergies.    . Cholecalciferol (VITAMIN D-3 PO) Take 4,000 Units by mouth daily.    . Nutritional Supplements (DHEA PO) Take 10 mg by mouth daily.     . Omega-3 Fatty Acids (FISH OIL) 1000 MG CAPS Take 1,000 mg by mouth daily.    . pantoprazole (PROTONIX) 40 MG tablet Take 1 tablet (40 mg total) by mouth daily. PT IS OVERDUE FOR 65M F/U. APPT NEEDED FOR ADDITIONAL REFILLS. 90 tablet 0  No current facility-administered medications on file prior to visit.    Review of Systems Constitutional: Negative for other unusual diaphoresis, sweats, appetite or weight changes HENT: Negative for other worsening hearing loss, ear pain, facial swelling, mouth sores or neck stiffness.   Eyes: Negative for other worsening pain, redness or other visual disturbance.  Respiratory: Negative for other stridor or swelling Cardiovascular: Negative for other  palpitations or other chest pain  Gastrointestinal: Negative for worsening diarrhea or loose stools, blood in stool, distention or other pain Genitourinary: Negative for hematuria, flank pain or other change in urine volume.  Musculoskeletal: Negative for myalgias or other joint swelling.  Skin: Negative for other color change, or other wound or worsening drainage.  Neurological: Negative for other syncope or numbness. Hematological: Negative for other adenopathy or swelling Psychiatric/Behavioral: Negative for hallucinations, other worsening agitation, SI, self-injury, or new decreased concentration All other system neg per pt    Objective:   Physical Exam BP 122/82   Pulse 93   Ht _0  (1.626 m)   Wt 141 lb (64 kg)   SpO2 99%   BMI 24.20 kg/m  VS noted, mild ill appearing Constitutional: Pt is oriented to person, place, and time. Appears well-developed and well-nourished, in no significant distress and comfortable Head: Normocephalic and atraumatic  Eyes: Conjunctivae and EOM are normal. Pupils are equal, round, and reactive to light Right Ear: External ear normal without discharge Left Ear: External ear normal without discharge Nose: Nose without discharge or deformity Mouth/Throat: Oropharynx is without other ulcerations and moist  Neck: Normal range of motion. Neck supple. No JVD present. No tracheal deviation present or significant neck LA or mass Cardiovascular: Normal rate, regular rhythm, normal heart sounds and intact distal pulses.   Pulmonary/Chest: WOB normal and breath sounds without rales or wheezing  Abdominal: Soft. Bowel sounds are normal. NT. No HSM  Musculoskeletal: Normal range of motion. Exhibits no edema Lymphadenopathy: Has no other cervical adenopathy.  Neurological: Pt is alert and oriented to person, place, and time. Pt has normal reflexes. No cranial nerve deficit. Motor grossly intact, Gait intact Skin: Skin is warm and dry. No rash noted or new  ulcerations Psychiatric:  Has normal mood and affect. Behavior is normal without agitation No other exam findings    Assessment & Plan:

## 2016-07-15 NOTE — Patient Instructions (Signed)
Please continue all other medications as before, and refills have been done if requested.  Please have the pharmacy call with any other refills you may need.  Please continue your efforts at being more active, low cholesterol diet, and weight control.  You are otherwise up to date with prevention measures today.  Please keep your appointments with your specialists as you may have planned  Please go to the LAB in the Basement (turn left off the elevator) for the tests to be done at your convenience  You will be contacted by phone if any changes need to be made immediately.  Otherwise, you will receive a letter about your results with an explanation, but please check with MyChart first.  Please remember to sign up for MyChart if you have not done so, as this will be important to you in the future with finding out test results, communicating by private email, and scheduling acute appointments online when needed.  Please return in 1 year for your yearly visit, or sooner if needed, with Lab testing done 3-5 days before

## 2016-07-15 NOTE — Progress Notes (Signed)
Pre visit review using our clinic review tool, if applicable. No additional management support is needed unless otherwise documented below in the visit note.

## 2016-07-17 DIAGNOSIS — J019 Acute sinusitis, unspecified: Secondary | ICD-10-CM | POA: Insufficient documentation

## 2016-07-17 NOTE — Assessment & Plan Note (Addendum)

## 2016-07-17 NOTE — Assessment & Plan Note (Signed)
Mild to mod, for antibx course,  to f/u any worsening symptoms or concerns  In addition to the time spent performing CPE, I spent an additional 15 minutes face to face,in which greater than 50% of this time was spent in counseling and coordination of care for patient's illness as documented, including differential diagnosis, evaluation and management of acute sinus infections

## 2016-07-19 ENCOUNTER — Other Ambulatory Visit (INDEPENDENT_AMBULATORY_CARE_PROVIDER_SITE_OTHER): Payer: Medicare Other

## 2016-07-19 DIAGNOSIS — R7989 Other specified abnormal findings of blood chemistry: Secondary | ICD-10-CM | POA: Diagnosis not present

## 2016-07-19 DIAGNOSIS — Z0001 Encounter for general adult medical examination with abnormal findings: Secondary | ICD-10-CM

## 2016-07-19 LAB — CBC WITH DIFFERENTIAL/PLATELET
Basophils Absolute: 0.1 10*3/uL (ref 0.0–0.1)
Basophils Relative: 1.2 % (ref 0.0–3.0)
EOS PCT: 1.9 % (ref 0.0–5.0)
Eosinophils Absolute: 0.2 10*3/uL (ref 0.0–0.7)
HEMATOCRIT: 39.4 % (ref 36.0–46.0)
Hemoglobin: 13.3 g/dL (ref 12.0–15.0)
LYMPHS ABS: 3.5 10*3/uL (ref 0.7–4.0)
LYMPHS PCT: 41.1 % (ref 12.0–46.0)
MCHC: 33.6 g/dL (ref 30.0–36.0)
MCV: 88.1 fl (ref 78.0–100.0)
MONOS PCT: 8.2 % (ref 3.0–12.0)
Monocytes Absolute: 0.7 10*3/uL (ref 0.1–1.0)
NEUTROS ABS: 4 10*3/uL (ref 1.4–7.7)
NEUTROS PCT: 47.6 % (ref 43.0–77.0)
PLATELETS: 297 10*3/uL (ref 150.0–400.0)
RBC: 4.48 Mil/uL (ref 3.87–5.11)
RDW: 13.5 % (ref 11.5–15.5)
WBC: 8.5 10*3/uL (ref 4.0–10.5)

## 2016-07-19 LAB — URINALYSIS, ROUTINE W REFLEX MICROSCOPIC
Bilirubin Urine: NEGATIVE
HGB URINE DIPSTICK: NEGATIVE
Ketones, ur: NEGATIVE
Leukocytes, UA: NEGATIVE
Nitrite: NEGATIVE
RBC / HPF: NONE SEEN (ref 0–?)
Total Protein, Urine: NEGATIVE
Urine Glucose: NEGATIVE
Urobilinogen, UA: 0.2 (ref 0.0–1.0)
pH: 6.5 (ref 5.0–8.0)

## 2016-07-19 LAB — LIPID PANEL
Cholesterol: 216 mg/dL — ABNORMAL HIGH (ref 0–200)
HDL: 39.5 mg/dL (ref >39.00)
NONHDL: 176.09
Total CHOL/HDL Ratio: 5
Triglycerides: 241 mg/dL — ABNORMAL HIGH (ref 0.0–149.0)
VLDL: 48.2 mg/dL — ABNORMAL HIGH (ref 0.0–40.0)

## 2016-07-19 LAB — BASIC METABOLIC PANEL
BUN: 14 mg/dL (ref 6–23)
CHLORIDE: 100 meq/L (ref 96–112)
CO2: 29 meq/L (ref 19–32)
Calcium: 9.8 mg/dL (ref 8.4–10.5)
Creatinine, Ser: 0.79 mg/dL (ref 0.40–1.20)
GFR: 76.85 mL/min (ref >60.00)
GLUCOSE: 89 mg/dL (ref 70–99)
POTASSIUM: 3.9 meq/L (ref 3.5–5.1)
SODIUM: 136 meq/L (ref 135–145)

## 2016-07-19 LAB — HEPATIC FUNCTION PANEL
ALBUMIN: 4.6 g/dL (ref 3.5–5.2)
ALK PHOS: 55 U/L (ref 39–117)
ALT: 75 U/L — ABNORMAL HIGH (ref 0–35)
AST: 46 U/L — AB (ref 0–37)
Bilirubin, Direct: 0.1 mg/dL (ref 0.0–0.3)
TOTAL PROTEIN: 7.5 g/dL (ref 6.0–8.3)
Total Bilirubin: 0.9 mg/dL (ref 0.2–1.2)

## 2016-07-19 LAB — TSH: TSH: 1.62 u[IU]/mL (ref 0.35–4.50)

## 2016-07-19 LAB — LDL CHOLESTEROL, DIRECT: LDL DIRECT: 147 mg/dL

## 2016-07-20 ENCOUNTER — Other Ambulatory Visit: Payer: Self-pay | Admitting: Internal Medicine

## 2016-07-20 ENCOUNTER — Telehealth: Payer: Self-pay

## 2016-07-20 MED ORDER — ATORVASTATIN CALCIUM 20 MG PO TABS: 20.0000 mg | ORAL_TABLET | Freq: Every day | ORAL | 3 refills | Status: DC

## 2016-07-20 NOTE — Telephone Encounter (Signed)
Called pt, LVM.   

## 2016-07-20 NOTE — Telephone Encounter (Signed)
-----  Message from Biagio Borg, MD sent at 07/20/2016 12:58 PM EDT ----- Left message on MyChart, pt to cont same tx except  The test results show that your current treatment is OK, except the LDL cholesterol is moderately elevated.  Please follow a lower cholesterol diet, and start lipitor 20 mg per day to reduce your risk of heart disease and stroke.  A new prescription will be sent, and you should hear from the office as well.  Shirron to please inform pt, I will do rx

## 2016-07-21 ENCOUNTER — Telehealth: Payer: Self-pay | Admitting: Internal Medicine

## 2016-07-21 NOTE — Telephone Encounter (Signed)
Pt called back. Please call back.

## 2016-07-21 NOTE — Telephone Encounter (Signed)
Returned the patients call, she was given results ans expressed understanding. However, right now she would like to focus on doing a low cholesterol diet and exercise before starting lipitor.

## 2016-10-05 ENCOUNTER — Other Ambulatory Visit: Payer: Self-pay | Admitting: Internal Medicine

## 2016-11-30 ENCOUNTER — Other Ambulatory Visit: Payer: Self-pay | Admitting: Obstetrics and Gynecology

## 2016-11-30 DIAGNOSIS — Z1231 Encounter for screening mammogram for malignant neoplasm of breast: Secondary | ICD-10-CM

## 2016-12-21 ENCOUNTER — Ambulatory Visit
Admission: RE | Admit: 2016-12-21 | Discharge: 2016-12-21 | Disposition: A | Discharge status: Home / Self Care | Payer: Medicare Other | Source: Ambulatory Visit | Attending: Obstetrics and Gynecology | Admitting: Obstetrics and Gynecology

## 2016-12-21 DIAGNOSIS — Z1231 Encounter for screening mammogram for malignant neoplasm of breast: Secondary | ICD-10-CM

## 2017-02-15 NOTE — Telephone Encounter (Signed)
error

## 2017-03-11 ENCOUNTER — Encounter: Payer: Self-pay | Admitting: Internal Medicine

## 2017-03-23 ENCOUNTER — Encounter: Payer: Self-pay | Admitting: Internal Medicine

## 2017-03-24 NOTE — Telephone Encounter (Signed)
shirron to help with pt to be scheduled as above, thanks

## 2017-04-29 ENCOUNTER — Encounter: Payer: Self-pay | Admitting: Internal Medicine

## 2017-05-06 ENCOUNTER — Ambulatory Visit (INDEPENDENT_AMBULATORY_CARE_PROVIDER_SITE_OTHER): Payer: Medicare Other | Admitting: *Deleted

## 2017-05-06 VITALS — BP 116/58 | HR 98 | Resp 18 | Ht 65.0 in | Wt 135.0 lb

## 2017-05-06 DIAGNOSIS — Z23 Encounter for immunization: Secondary | ICD-10-CM | POA: Diagnosis not present

## 2017-05-06 DIAGNOSIS — Z0000 Encounter for general adult medical examination without abnormal findings: Secondary | ICD-10-CM

## 2017-05-06 NOTE — Progress Notes (Addendum)
Subjective:   Donna Holmes is a 67 y.o. female who presents for an Initial Medicare Annual Wellness Visit.  Review of Systems    No ROS.  Medicare Wellness Visit. Additional risk factors are reflected in the social history.   Cardiac Risk Factors include: advanced age (>60mn, >>41women);dyslipidemia;hypertension Sleep patterns: feels rested on waking, gets up 1 times nightly to void and sleeps 7-8 hours nightly.    Home Safety/Smoke Alarms: Feels safe in home. Smoke alarms in place.  Living environment; residence and Firearm Safety: [place] no firearms Lives with husband, no needs for DME, good support system. Seat Belt Safety/Bike Helmet: Wears seat belt.     Objective:    Today's Vitals   05/06/17 1119  BP: (!) 116/58  Pulse: 98  Resp: 18  Weight: 135 lb (61.2 kg)  Height: _0  (1.651 m)   Body mass index is 22.47 kg/m.  Advanced Directives 05/06/2017 05/16/2014 05/01/2014  Does Patient Have a Medical Advance Directive? No No No  Would patient like information on creating a medical advance directive? Yes (ED - Information included in AVS) No - patient declined information No - patient declined information    Current Medications (verified) Outpatient Encounter Medications as of 05/06/2017  Medication Sig  . acetaminophen (TYLENOL) 500 MG tablet Take 500 mg by mouth every 6 (six) hours as needed.  .Marland KitchenBIOTIN PO Take 500 mg by mouth daily. Reported on 04/30/2015  . calcium carbonate (OS-CAL) 600 MG TABS tablet Take 600 mg by mouth daily.  . cetirizine-pseudoephedrine (ZYRTEC-D) 5-120 MG per tablet Take 1 tablet by mouth once as needed for allergies.  . Cholecalciferol (VITAMIN D-3 PO) Take 4,000 Units by mouth daily.  . Nutritional Supplements (DHEA PO) Take 10 mg by mouth 3 (three) times a week.   . Omega-3 Fatty Acids (FISH OIL) 1000 MG CAPS Take 1,000 mg by mouth daily.  . pantoprazole (PROTONIX) 40 MG tablet TAKE 1 TABLET BY MOUTH EVERY DAY  . [DISCONTINUED]  atorvastatin (LIPITOR) 20 MG tablet Take 1 tablet (20 mg total) by mouth daily. (Patient not taking: Reported on 05/06/2017)   No facility-administered encounter medications on file as of 05/06/2017.     Allergies (verified) Oxycontin [oxycodone hcl]   History: Past Medical History:  Diagnosis Date  . ALLERGIC RHINITIS 05/19/2009  . Anemia, iron deficiency 06/09/2015  . 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 GERD (gastroesophageal reflux disease)   . Headache(784.0) 05/19/2009  . Occlusion and stenosis of carotid artery without mention of cerebral infarction    Past Surgical History:  Procedure Laterality Date  . ANTERIOR AND POSTERIOR REPAIR WITH SACROSPINOUS FIXATION N/A 05/16/2014   Procedure: ANTERIOR AND POSTERIOR REPAIR WITH SACROSPINOUS LIGAMENT SUSPENSION;  Surgeon: DLuz Lex MD;  [facility];  Service: Gynecology;  Laterality: N/A;  . BARTHOLIN CYST MARSUPIALIZATION    . BUNIONECTOMY Right   . LAPAROSCOPIC ASSISTED VAGINAL HYSTERECTOMY N/A 05/16/2014   Procedure: LAPAROSCOPIC ASSISTED VAGINAL HYSTERECTOMY;  Surgeon: DLuz Lex MD;  [facility];  Service: Gynecology;  Laterality: N/A;  . NEUROMA SURGERY Right    2nd interspace   Family History  Problem Relation Age of Onset  . Alcohol abuse Father   . Hypertension Mother   . Prostate cancer Maternal Uncle    Social History   Socioeconomic History  . Marital status: Married    Spouse name: None  . Number of children: None  . Years of education: None  . Highest education level: None  Social Needs  . Financial resource strain: Not hard at all  . Food insecurity - worry: Never true  . Food insecurity - inability: Never true  . Transportation needs - medical: No  . Transportation needs - non-medical: No  Occupational History  . Occupation: nurse  Tobacco Use  . Smoking status: Never Smoker  . Smokeless tobacco: Never Used  Substance and Sexual Activity  . Alcohol use: No  . Drug use: No  . Sexual activity: Not  Currently  Other Topics Concern  . None  Social History Narrative  . None    Tobacco Counseling Counseling given: Not Answered   Activities of Daily Living In your present state of health, do you have any difficulty performing the following activities: 05/06/2017  Hearing? N  Vision? N  Difficulty concentrating or making decisions? N  Walking or climbing stairs? N  Dressing or bathing? N  Doing errands, shopping? N  Preparing Food and eating ? N  Using the Toilet? N  In the past six months, have you accidently leaked urine? N  Do you have problems with loss of bowel control? N  Managing your Medications? N  Managing your Finances? N  Housekeeping or managing your Housekeeping? N  Some recent data might be hidden     Immunizations and Health Maintenance Immunization History  Administered Date(s) Administered  . Influenza-Unspecified 01/14/2015, 12/02/2015  . Pneumococcal Conjugate-13 01/29/2014  . Pneumococcal Polysaccharide-23 05/06/2017  . Td 05/19/2009   There are no preventive care reminders to display for this patient.  Patient Care Team: Biagio Borg, MD as PCP - General (Internal Medicine)  Indicate any recent Medical Services you may have received from other than [facility] providers in the past year (date may be approximate).     Assessment:   This is a routine wellness examination for Donna Holmes. Physical assessment deferred to PCP.   Hearing/Vision screen Hearing Screening Comments: Able to hear conversational tones w/o difficulty. No issues reported.  Passed whisper test Vision Screening Comments: appointment yearly, Dr. Sharren Bridge   Dietary issues and exercise activities discussed: Current Exercise Habits: Structured exercise class, Type of exercise: treadmill;walking;calisthenics;strength training/weights, Time (Minutes): 55, Frequency (Times/Week): 4, Weekly Exercise (Minutes/Week): 220  Diet (meal preparation, eat out, water intake, caffeinated beverages,  dairy products, fruits and vegetables): in general, a "healthy" diet  , well balanced, vegetarian,eats a variety of fruits and vegetables daily, limits salt, fat/cholesterol, sugar,carbohydrates,caffeine, drinks 6-8 glasses of water daily.  Goals    . 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 No Yes  Number falls in past yr: - 1   Cognitive Function:       Ad8 score reviewed for issues:  Issues making decisions: no  Less interest in hobbies / activities: no  Repeats questions, stories (family complaining): no  Trouble using ordinary gadgets (microwave, computer, phone):no  Forgets the month or year: no  Mismanaging finances: no  Remembering appts: no  Daily problems with thinking and/or memory: no Ad8 score is= 0  Screening Tests Health Maintenance  Topic Date Due  . MAMMOGRAM  12/22/2018  . TETANUS/TDAP  05/20/2019  .  COLONOSCOPY  05/11/2025  . INFLUENZA VACCINE  Completed  . DEXA SCAN  Completed  . Hepatitis C Screening  Completed  . PNA vac Low Risk Adult  Completed     Plan:    Continue doing brain stimulating activities (puzzles, reading, adult coloring books, staying active) to keep memory sharp.   Continue to eat heart healthy diet (full of fruits, vegetables, whole grains, lean protein, water--limit salt, fat, and sugar intake) and increase physical activity as tolerated.  I have personally reviewed and noted the following in the patient's chart:   . Medical and social history . Use of alcohol, tobacco or illicit drugs  . Current medications and supplements . Functional ability and status . Nutritional status . Physical activity . Advanced directives . List of  other physicians . Vitals . Screenings to include cognitive, depression, and falls . Referrals and appointments  In addition, I have reviewed and discussed with patient certain preventive protocols, quality metrics, and best practice recommendations. A written personalized care plan for preventive services as well as general preventive health recommendations were provided to patient.     Michiel Cowboy, RN   05/06/2017    Medical screening examination/treatment/procedure(s) were performed by non-physician practitioner and as supervising physician I was immediately available for consultation/collaboration. I agree with above. Cathlean Cower, MD

## 2017-05-06 NOTE — Patient Instructions (Addendum)
Continue doing brain stimulating activities (puzzles, reading, adult coloring books, staying active) to keep memory sharp.   Continue to eat heart healthy diet (full of fruits, vegetables, whole grains, lean protein, water--limit salt, fat, and sugar intake) and increase physical activity as tolerated.   Donna Holmes , Thank you for taking time to come for your Medicare Wellness Visit. I appreciate your ongoing commitment to your health goals. Please review the following plan we discussed and let me know if I can assist you in the future.   These are the goals we discussed: Goals    . 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 Pneumonia vaccines (2 of 2 - PPSV23) 01/30/2015  . Mammogram  12/22/2018  . Tetanus Vaccine  05/20/2019  . Colon Cancer Screening  05/11/2025  . Flu Shot  Completed  . DEXA scan (bone density measurement)  Completed  .  Hepatitis C: One time screening is recommended by Center for Disease Control  (CDC) for  adults born from [birth year] through 1967.   Completed

## 2017-05-31 NOTE — Telephone Encounter (Signed)
Patient has completed AWV in Feb 2019, and has also scheduled wellness appointment for next year (2020). SF

## 2017-07-13 ENCOUNTER — Encounter: Payer: Self-pay | Admitting: Internal Medicine

## 2017-07-21 ENCOUNTER — Encounter: Payer: Medicare Other | Admitting: Internal Medicine

## 2017-07-23 ENCOUNTER — Other Ambulatory Visit: Payer: Self-pay | Admitting: Internal Medicine

## 2017-07-26 ENCOUNTER — Encounter: Payer: Self-pay | Admitting: Internal Medicine

## 2017-07-26 ENCOUNTER — Other Ambulatory Visit (INDEPENDENT_AMBULATORY_CARE_PROVIDER_SITE_OTHER): Payer: Medicare Other

## 2017-07-26 ENCOUNTER — Ambulatory Visit (INDEPENDENT_AMBULATORY_CARE_PROVIDER_SITE_OTHER): Payer: Medicare Other | Admitting: Internal Medicine

## 2017-07-26 VITALS — BP 130/80 | HR 76 | Temp 98.4°F | Ht 65.0 in | Wt 133.8 lb

## 2017-07-26 DIAGNOSIS — Z0000 Encounter for general adult medical examination without abnormal findings: Secondary | ICD-10-CM

## 2017-07-26 LAB — URINALYSIS, ROUTINE W REFLEX MICROSCOPIC
Bilirubin Urine: NEGATIVE
Hgb urine dipstick: NEGATIVE
Ketones, ur: NEGATIVE
Leukocytes, UA: NEGATIVE
Nitrite: NEGATIVE
PH: 6 (ref 5.0–8.0)
RBC / HPF: NONE SEEN (ref 0–?)
Total Protein, Urine: NEGATIVE
UROBILINOGEN UA: 0.2 (ref 0.0–1.0)
Urine Glucose: NEGATIVE

## 2017-07-26 LAB — CBC WITH DIFFERENTIAL/PLATELET
BASOS PCT: 1.4 % (ref 0.0–3.0)
Basophils Absolute: 0.1 10*3/uL (ref 0.0–0.1)
EOS PCT: 3.5 % (ref 0.0–5.0)
Eosinophils Absolute: 0.3 10*3/uL (ref 0.0–0.7)
HEMATOCRIT: 38.3 % (ref 36.0–46.0)
Hemoglobin: 12.9 g/dL (ref 12.0–15.0)
LYMPHS PCT: 35.1 % (ref 12.0–46.0)
Lymphs Abs: 3 10*3/uL (ref 0.7–4.0)
MCHC: 33.6 g/dL (ref 30.0–36.0)
MCV: 87.9 fl (ref 78.0–100.0)
Monocytes Absolute: 0.6 10*3/uL (ref 0.1–1.0)
Monocytes Relative: 6.5 % (ref 3.0–12.0)
Neutro Abs: 4.6 10*3/uL (ref 1.4–7.7)
Neutrophils Relative %: 53.5 % (ref 43.0–77.0)
Platelets: 315 10*3/uL (ref 150.0–400.0)
RBC: 4.36 Mil/uL (ref 3.87–5.11)
RDW: 13.7 % (ref 11.5–15.5)
WBC: 8.6 10*3/uL (ref 4.0–10.5)

## 2017-07-26 LAB — LIPID PANEL
CHOL/HDL RATIO: 5
Cholesterol: 215 mg/dL — ABNORMAL HIGH (ref 0–200)
HDL: 42.6 mg/dL (ref >39.00)
LDL CALC: 135 mg/dL — AB (ref 0–99)
NonHDL: 172.57
TRIGLYCERIDES: 187 mg/dL — AB (ref 0.0–149.0)
VLDL: 37.4 mg/dL (ref 0.0–40.0)

## 2017-07-26 LAB — HEPATIC FUNCTION PANEL
ALT: 28 U/L (ref 0–35)
AST: 22 U/L (ref 0–37)
Albumin: 4.6 g/dL (ref 3.5–5.2)
Alkaline Phosphatase: 57 U/L (ref 39–117)
Bilirubin, Direct: 0.1 mg/dL (ref 0.0–0.3)
TOTAL PROTEIN: 8 g/dL (ref 6.0–8.3)
Total Bilirubin: 0.5 mg/dL (ref 0.2–1.2)

## 2017-07-26 LAB — BASIC METABOLIC PANEL
BUN: 14 mg/dL (ref 6–23)
CO2: 30 mEq/L (ref 19–32)
CREATININE: 0.78 mg/dL (ref 0.40–1.20)
Calcium: 9.9 mg/dL (ref 8.4–10.5)
Chloride: 103 mEq/L (ref 96–112)
GFR: 77.76 mL/min (ref >60.00)
Glucose, Bld: 129 mg/dL — ABNORMAL HIGH (ref 70–99)
POTASSIUM: 3.8 meq/L (ref 3.5–5.1)
SODIUM: 139 meq/L (ref 135–145)

## 2017-07-26 LAB — TSH: TSH: 1.52 u[IU]/mL (ref 0.35–4.50)

## 2017-07-26 NOTE — Progress Notes (Signed)
Subjective:    Patient ID: Donna Holmes, female    DOB: 1950/11/07, 67 y.o.   MRN: [id number]  HPI  Here for wellness and f/u;  Overall doing ok;  Pt denies Chest pain, worsening SOB, DOE, wheezing, orthopnea, PND, worsening LE edema, palpitations, dizziness or syncope.  Pt denies neurological change such as new headache, facial or extremity weakness.  Pt denies polydipsia, polyuria, or low sugar symptoms. Pt states overall good compliance with treatment and medications, good tolerability, and has been trying to follow appropriate diet.  Pt denies worsening depressive symptoms, suicidal ideation or panic. No fever, night sweats, wt loss, loss of appetite, or other constitutional symptoms.  Pt states good ability with ADL's, has low fall risk, home safety reviewed and adequate, no other significant changes in hearing or vision, and only occasionally active with exercise.  Statin intolerant.  No other new complaints or interval hx Past Medical History:  Diagnosis Date  . ALLERGIC RHINITIS 05/19/2009  . Anemia, iron deficiency 06/09/2015  . 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 GERD (gastroesophageal reflux disease)   . Headache(784.0) 05/19/2009  . Occlusion and stenosis of carotid artery without mention of cerebral infarction    Past Surgical History:  Procedure Laterality Date  . ANTERIOR AND POSTERIOR REPAIR WITH SACROSPINOUS FIXATION N/A 05/16/2014   Procedure: ANTERIOR AND POSTERIOR REPAIR WITH SACROSPINOUS LIGAMENT SUSPENSION;  Surgeon: Luz Lex, MD;  Location: [facility];  Service: Gynecology;  Laterality: N/A;  . BARTHOLIN CYST MARSUPIALIZATION    . BUNIONECTOMY Right   . LAPAROSCOPIC ASSISTED VAGINAL HYSTERECTOMY N/A 05/16/2014   Procedure: LAPAROSCOPIC ASSISTED VAGINAL HYSTERECTOMY;  Surgeon: Luz Lex, MD;  Location: [facility];  Service: Gynecology;  Laterality: N/A;  . NEUROMA SURGERY Right    2nd interspace    reports that she has never smoked. She has never used smokeless tobacco. She  reports that she does not drink alcohol or use drugs. family history includes Alcohol abuse in her father; Hypertension in her mother; Prostate cancer in her maternal uncle. Allergies  Allergen Reactions  . Oxycontin [Oxycodone Hcl] Nausea Only and Other (See Comments)    FELT LIKE I WAS GOING TO FAINT  . Statins Other (See Comments)    myalgias   Current Outpatient Medications on File Prior to Visit  Medication Sig Dispense Refill  . acetaminophen (TYLENOL) 500 MG tablet Take 500 mg by mouth every 6 (six) hours as needed.    Marland Kitchen BIOTIN PO Take 500 mg by mouth daily. Reported on 04/30/2015    . calcium carbonate (OS-CAL) 600 MG TABS tablet Take 600 mg by mouth daily.    . cetirizine-pseudoephedrine (ZYRTEC-D) 5-120 MG per tablet Take 1 tablet by mouth once as needed for allergies.    . Cholecalciferol (VITAMIN D-3 PO) Take 4,000 Units by mouth daily.    . Nutritional Supplements (DHEA PO) Take 10 mg by mouth 3 (three) times a week.     . Omega-3 Fatty Acids (FISH OIL) 1000 MG CAPS Take 1,000 mg by mouth daily.    . pantoprazole (PROTONIX) 40 MG tablet TAKE 1 TABLET BY MOUTH EVERY DAY 90 tablet 2   No current facility-administered medications on file prior to visit.    Review of Systems Constitutional: Negative for other unusual diaphoresis, sweats, appetite or weight changes HENT: Negative for other worsening hearing loss, ear pain, facial swelling, mouth sores or neck stiffness.   Eyes: Negative for other worsening pain, redness or other visual disturbance.  Respiratory: Negative for  other stridor or swelling Cardiovascular: Negative for other palpitations or other chest pain  Gastrointestinal: Negative for worsening diarrhea or loose stools, blood in stool, distention or other pain Genitourinary: Negative for hematuria, flank pain or other change in urine volume.  Musculoskeletal: Negative for myalgias or other joint swelling.  Skin: Negative for other color change, or other wound or  worsening drainage.  Neurological: Negative for other syncope or numbness. Hematological: Negative for other adenopathy or swelling Psychiatric/Behavioral: Negative for hallucinations, other worsening agitation, SI, self-injury, or new decreased concentration All other system neg per pt    Objective:   Physical Exam BP 130/80 (BP Location: Left Arm, Patient Position: Sitting, Cuff Size: Normal)   Pulse 76   Temp 98.4 F (36.9 C) (Oral)   Ht _0  (1.651 m)   Wt 133 lb 12 oz (60.7 kg)   SpO2 95%   BMI 22.26 kg/m  VS noted,  Constitutional: Pt is oriented to person, place, and time. Appears well-developed and well-nourished, in no significant distress and comfortable Head: Normocephalic and atraumatic  Eyes: Conjunctivae and EOM are normal. Pupils are equal, round, and reactive to light Right Ear: External ear normal without discharge Left Ear: External ear normal without discharge Nose: Nose without discharge or deformity Mouth/Throat: Oropharynx is without other ulcerations and moist  Neck: Normal range of motion. Neck supple. No JVD present. No tracheal deviation present or significant neck LA or mass Cardiovascular: Normal rate, regular rhythm, normal heart sounds and intact distal pulses.   Pulmonary/Chest: WOB normal and breath sounds without rales or wheezing  Abdominal: Soft. Bowel sounds are normal. NT. No HSM  Musculoskeletal: Normal range of motion. Exhibits no edema Lymphadenopathy: Has no other cervical adenopathy.  Neurological: Pt is alert and oriented to person, place, and time. Pt has normal reflexes. No cranial nerve deficit. Motor grossly intact, Gait intact Skin: Skin is warm and dry. No rash noted or new ulcerations Psychiatric:  Has normal mood and affect. Behavior is normal without agitation No other exam findings Lab Results  Component Value Date   WBC 8.6 07/26/2017   HGB 12.9 07/26/2017   HCT 38.3 07/26/2017   PLT 315.0 07/26/2017   GLUCOSE 129 (H)  07/26/2017   CHOL 215 (H) 07/26/2017   TRIG 187.0 (H) 07/26/2017   HDL 42.60 07/26/2017   LDLDIRECT 147.0 07/19/2016   LDLCALC 135 (H) 07/26/2017   ALT 28 07/26/2017   AST 22 07/26/2017   NA 139 07/26/2017   K 3.8 07/26/2017   CL 103 07/26/2017   CREATININE 0.78 07/26/2017   BUN 14 07/26/2017   CO2 30 07/26/2017   TSH 1.52 07/26/2017      Assessment & Plan:

## 2017-07-26 NOTE — Patient Instructions (Signed)
Please continue all other medications as before, and refills have been done if requested.  Please have the pharmacy call with any other refills you may need.  Please continue your efforts at being more active, low cholesterol diet, and weight control.  You are otherwise up to date with prevention measures today.  Please keep your appointments with your specialists as you may have planned  Please go to the LAB in the Basement (turn left off the elevator) for the tests to be done today  You will be contacted by phone if any changes need to be made immediately.  Otherwise, you will receive a letter about your results with an explanation, but please check with MyChart first.  Please remember to sign up for MyChart if you have not done so, as this will be important to you in the future with finding out test results, communicating by private email, and scheduling acute appointments online when needed.  Please return in 1 year for your yearly visit, or sooner if needed, with Lab testing done 3-5 days before

## 2017-07-26 NOTE — Assessment & Plan Note (Signed)

## 2017-08-16 DIAGNOSIS — N951 Menopausal and female climacteric states: Secondary | ICD-10-CM | POA: Insufficient documentation

## 2017-12-11 ENCOUNTER — Encounter: Payer: Self-pay | Admitting: Internal Medicine

## 2017-12-11 DIAGNOSIS — N951 Menopausal and female climacteric states: Secondary | ICD-10-CM

## 2017-12-16 ENCOUNTER — Other Ambulatory Visit: Payer: Self-pay | Admitting: Obstetrics and Gynecology

## 2017-12-16 ENCOUNTER — Ambulatory Visit: Payer: Medicare Other | Admitting: Internal Medicine

## 2017-12-16 ENCOUNTER — Encounter: Payer: Self-pay | Admitting: Internal Medicine

## 2017-12-16 VITALS — BP 112/70 | HR 84 | Temp 98.4°F | Ht 65.0 in | Wt 131.2 lb

## 2017-12-16 DIAGNOSIS — Z1231 Encounter for screening mammogram for malignant neoplasm of breast: Secondary | ICD-10-CM

## 2017-12-16 DIAGNOSIS — R748 Abnormal levels of other serum enzymes: Secondary | ICD-10-CM | POA: Diagnosis not present

## 2017-12-16 DIAGNOSIS — N951 Menopausal and female climacteric states: Secondary | ICD-10-CM

## 2017-12-16 DIAGNOSIS — Z79890 Hormone replacement therapy (postmenopausal): Secondary | ICD-10-CM

## 2017-12-16 NOTE — Progress Notes (Signed)
  Subjective:     Patient ID: Donna Holmes , female    DOB: Feb 06, 1951 , 67 y.o.   MRN: [id number]   SHE IS HERE TODAY FOR F/U BHRT.  SHE IS DOING WELL ON HER CURRENT REGIMEN. SHE HAS BEEN TAKING BIEST CREAM DAILY, PROGESTERONE CAPSULES NIGHTLY AND USES VAGINAL TESTOSTERONE CREAM.     Past Medical History:  Diagnosis Date  . ALLERGIC RHINITIS 05/19/2009  . Anemia, iron deficiency 06/09/2015  . 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 GERD (gastroesophageal reflux disease)   . Headache(784.0) 05/19/2009  . Occlusion and stenosis of carotid artery without mention of cerebral infarction       Current Outpatient Medications:  .  acetaminophen (TYLENOL) 500 MG tablet, Take 500 mg by mouth every 6 (six) hours as needed., Disp: , Rfl:  .  BIOTIN PO, Take 500 mg by mouth daily. Reported on 04/30/2015, Disp: , Rfl:  .  calcium carbonate (OS-CAL) 600 MG TABS tablet, Take 600 mg by mouth daily., Disp: , Rfl:  .  cetirizine-pseudoephedrine (ZYRTEC-D) 5-120 MG per tablet, Take 1 tablet by mouth once as needed for allergies., Disp: , Rfl:  .  Cholecalciferol (VITAMIN D-3 PO), Take 4,000 Units by mouth daily., Disp: , Rfl:  .  Estradiol-Estriol-Progesterone (BIEST/PROGESTERONE) CREA, Place onto the skin 2 (two) times daily., Disp: , Rfl:  .  Nutritional Supplements (DHEA PO), Take 10 mg by mouth 3 (three) times a week. , Disp: , Rfl:  .  Omega-3 Fatty Acids (FISH OIL) 1000 MG CAPS, Take 1,000 mg by mouth daily., Disp: , Rfl:  .  pantoprazole (PROTONIX) 40 MG tablet, TAKE 1 TABLET BY MOUTH EVERY DAY, Disp: 90 tablet, Rfl: 2   Review of Systems  Constitutional: Negative.   HENT: Negative.   Respiratory: Negative.   Cardiovascular: Negative.   Gastrointestinal: Negative.   Psychiatric/Behavioral: Negative.      Today's Vitals   12/16/17 1214  BP: 112/70  Pulse: 84  Temp: 98.4 F (36.9 C)  TempSrc: Oral  Weight: 131 lb 3.2 oz (59.5 kg)  Height: 5' 5" (1.651 m)  PainSc: 0-No pain   Body mass index is  21.83 kg/m.   Objective:  Physical Exam  Constitutional: She is oriented to person, place, and time. She appears well-developed and well-nourished.  Eyes: EOM are normal.  Neck: Normal range of motion. Neck supple.  Cardiovascular: Normal rate, regular rhythm and normal heart sounds.  Pulmonary/Chest: Effort normal and breath sounds normal.  Neurological: She is alert and oriented to person, place, and time.  Skin: Skin is warm.  Psychiatric: She has a normal mood and affect. Her behavior is normal.        Assessment And Plan:     1. 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 WE DISCUSSED THE LENGTH OF TIME SHE HAS BEEN ON BHRT THERAPY, MORE THAN 5 YEARS. SHE WANTS TO CONTINUE WITH BHRT INDEFINITELY BECAUSE SHE REPORTS SHE FEELS WELL WHILE ON THIS REGIMEN. SHE IS ENCOURAGED TO STAY UP TO DATE WITH YEARLY MAMMOGRAMS, MONTHLY SBE AND PELVIC EXAMS.      2. Abnormal liver enzymes I WILL CHECK REPEAT LFTS TODAY. SHE IS ENCOURAGED TO AVOID PROCESSED/PACKAGED FOODS    - AST - ALT  3. Menopausal syndrome on hormone replacement therapy SHE WILL RTO IN FOUR MONTHS FOR RE-EVALUATION.  - CBC no Diff - Testosterone, Total        Maximino Greenland, MD

## 2017-12-17 LAB — CBC
HEMATOCRIT: 38.4 % (ref 34.0–46.6)
Hemoglobin: 12.8 g/dL (ref 11.1–15.9)
MCH: 29.3 pg (ref 26.6–33.0)
MCHC: 33.3 g/dL (ref 31.5–35.7)
MCV: 88 fL (ref 79–97)
Platelets: 307 10*3/uL (ref 150–450)
RBC: 4.37 x10E6/uL (ref 3.77–5.28)
RDW: 13.7 % (ref 12.3–15.4)
WBC: 7.7 10*3/uL (ref 3.4–10.8)

## 2017-12-17 LAB — ALT: ALT: 46 IU/L — AB (ref 0–32)

## 2017-12-17 LAB — TESTOSTERONE: Testosterone: 21 ng/dL (ref 3–41)

## 2017-12-17 LAB — AST: AST: 40 IU/L (ref 0–40)

## 2017-12-19 ENCOUNTER — Encounter: Payer: Self-pay | Admitting: Internal Medicine

## 2017-12-28 ENCOUNTER — Encounter: Payer: Self-pay | Admitting: Internal Medicine

## 2018-02-02 ENCOUNTER — Ambulatory Visit
Admission: RE | Admit: 2018-02-02 | Discharge: 2018-02-02 | Disposition: A | Discharge status: Home / Self Care | Payer: Medicare Other | Source: Ambulatory Visit | Attending: Obstetrics and Gynecology | Admitting: Obstetrics and Gynecology

## 2018-02-02 DIAGNOSIS — Z1231 Encounter for screening mammogram for malignant neoplasm of breast: Secondary | ICD-10-CM

## 2018-03-27 ENCOUNTER — Telehealth: Payer: Medicare PPO | Admitting: Family

## 2018-03-27 DIAGNOSIS — J019 Acute sinusitis, unspecified: Secondary | ICD-10-CM | POA: Diagnosis not present

## 2018-03-27 MED ORDER — AMOXICILLIN-POT CLAVULANATE 875-125 MG PO TABS: 1.0000 | ORAL_TABLET | Freq: Two times a day (BID) | ORAL | 0 refills | Status: DC

## 2018-03-27 NOTE — Progress Notes (Signed)
We are sorry that you are not feeling well.  Here is how we plan to help!  Based on what you have shared with me it looks like you have sinusitis.  Sinusitis is inflammation and infection in the sinus cavities of the head.  Based on your presentation I believe you most likely have Acute Bacterial Sinusitis.  This is an infection caused by bacteria and is treated with antibiotics. I have prescribed Augmentin 827m/125mg one tablet twice daily with food, for 7 days. You may use an oral decongestant such as Mucinex D or if you have glaucoma or high blood pressure use plain Mucinex. Saline nasal spray help and can safely be used as often as needed for congestion.  If you develop worsening sinus pain, fever or notice severe headache and vision changes, or if symptoms are not better after completion of antibiotic, please schedule an appointment with a health care provider.    Sinus infections are not as easily transmitted as other respiratory infection, however we still recommend that you avoid close contact with loved ones, especially the very young and elderly.  Remember to wash your hands thoroughly throughout the day as this is the number one way to prevent the spread of infection!  Home Care:  Only take medications as instructed by your medical team.  Complete the entire course of an antibiotic.  Do not take these medications with alcohol.  A steam or ultrasonic humidifier can help congestion.  You can place a towel over your head and breathe in the steam from hot water coming from a faucet.  Avoid close contacts especially the very young and the elderly.  Cover your mouth when you cough or sneeze.  Always remember to wash your hands.  Get Help Right Away If:  You develop worsening fever or sinus pain.  You develop a severe head ache or visual changes.  Your symptoms persist after you have completed your treatment plan.  Make sure you  Understand these instructions.  Will watch your  condition.  Will get help right away if you are not doing well or get worse.  Your e-visit answers were reviewed by a board certified advanced clinical practitioner to complete your personal care plan.  Depending on the condition, your plan could have included both over the counter or prescription medications.  If there is a problem please reply  once you have received a response from your provider.  Your safety is important to [place]  If you have drug allergies check your prescription carefully.    You can use MyChart to ask questions about today's visit, request a non-urgent call back, or ask for a work or school excuse for 24 hours related to this e-Visit. If it has been greater than 24 hours you will need to follow up with your provider, or enter a new e-Visit to address those concerns.  You will get an e-mail in the next two days asking about your experience.  I hope that your e-visit has been valuable and will speed your recovery. Thank you for using e-visits.

## 2018-04-13 ENCOUNTER — Encounter: Payer: Self-pay | Admitting: Internal Medicine

## 2018-04-19 ENCOUNTER — Encounter: Payer: Self-pay | Admitting: Internal Medicine

## 2018-04-19 ENCOUNTER — Ambulatory Visit: Payer: Medicare PPO | Admitting: Internal Medicine

## 2018-04-19 VITALS — BP 120/76 | HR 80 | Temp 97.8°F | Ht 62.8 in | Wt 131.8 lb

## 2018-04-19 DIAGNOSIS — R945 Abnormal results of liver function studies: Secondary | ICD-10-CM

## 2018-04-19 DIAGNOSIS — N951 Menopausal and female climacteric states: Secondary | ICD-10-CM | POA: Diagnosis not present

## 2018-04-19 DIAGNOSIS — R7989 Other specified abnormal findings of blood chemistry: Secondary | ICD-10-CM

## 2018-04-19 NOTE — Patient Instructions (Signed)

## 2018-04-21 LAB — HEPATIC FUNCTION PANEL
ALK PHOS: 68 IU/L (ref 39–117)
ALT: 41 IU/L — ABNORMAL HIGH (ref 0–32)
AST: 35 IU/L (ref 0–40)
Albumin: 4.9 g/dL — ABNORMAL HIGH (ref 3.8–4.8)
BILIRUBIN TOTAL: 0.5 mg/dL (ref 0.0–1.2)
BILIRUBIN, DIRECT: 0.13 mg/dL (ref 0.00–0.40)
TOTAL PROTEIN: 7.6 g/dL (ref 6.0–8.5)

## 2018-04-21 LAB — ESTRADIOL: Estradiol: <5 pg/mL

## 2018-04-21 LAB — TESTOSTERONE, FREE: Testosterone, Free: 1.9 pg/mL (ref 0.0–4.2)

## 2018-04-21 LAB — DHEA: Dehydroepiandrosterone: 145 ng/dL (ref 31–701)

## 2018-04-21 LAB — TESTOSTERONE: Testosterone: 24 ng/dL (ref 3–41)

## 2018-04-22 NOTE — Progress Notes (Addendum)
Subjective:     Patient ID: Latresha Yahr , female    DOB: 1951/01/16 , 68 y.o.   MRN: [id number]   Chief Complaint  Patient presents with  . hormones f/u    HPI  She is here today for f/u BHRT. She feels well on her current regimen.  She does not wish to make any changes to her regimen. She is aware of 5 years being max time on HRT; however, she does not wish to sacrifice the way she feels, for conventional medicine. She is willing to continue to have yearly mammograms.     Past Medical History:  Diagnosis Date  . ALLERGIC RHINITIS 05/19/2009  . Anemia, iron deficiency 06/09/2015  . 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 GERD (gastroesophageal reflux disease)   . Headache(784.0) 05/19/2009  . Occlusion and stenosis of carotid artery without mention of cerebral infarction      Family History  Problem Relation Age of Onset  . Alcohol abuse Father   . Hypertension Mother   . Prostate cancer Maternal Uncle      Current Outpatient Medications:  .  acetaminophen (TYLENOL) 500 MG tablet, Take 500 mg by mouth every 6 (six) hours as needed., Disp: , Rfl:  .  BIOTIN PO, Take 500 mg by mouth daily. Reported on 04/30/2015, Disp: , Rfl:  .  calcium carbonate (OS-CAL) 600 MG TABS tablet, Take 600 mg by mouth daily., Disp: , Rfl:  .  cetirizine-pseudoephedrine (ZYRTEC-D) 5-120 MG per tablet, Take 1 tablet by mouth once as needed for allergies., Disp: , Rfl:  .  Cholecalciferol (VITAMIN D-3 PO), Take 4,000 Units by mouth daily., Disp: , Rfl:  .  Estradiol-Estriol-Progesterone (BIEST/PROGESTERONE) CREA, Place onto the skin 2 (two) times daily., Disp: , Rfl:  .  Nutritional Supplements (DHEA PO), Take 10 mg by mouth 3 (three) times a week. , Disp: , Rfl:  .  Omega-3 Fatty Acids (FISH OIL) 1000 MG CAPS, Take 1,000 mg by mouth daily., Disp: , Rfl:  .  pantoprazole (PROTONIX) 40 MG tablet, TAKE 1 TABLET BY MOUTH EVERY DAY, Disp: 90 tablet, Rfl: 2   Allergies  Allergen Reactions  . Codeine Nausea And  Vomiting  . Oxycontin [Oxycodone Hcl] Nausea Only and Other (See Comments)    FELT LIKE I WAS GOING TO FAINT  . Statins Other (See Comments)    myalgias     Review of Systems  Constitutional: Negative.   Respiratory: Negative.   Cardiovascular: Negative.   Gastrointestinal: Negative.   Neurological: Negative.   Psychiatric/Behavioral: Negative.      Today's Vitals   04/19/18 1008  BP: 120/76  Pulse: 80  Temp: 97.8 F (36.6 C)  TempSrc: Oral  Weight: 131 lb 12.8 oz (59.8 kg)  Height: 5' 2.8" (1.595 m)  PainSc: 0-No pain   Body mass index is 23.5 kg/m.   Objective:  Physical Exam Vitals signs and nursing note reviewed.  Constitutional:      Appearance: Normal appearance.  HENT:     Head: Normocephalic and atraumatic.  Cardiovascular:     Rate and Rhythm: Normal rate and regular rhythm.     Heart sounds: Normal heart sounds.  Pulmonary:     Effort: Pulmonary effort is normal.     Breath sounds: Normal breath sounds.  Skin:    General: Skin is warm.  Neurological:     General: No focal deficit present.     Mental Status: She is alert.  Psychiatric:  Mood and Affect: Mood normal.         Assessment And Plan:     1. Female climacteric state  We had a long discussion regarding her treatment with BHRT. She no longer wishes to do saliva tests. She is advised that monitoring her hormone levels with saliva testing is how I was trained. And that this testing is more accurate than monitoring serum levels of her hormones. She is willing to accept this risk. We also discussed the length of time she has been on BHRT therapy (she was on this even prior to establishing care with me). She does not wish to stop therapy b/c she reports feeling so much better while on BHRT.  Possible increased risk of cancer was discussed with the patient, due to prolonged HRT exposure - again, she is willing to accept this risk. She did not have any questions that needed to be addressed. I  will check labs as listed below. She will rto in four months for re-evaluation. Greater than 50% of time was spent in counseling and coordination of care. I spent more than 25 minutes with the patient.   - Testosterone, Total - Testosterone, free - Estradiol - Liver Profile - DHEA  2. Abnormal LFTs  I will check repeat LFTs today.   Maximino Greenland, MD

## 2018-04-28 ENCOUNTER — Encounter: Payer: Self-pay | Admitting: Internal Medicine

## 2018-04-28 NOTE — Telephone Encounter (Signed)
Staff to assist pt with sat clinic appt

## 2018-04-29 ENCOUNTER — Ambulatory Visit: Payer: Medicare PPO | Admitting: Family Medicine

## 2018-04-29 ENCOUNTER — Encounter: Payer: Self-pay | Admitting: Family Medicine

## 2018-04-29 ENCOUNTER — Other Ambulatory Visit (HOSPITAL_COMMUNITY)
Admit: 2018-04-29 | Discharge: 2018-04-29 | Disposition: A | Discharge status: Home / Self Care | Payer: Medicare PPO | Source: Other Acute Inpatient Hospital | Attending: Family Medicine | Admitting: Family Medicine

## 2018-04-29 VITALS — BP 110/78 | HR 80 | Temp 97.7°F | Ht 62.8 in | Wt 132.0 lb

## 2018-04-29 DIAGNOSIS — R8290 Unspecified abnormal findings in urine: Secondary | ICD-10-CM | POA: Insufficient documentation

## 2018-04-29 DIAGNOSIS — N3000 Acute cystitis without hematuria: Secondary | ICD-10-CM

## 2018-04-29 LAB — POCT URINALYSIS DIPSTICK
Bilirubin, UA: NEGATIVE
Glucose, UA: NEGATIVE
KETONES UA: NEGATIVE
Leukocytes, UA: NEGATIVE
NITRITE UA: POSITIVE
PH UA: 6 (ref 5.0–8.0)
PROTEIN UA: NEGATIVE
RBC UA: NEGATIVE
Spec Grav, UA: 1.01 (ref 1.010–1.025)
UROBILINOGEN UA: 0.2 U/dL

## 2018-04-29 MED ORDER — CEFDINIR 300 MG PO CAPS: 300.0000 mg | ORAL_CAPSULE | Freq: Two times a day (BID) | ORAL | 0 refills | Status: AC

## 2018-04-29 NOTE — Progress Notes (Signed)
Subjective:    Patient ID: Donna Holmes, female    DOB: Sep 12, 1950, 68 y.o.   MRN: [id number]  HPI   Patient presents to clinic complaining of cloudy, foul-smelling urine for 2 weeks and also increased urinary frequency and urgency.  States it does not burn when she pees, but when she feels urge to urinate she must go to the bathroom right away.  Denies fever or chills.  Denies nausea/vomiting or diarrhea.  Denies abdominal pain.  Patient tries very hard to drink lots of water every day, at least 64 ounces daily  Patient Active Problem List   Diagnosis Date Noted  . Symptomatic menopausal or female climacteric states 08/16/2017  . Syncope 06/09/2015  . Dysuria 06/09/2015  . Anemia, iron deficiency 06/09/2015  . 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 Preventative health care 01/29/2014  . Hyperlipidemia 01/29/2014  . Drug rash 04/26/2011  . Chest tightness 01/06/2011  . Diarrhea 01/06/2011  . 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 Fatigue 12/29/2010  . CAROTID ARTERY DISEASE 01/27/2010  . Allergic rhinitis 05/19/2009  . Headache(784.0) 05/19/2009   Social History   Tobacco Use  . Smoking status: Never Smoker  . Smokeless tobacco: Never Used  Substance Use Topics  . Alcohol use: No    Review of Systems  Constitutional: Negative for chills, fatigue and fever.  HENT: Negative for congestion, ear pain, sinus pain and sore throat.   Eyes: Negative.   Respiratory: Negative for cough, shortness of breath and wheezing.   Cardiovascular: Negative for chest pain, palpitations and leg swelling.  Gastrointestinal: Negative for abdominal pain, diarrhea, nausea and vomiting.  Genitourinary: +frequency and urgency, cloudy and foul smelling urine.  Musculoskeletal: Negative for arthralgias and myalgias.  Skin: Negative for color change, pallor and rash.  Neurological: Negative for syncope, light-headedness and headaches.  Psychiatric/Behavioral: The  patient is not nervous/anxious.       Objective:   Physical Exam Vitals signs and nursing note reviewed.  Constitutional:      General: She is not in acute distress.    Appearance: She is well-developed. She is not toxic-appearing.  HENT:     Head: Normocephalic and atraumatic.  Eyes:     General: No scleral icterus.    Extraocular Movements: Extraocular movements intact.     Conjunctiva/sclera: Conjunctivae normal.  Neck:     Musculoskeletal: Neck supple. No neck rigidity.     Trachea: No tracheal deviation.  Cardiovascular:     Rate and Rhythm: Normal rate and regular rhythm.     Heart sounds: Normal heart sounds. No murmur. No friction rub. No gallop.   Pulmonary:     Effort: Pulmonary effort is normal. No respiratory distress.     Breath sounds: Normal breath sounds. No wheezing, rhonchi or rales.  Abdominal:     General: Bowel sounds are normal. There is no distension.     Palpations: Abdomen is soft.     Tenderness: There is abdominal tenderness (mild suprapubic tenderness). There is no guarding or rebound.  Musculoskeletal: Normal range of motion.        General: No deformity.  Skin:    General: Skin is warm and dry.     Coloration: Skin is not jaundiced or pale.  Neurological:     Mental Status: She is alert and oriented to person, place, and time.     Gait: Gait normal.  Psychiatric:        Mood and Affect: Mood normal.  Behavior: Behavior normal.    Vitals:   04/29/18 0909  BP: 110/78  Pulse: 80  Temp: 97.7 F (36.5 C)  SpO2: 96%       Assessment & Plan:   UTI without hematuria/cloudy urine/foul-smelling urine- patient's urinalysis dipstick is positive for nitrates missing combination with her symptoms and physical exam we will treat for urinary tract infection.  She will take Omnicef twice daily for 7 days.  We will send urine culture to lab to be sure we are on correct antibiotic.  Advised to keep up good water intake, avoid excess sugary  caffeinated beverages, wear cotton underwear and always do good handwashing.  Patient will keep regularly scheduled follow-up with PCP as planned, advised to return to clinic sooner if any issues arise

## 2018-04-29 NOTE — Addendum Note (Signed)
Addended by: Jasper Loser on: 04/29/2018 11:53 AM   Modules accepted: Orders

## 2018-05-02 LAB — URINE CULTURE: Culture: >=100000 — AB

## 2018-05-05 NOTE — Progress Notes (Addendum)
Subjective:   Donna Holmes is a 68 y.o. female who presents for Medicare Annual (Subsequent) preventive examination.  Review of Systems:  No ROS.  Medicare Wellness Visit. Additional risk factors are reflected in the social history.  Cardiac Risk Factors include: advanced age (>39mn, >>36women);dyslipidemia Sleep patterns: feels rested on waking, gets up 1 times nightly to void and sleeps 6-7 hours nightly.    Home Safety/Smoke Alarms: Feels safe in home. Smoke alarms in place.  Living environment; residence and Firearm Safety: 1-story house/ trailer. Lives with husband, no needs for DME, good support system Seat Belt Safety/Bike Helmet: Wears seat belt.     Objective:     Vitals: BP 114/62   Pulse 79   Resp 17   Ht 5' 3.5" (1.613 m)   Wt 131 lb (59.4 kg)   SpO2 100%   BMI 22.84 kg/m   Body mass index is 22.84 kg/m.  Advanced Directives 05/08/2018 05/06/2017 05/16/2014 05/01/2014  Does Patient Have a Medical Advance Directive? No No No No  Would patient like information on creating a medical advance directive? Yes (ED - Information included in AVS) Yes (ED - Information included in AVS) No - patient declined information No - patient declined information    Tobacco Social History   Tobacco Use  Smoking Status Never Smoker  Smokeless Tobacco Never Used     Counseling given: Not Answered  Past Medical History:  Diagnosis Date  . ALLERGIC RHINITIS 05/19/2009  . Anemia, iron deficiency 06/09/2015  . 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 Complication of anesthesia   . GERD (gastroesophageal reflux disease)   . Headache(784.0) 05/19/2009   Past Surgical History:  Procedure Laterality Date  . ANTERIOR AND POSTERIOR REPAIR WITH SACROSPINOUS FIXATION N/A 05/16/2014   Procedure: ANTERIOR AND POSTERIOR REPAIR WITH SACROSPINOUS LIGAMENT SUSPENSION;  Surgeon: DLuz Lex MD;  [facility];  Service: Gynecology;  Laterality: N/A;  . BARTHOLIN CYST MARSUPIALIZATION    .  BUNIONECTOMY Right   . LAPAROSCOPIC ASSISTED VAGINAL HYSTERECTOMY N/A 05/16/2014   Procedure: LAPAROSCOPIC ASSISTED VAGINAL HYSTERECTOMY;  Surgeon: DLuz Lex MD;  [facility];  Service: Gynecology;  Laterality: N/A;  . NEUROMA SURGERY Right    2nd interspace   Family History  Problem Relation Age of Onset  . Alcohol abuse Father   . Hypertension Mother   . Prostate cancer Maternal Uncle    Social History   Socioeconomic History  . Marital status: Married    Spouse name: Not on file  . Number of children: 1  . Years of education: Not on file  . Highest education level: Not on file  Occupational History  . 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 Financial resource strain: Not hard at all  . Food insecurity:    Worry: Never true    Inability: Never true  . Transportation needs:    Medical: No    Non-medical: No  Tobacco Use  . Smoking status: Never Smoker  . Smokeless tobacco: Never Used  Substance and Sexual Activity  . Alcohol use: No  . Drug use: No  . Sexual activity: Not Currently  Lifestyle  . Physical activity:    Days per week: 4 days    Minutes per session: 50 min  . Stress: Not at all  Relationships  . Social connections:    Talks on phone: More than three times a week    Gets together: More than three times a week  Attends religious service: More than 4 times per year    Active member of club or organization: Yes    Attends meetings of clubs or organizations: More than 4 times per year    Relationship status: Married  Other Topics Concern  . Not on file  Social History Narrative  . Not on file    Outpatient Encounter Medications as of 05/08/2018  Medication Sig  . acetaminophen (TYLENOL) 500 MG tablet Take 500 mg by mouth every 6 (six) hours as needed.  Marland Kitchen BIOTIN PO Take 500 mg by mouth daily. Reported on 04/30/2015  . calcium carbonate (OS-CAL) 600 MG TABS tablet Take 600 mg by mouth daily.  . cetirizine-pseudoephedrine (ZYRTEC-D) 5-120  MG per tablet Take 1 tablet by mouth once as needed for allergies.  . Cholecalciferol (VITAMIN D-3 PO) Take 4,000 Units by mouth daily.  . Estradiol-Estriol-Progesterone (BIEST/PROGESTERONE) CREA Place onto the skin 2 (two) times daily.  . Glucosamine-Chondroitin (GLUCOSAMINE CHONDR COMPLEX PO) Take 1 tablet by mouth daily.  . Nutritional Supplements (DHEA PO) Take 10 mg by mouth 3 (three) times a week.   . Omega-3 Fatty Acids (FISH OIL) 1000 MG CAPS Take 1,000 mg by mouth daily.  . pantoprazole (PROTONIX) 40 MG tablet TAKE 1 TABLET BY MOUTH EVERY DAY  . [EXPIRED] cefdinir (OMNICEF) 300 MG capsule Take 1 capsule (300 mg total) by mouth 2 (two) times daily for 7 days.  . [DISCONTINUED] pantoprazole (PROTONIX) 40 MG tablet TAKE 1 TABLET BY MOUTH EVERY DAY   No facility-administered encounter medications on file as of 05/08/2018.     Activities of Daily Living In your present state of health, do you have any difficulty performing the following activities: 05/08/2018  Hearing? N  Vision? N  Difficulty concentrating or making decisions? N  Walking or climbing stairs? N  Dressing or bathing? N  Doing errands, shopping? N  Preparing Food and eating ? N  Using the Toilet? N  In the past six months, have you accidently leaked urine? N  Do you have problems with loss of bowel control? N  Managing your Medications? N  Managing your Finances? N  Housekeeping or managing your Housekeeping? N  Some recent data might be hidden    Patient Care Team: Biagio Borg, MD as PCP - General (Internal Medicine)    Assessment:   This is a routine wellness examination for Donna Holmes. Physical assessment deferred to PCP.   Exercise Activities and Dietary recommendations Current Exercise Habits: Structured exercise class;Home exercise routine, Type of exercise: walking;yoga(tai chi), Time (Minutes): 50, Frequency (Times/Week): 6, Weekly Exercise (Minutes/Week): 300, Intensity: Mild, Exercise limited by: None  identified  Diet (meal preparation, eat out, water intake, caffeinated beverages, dairy products, fruits and vegetables): in general, a "healthy" diet  , well balanced. eats a variety of fruits and vegetables daily, limits salt, fat/cholesterol, sugar,carbohydrates,caffeine, drinks 6-8 glasses of water daily.   Goals    . 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 Patient Stated     Continue to exercise, build my arm muscles, maintain my strength and independence and stay active in church.        Fall Risk Fall Risk  05/08/2018 04/19/2018 12/16/2017 07/26/2017 05/06/2017  Falls in the past year? 0 0 No No No  Number falls in past yr: 0 - - - -    Depression  Screen PHQ 2/9 Scores 05/08/2018 04/19/2018 12/16/2017 07/26/2017  PHQ - 2 Score 0 0 0 1  PHQ- 9 Score - - - -     Cognitive Function       Ad8 score reviewed for issues:  Issues making decisions: no  Less interest in hobbies / activities: no  Repeats questions, stories (family complaining): no  Trouble using ordinary gadgets (microwave, computer, phone):no  Forgets the month or year: no  Mismanaging finances: no  Remembering appts: no  Daily problems with thinking and/or memory: no Ad8 score is= 0  Immunization History  Administered Date(s) Administered  . Influenza-Unspecified 12/02/2015, 12/21/2016, 12/28/2017  . Pneumococcal Conjugate-13 01/29/2014  . Pneumococcal Polysaccharide-23 05/06/2017  . Td 05/19/2009   Screening Tests Health Maintenance  Topic Date Due  . TETANUS/TDAP  05/20/2019  . MAMMOGRAM  02/03/2020  . COLONOSCOPY  05/11/2025  . INFLUENZA VACCINE  Completed  . DEXA SCAN  Completed  . Hepatitis C Screening  Completed  . PNA vac Low Risk Adult  Completed      Plan:     Reviewed health maintenance screenings with patient today and relevant education, vaccines, and/or referrals were provided.    Continue doing brain stimulating activities (puzzles, reading, adult coloring books, staying active) to keep memory sharp.   Continue to eat heart healthy diet (full of fruits, vegetables, whole grains, lean protein, water--limit salt, fat, and sugar intake) and increase physical activity as tolerated.  I have personally reviewed and noted the following in the patient's chart:   . Medical and social history . Use of alcohol, tobacco or illicit drugs  . Current medications and supplements . Functional ability and status . Nutritional status . Physical activity . Advanced directives . List of other physicians . Vitals . Screenings to include cognitive, depression, and falls . Referrals and appointments  In addition, I have reviewed and discussed with patient certain preventive protocols, quality metrics, and best practice recommendations. A written personalized care plan for preventive services as well as general preventive health recommendations were provided to patient.     Michiel Cowboy, RN  05/08/2018    Medical screening examination/treatment/procedure(s) were performed by non-physician practitioner and as supervising physician I was immediately available for consultation/collaboration. I agree with above. Binnie Rail, MD

## 2018-05-08 ENCOUNTER — Other Ambulatory Visit: Payer: Self-pay | Admitting: Internal Medicine

## 2018-05-08 ENCOUNTER — Ambulatory Visit (INDEPENDENT_AMBULATORY_CARE_PROVIDER_SITE_OTHER): Payer: Medicare PPO | Admitting: *Deleted

## 2018-05-08 VITALS — BP 114/62 | HR 79 | Resp 17 | Ht 63.5 in | Wt 131.0 lb

## 2018-05-08 DIAGNOSIS — Z0000 Encounter for general adult medical examination without abnormal findings: Secondary | ICD-10-CM | POA: Diagnosis not present

## 2018-05-08 NOTE — Patient Instructions (Addendum)
Continue doing brain stimulating activities (puzzles, reading, adult coloring books, staying active) to keep memory sharp.   Continue to eat heart healthy diet (full of fruits, vegetables, whole grains, lean protein, water--limit salt, fat, and sugar intake) and increase physical activity as tolerated.  [name] , Thank you for taking time to come for your Medicare Wellness Visit. I appreciate your ongoing commitment to your health goals. Please review the following plan we discussed and let me know if I can assist you in the future.   These are the goals we discussed: Goals    . 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 Patient Stated     Continue to exercise, build my arm muscles, maintain my strength and independence and stay active in church.        This is a list of the screening recommended for you and due dates:  Health Maintenance  Topic Date Due  . Tetanus Vaccine  05/20/2019  . Mammogram  02/03/2020  . Colon Cancer Screening  05/11/2025  . Flu Shot  Completed  . DEXA scan (bone density measurement)  Completed  .  Hepatitis C: One time screening is recommended by Center for Disease Control  (CDC) for  adults born from [birth year] through 1967.   Completed  . Pneumonia vaccines  Completed   Health Maintenance, Female Adopting a healthy lifestyle and getting preventive care can go a long way to promote health and wellness. Talk with your health care provider about what schedule of regular examinations is right for you. This is a good chance for you to check in with your provider about disease prevention and staying healthy. In between checkups, there are plenty of things you can do on your own. Experts have done a lot of research about which lifestyle changes and preventive measures are most likely to keep you healthy. Ask your health care provider for more information. Weight and  diet Eat a healthy diet  Be sure to include plenty of vegetables, fruits, low-fat dairy products, and lean protein.  Do not eat a lot of foods high in solid fats, added sugars, or salt.  Get regular exercise. This is one of the most important things you can do for your health. ? Most adults should exercise for at least 150 minutes each week. The exercise should increase your heart rate and make you sweat (moderate-intensity exercise). ? Most adults should also do strengthening exercises at least twice a week. This is in addition to the moderate-intensity exercise. Maintain a healthy weight  Body mass index (BMI) is a measurement that can be used to identify possible weight problems. It estimates body fat based on height and weight. Your health care provider can help determine your BMI and help you achieve or maintain a healthy weight.  For females 8 years of age and older: ? A BMI below 18.5 is considered underweight. ? A BMI of 18.5 to 24.9 is normal. ? A BMI of 25 to 29.9 is considered overweight. ? A BMI of 30 and above is considered obese. Watch levels of cholesterol and blood lipids  You should start having your blood tested for lipids and cholesterol at 70 years of age, then have this test every 5 years.  You may need to have your cholesterol levels checked more often if: ? Your lipid or cholesterol levels are high. ?  You are older than 70 years of age. ? You are at high risk for heart disease. Cancer screening Lung Cancer  Lung cancer screening is recommended for adults 20-68 years old who are at high risk for lung cancer because of a history of smoking.  A yearly low-dose CT scan of the lungs is recommended for people who: ? Currently smoke. ? Have quit within the past 15 years. ? Have at least a 30-pack-year history of smoking. A pack year is smoking an average of one pack of cigarettes a day for 1 year.  Yearly screening should continue until it has been 15 years since  you quit.  Yearly screening should stop if you develop a health problem that would prevent you from having lung cancer treatment. Breast Cancer  Practice breast self-awareness. This means understanding how your breasts normally appear and feel.  It also means doing regular breast self-exams. Let your health care provider know about any changes, no matter how small.  If you are in your 20s or 30s, you should have a clinical breast exam (CBE) by a health care provider every 1-3 years as part of a regular health exam.  If you are 64 or older, have a CBE every year. Also consider having a breast X-ray (mammogram) every year.  If you have a family history of breast cancer, talk to your health care provider about genetic screening.  If you are at high risk for breast cancer, talk to your health care provider about having an MRI and a mammogram every year.  Breast cancer gene (BRCA) assessment is recommended for women who have family members with BRCA-related cancers. BRCA-related cancers include: ? Breast. ? Ovarian. ? Tubal. ? Peritoneal cancers.  Results of the assessment will determine the need for genetic counseling and BRCA1 and BRCA2 testing. Cervical Cancer Your health care provider may recommend that you be screened regularly for cancer of the pelvic organs (ovaries, uterus, and vagina). This screening involves a pelvic examination, including checking for microscopic changes to the surface of your cervix (Pap test). You may be encouraged to have this screening done every 3 years, beginning at age 30.  For women ages 63-65, health care providers may recommend pelvic exams and Pap testing every 3 years, or they may recommend the Pap and pelvic exam, combined with testing for human papilloma virus (HPV), every 5 years. Some types of HPV increase your risk of cervical cancer. Testing for HPV may also be done on women of any age with unclear Pap test results.  Other health care providers  may not recommend any screening for nonpregnant women who are considered low risk for pelvic cancer and who do not have symptoms. Ask your health care provider if a screening pelvic exam is right for you.  If you have had past treatment for cervical cancer or a condition that could lead to cancer, you need Pap tests and screening for cancer for at least 20 years after your treatment. If Pap tests have been discontinued, your risk factors (such as having a new sexual partner) need to be reassessed to determine if screening should resume. Some women have medical problems that increase the chance of getting cervical cancer. In these cases, your health care provider may recommend more frequent screening and Pap tests. Colorectal Cancer  This type of cancer can be detected and often prevented.  Routine colorectal cancer screening usually begins at 70 years of age and continues through 70 years of age.  Your health  care provider may recommend screening at an earlier age if you have risk factors for colon cancer.  Your health care provider may also recommend using home test kits to check for hidden blood in the stool.  A small camera at the end of a tube can be used to examine your colon directly (sigmoidoscopy or colonoscopy). This is done to check for the earliest forms of colorectal cancer.  Routine screening usually begins at age 69.  Direct examination of the colon should be repeated every 5-10 years through 70 years of age. However, you may need to be screened more often if early forms of precancerous polyps or small growths are found. Skin Cancer  Check your skin from head to toe regularly.  Tell your health care provider about any new moles or changes in moles, especially if there is a change in a mole's shape or color.  Also tell your health care provider if you have a mole that is larger than the size of a pencil eraser.  Always use sunscreen. Apply sunscreen liberally and repeatedly  throughout the day.  Protect yourself by wearing long sleeves, pants, a wide-brimmed hat, and sunglasses whenever you are outside. Heart disease, diabetes, and high blood pressure  High blood pressure causes heart disease and increases the risk of stroke. High blood pressure is more likely to develop in: ? People who have blood pressure in the high end of the normal range (130-139/85-89 mm Hg). ? People who are overweight or obese. ? People who are African American.  If you are 8-66 years of age, have your blood pressure checked every 3-5 years. If you are 82 years of age or older, have your blood pressure checked every year. You should have your blood pressure measured twice-once when you are at a hospital or clinic, and once when you are not at a hospital or clinic. Record the average of the two measurements. To check your blood pressure when you are not at a hospital or clinic, you can use: ? An automated blood pressure machine at a pharmacy. ? A home blood pressure monitor.  If you are between 64 years and 22 years old, ask your health care provider if you should take aspirin to prevent strokes.  Have regular diabetes screenings. This involves taking a blood sample to check your fasting blood sugar level. ? If you are at a normal weight and have a low risk for diabetes, have this test once every three years after 70 years of age. ? If you are overweight and have a high risk for diabetes, consider being tested at a younger age or more often. Preventing infection Hepatitis B  If you have a higher risk for hepatitis B, you should be screened for this virus. You are considered at high risk for hepatitis B if: ? You were born in a country where hepatitis B is common. Ask your health care provider which countries are considered high risk. ? Your parents were born in a high-risk country, and you have not been immunized against hepatitis B (hepatitis B vaccine). ? You have HIV or AIDS. ? You  use needles to inject street drugs. ? You live with someone who has hepatitis B. ? You have had sex with someone who has hepatitis B. ? You get hemodialysis treatment. ? You take certain medicines for conditions, including cancer, organ transplantation, and autoimmune conditions. Hepatitis C  Blood testing is recommended for: ? Everyone born from [birth year] through 1967. ? Anyone with known  risk factors for hepatitis C. Sexually transmitted infections (STIs)  You should be screened for sexually transmitted infections (STIs) including gonorrhea and chlamydia if: ? You are sexually active and are younger than 70 years of age. ? You are older than 70 years of age and your health care provider tells you that you are at risk for this type of infection. ? Your sexual activity has changed since you were last screened and you are at an increased risk for chlamydia or gonorrhea. Ask your health care provider if you are at risk.  If you do not have HIV, but are at risk, it may be recommended that you take a prescription medicine daily to prevent HIV infection. This is called pre-exposure prophylaxis (PrEP). You are considered at risk if: ? You are sexually active and do not regularly use condoms or know the HIV status of your partner(s). ? You take drugs by injection. ? You are sexually active with a partner who has HIV. Talk with your health care provider about whether you are at high risk of being infected with HIV. If you choose to begin PrEP, you should first be tested for HIV. You should then be tested every 3 months for as long as you are taking PrEP. Pregnancy  If you are premenopausal and you may become pregnant, ask your health care provider about preconception counseling.  If you may become pregnant, take 400 to 800 micrograms (mcg) of folic acid every day.  If you want to prevent pregnancy, talk to your health care provider about birth control (contraception). Osteoporosis and  menopause  Osteoporosis is a disease in which the bones lose minerals and strength with aging. This can result in serious bone fractures. Your risk for osteoporosis can be identified using a bone density scan.  If you are 37 years of age or older, or if you are at risk for osteoporosis and fractures, ask your health care provider if you should be screened.  Ask your health care provider whether you should take a calcium or vitamin D supplement to lower your risk for osteoporosis.  Menopause may have certain physical symptoms and risks.  Hormone replacement therapy may reduce some of these symptoms and risks. Talk to your health care provider about whether hormone replacement therapy is right for you. Follow these instructions at home:  Schedule regular health, dental, and eye exams.  Stay current with your immunizations.  Do not use any tobacco products including cigarettes, chewing tobacco, or electronic cigarettes.  If you are pregnant, do not drink alcohol.  If you are breastfeeding, limit how much and how often you drink alcohol.  Limit alcohol intake to no more than 1 drink per day for nonpregnant women. One drink equals 12 ounces of beer, 5 ounces of wine, or 1 ounces of hard liquor.  Do not use street drugs.  Do not share needles.  Ask your health care provider for help if you need support or information about quitting drugs.  Tell your health care provider if you often feel depressed.  Tell your health care provider if you have ever been abused or do not feel safe at home. This information is not intended to replace advice given to you by your health care provider. Make sure you discuss any questions you have with your health care provider. Document Released: 09/14/2010 Document Revised: 08/07/2015 Document Reviewed: 12/03/2014 Elsevier Interactive Patient Education  2019 [employer].

## 2018-06-27 ENCOUNTER — Other Ambulatory Visit: Payer: Self-pay | Admitting: Internal Medicine

## 2018-06-27 ENCOUNTER — Telehealth: Payer: Self-pay

## 2018-06-27 ENCOUNTER — Other Ambulatory Visit (INDEPENDENT_AMBULATORY_CARE_PROVIDER_SITE_OTHER): Payer: Medicare PPO

## 2018-06-27 ENCOUNTER — Encounter: Payer: Self-pay | Admitting: Internal Medicine

## 2018-06-27 DIAGNOSIS — R300 Dysuria: Secondary | ICD-10-CM

## 2018-06-27 LAB — URINALYSIS, ROUTINE W REFLEX MICROSCOPIC
Bilirubin Urine: NEGATIVE
Hgb urine dipstick: NEGATIVE
Nitrite: POSITIVE — AB
Specific Gravity, Urine: 1.02 (ref 1.000–1.030)
Urine Glucose: NEGATIVE
Urobilinogen, UA: 0.2 (ref 0.0–1.0)
pH: 5.5 (ref 5.0–8.0)

## 2018-06-27 MED ORDER — CEPHALEXIN 500 MG PO CAPS: 500.0000 mg | ORAL_CAPSULE | Freq: Three times a day (TID) | ORAL | 0 refills | Status: AC

## 2018-06-27 NOTE — Telephone Encounter (Signed)
-----  Message from Biagio Borg, MD sent at 06/27/2018  3:20 PM EDT ----- Left message on MyChart, pt to cont same tx except  The test results show that your current treatment is OK, except the urine testing does seem consistent with infection.  I will send the antibiotic, and you should hear from the office as well.  The urine culture is pending.    Aracelly Tencza to please inform pt, I will do rx

## 2018-06-27 NOTE — Telephone Encounter (Signed)
Pt has been informed and expressed understanding.  

## 2018-06-29 LAB — URINE CULTURE
MICRO NUMBER:: 394329
SPECIMEN QUALITY:: ADEQUATE

## 2018-07-04 DIAGNOSIS — J309 Allergic rhinitis, unspecified: Secondary | ICD-10-CM | POA: Diagnosis not present

## 2018-07-04 DIAGNOSIS — K219 Gastro-esophageal reflux disease without esophagitis: Secondary | ICD-10-CM | POA: Diagnosis not present

## 2018-07-13 ENCOUNTER — Encounter: Payer: Self-pay | Admitting: Internal Medicine

## 2018-07-26 ENCOUNTER — Telehealth: Payer: Self-pay

## 2018-07-26 NOTE — Telephone Encounter (Signed)
ERROR

## 2018-07-31 ENCOUNTER — Encounter: Payer: Self-pay | Admitting: Internal Medicine

## 2018-07-31 ENCOUNTER — Other Ambulatory Visit: Payer: Self-pay

## 2018-07-31 ENCOUNTER — Encounter: Payer: Medicare Other | Admitting: Internal Medicine

## 2018-07-31 ENCOUNTER — Ambulatory Visit (INDEPENDENT_AMBULATORY_CARE_PROVIDER_SITE_OTHER): Payer: Medicare PPO | Admitting: Internal Medicine

## 2018-07-31 VITALS — BP 102/62 | HR 79 | Temp 98.4°F | Ht 63.5 in | Wt 133.0 lb

## 2018-07-31 DIAGNOSIS — R8290 Unspecified abnormal findings in urine: Secondary | ICD-10-CM | POA: Diagnosis not present

## 2018-07-31 DIAGNOSIS — E538 Deficiency of other specified B group vitamins: Secondary | ICD-10-CM | POA: Diagnosis not present

## 2018-07-31 DIAGNOSIS — Z0000 Encounter for general adult medical examination without abnormal findings: Secondary | ICD-10-CM | POA: Diagnosis not present

## 2018-07-31 DIAGNOSIS — E559 Vitamin D deficiency, unspecified: Secondary | ICD-10-CM

## 2018-07-31 DIAGNOSIS — D509 Iron deficiency anemia, unspecified: Secondary | ICD-10-CM | POA: Diagnosis not present

## 2018-07-31 DIAGNOSIS — R739 Hyperglycemia, unspecified: Secondary | ICD-10-CM | POA: Insufficient documentation

## 2018-07-31 NOTE — Assessment & Plan Note (Signed)
Mild, asympt, for a1c with labs

## 2018-07-31 NOTE — Patient Instructions (Signed)

## 2018-07-31 NOTE — Assessment & Plan Note (Signed)

## 2018-07-31 NOTE — Assessment & Plan Note (Signed)
For Iron panel with labs

## 2018-07-31 NOTE — Progress Notes (Signed)
Subjective:    Patient ID: Donna Holmes, female    DOB: [birth year], 68 y.o.   MRN: [id number]  HPI  Here for wellness and f/u;  Overall doing ok;  Pt denies Chest pain, worsening SOB, DOE, wheezing, orthopnea, PND, worsening LE edema, palpitations, dizziness or syncope.  Pt denies neurological change such as new headache, facial or extremity weakness.  Pt denies polydipsia, polyuria, or low sugar symptoms. Pt states overall good compliance with treatment and medications, good tolerability, and has been trying to follow appropriate diet.  Pt denies worsening depressive symptoms, suicidal ideation or panic. No fever, night sweats, wt loss, loss of appetite, or other constitutional symptoms.  Pt states good ability with ADL's, has low fall risk, home safety reviewed and adequate, no other significant changes in hearing or vision, and only occasionally active with exercise.  No new complaints exceot for recent onset unusual urine odor, wondering about infection like the klebsiella UTI from 06/2018 Past Medical History:  Diagnosis Date  . ALLERGIC RHINITIS 05/19/2009  . Anemia, iron deficiency 06/09/2015  . 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 Complication of anesthesia   . GERD (gastroesophageal reflux disease)   . Headache(784.0) 05/19/2009   Past Surgical History:  Procedure Laterality Date  . ANTERIOR AND POSTERIOR REPAIR WITH SACROSPINOUS FIXATION N/A 05/16/2014   Procedure: ANTERIOR AND POSTERIOR REPAIR WITH SACROSPINOUS LIGAMENT SUSPENSION;  Surgeon: Luz Lex, MD;  Location: [facility];  Service: Gynecology;  Laterality: N/A;  . BARTHOLIN CYST MARSUPIALIZATION    . BUNIONECTOMY Right   . LAPAROSCOPIC ASSISTED VAGINAL HYSTERECTOMY N/A 05/16/2014   Procedure: LAPAROSCOPIC ASSISTED VAGINAL HYSTERECTOMY;  Surgeon: Luz Lex, MD;  Location: [facility];  Service: Gynecology;  Laterality: N/A;  . NEUROMA SURGERY Right    2nd interspace    reports that she has never smoked. She has never used smokeless  tobacco. She reports that she does not drink alcohol or use drugs. family history includes Alcohol abuse in her father; Hypertension in her mother; Prostate cancer in her maternal uncle. Allergies  Allergen Reactions  . Codeine Nausea And Vomiting  . Oxycontin [Oxycodone Hcl] Nausea Only and Other (See Comments)    FELT LIKE I WAS GOING TO FAINT  . Statins Other (See Comments)    myalgias   Current Outpatient Medications on File Prior to Visit  Medication Sig Dispense Refill  . acetaminophen (TYLENOL) 500 MG tablet Take 500 mg by mouth every 6 (six) hours as needed.    Marland Kitchen BIOTIN PO Take 500 mg by mouth daily. Reported on 04/30/2015    . calcium carbonate (OS-CAL) 600 MG TABS tablet Take 600 mg by mouth daily.    . cetirizine-pseudoephedrine (ZYRTEC-D) 5-120 MG per tablet Take 1 tablet by mouth once as needed for allergies.    . Cholecalciferol (VITAMIN D-3 PO) Take 4,000 Units by mouth daily.    . Estradiol-Estriol-Progesterone (BIEST/PROGESTERONE) CREA Place onto the skin 2 (two) times daily.    . Glucosamine-Chondroitin (GLUCOSAMINE CHONDR COMPLEX PO) Take 1 tablet by mouth daily.    . Nutritional Supplements (DHEA PO) Take 10 mg by mouth 3 (three) times a week.     . Omega-3 Fatty Acids (FISH OIL) 1000 MG CAPS Take 1,000 mg by mouth daily.    . pantoprazole (PROTONIX) 40 MG tablet TAKE 1 TABLET BY MOUTH EVERY DAY 90 tablet 2   No current facility-administered medications on file prior to visit.    Review of Systems Constitutional: Negative for other unusual diaphoresis, sweats,  appetite or weight changes HENT: Negative for other worsening hearing loss, ear pain, facial swelling, mouth sores or neck stiffness.   Eyes: Negative for other worsening pain, redness or other visual disturbance.  Respiratory: Negative for other stridor or swelling Cardiovascular: Negative for other palpitations or other chest pain  Gastrointestinal: Negative for worsening diarrhea or loose stools, blood in  stool, distention or other pain Genitourinary: Negative for hematuria, flank pain or other change in urine volume.  Musculoskeletal: Negative for myalgias or other joint swelling.  Skin: Negative for other color change, or other wound or worsening drainage.  Neurological: Negative for other syncope or numbness. Hematological: Negative for other adenopathy or swelling Psychiatric/Behavioral: Negative for hallucinations, other worsening agitation, SI, self-injury, or new decreased concentration All other system neg per pt    Objective:   Physical Exam BP 102/62 (BP Location: Left Arm, Patient Position: Sitting, Cuff Size: Normal)   Pulse 79   Temp 98.4 F (36.9 C) (Oral)   Ht 5' 3.5" (1.613 m)   Wt 133 lb (60.3 kg)   SpO2 97%   BMI 23.19 kg/m  VS noted,  Constitutional: Pt is oriented to person, place, and time. Appears well-developed and well-nourished, in no significant distress and comfortable Head: Normocephalic and atraumatic  Eyes: Conjunctivae and EOM are normal. Pupils are equal, round, and reactive to light Right Ear: External ear normal without discharge Left Ear: External ear normal without discharge Nose: Nose without discharge or deformity Mouth/Throat: Oropharynx is without other ulcerations and moist  Neck: Normal range of motion. Neck supple. No JVD present. No tracheal deviation present or significant neck LA or mass Cardiovascular: Normal rate, regular rhythm, normal heart sounds and intact distal pulses.   Pulmonary/Chest: WOB normal and breath sounds without rales or wheezing  Abdominal: Soft. Bowel sounds are normal. NT. No HSM  Musculoskeletal: Normal range of motion. Exhibits no edema Lymphadenopathy: Has no other cervical adenopathy.  Neurological: Pt is alert and oriented to person, place, and time. Pt has normal reflexes. No cranial nerve deficit. Motor grossly intact, Gait intact Skin: Skin is warm and dry. No rash noted or new ulcerations Psychiatric:   Has normal mood and affect. Behavior is normal without agitation No other exam findings Lab Results  Component Value Date   WBC 7.7 12/16/2017   HGB 12.8 12/16/2017   HCT 38.4 12/16/2017   PLT 307 12/16/2017   GLUCOSE 129 (H) 07/26/2017   CHOL 215 (H) 07/26/2017   TRIG 187.0 (H) 07/26/2017   HDL 42.60 07/26/2017   LDLDIRECT 147.0 07/19/2016   LDLCALC 135 (H) 07/26/2017   ALT 41 (H) 04/19/2018   AST 35 04/19/2018   NA 139 07/26/2017   K 3.8 07/26/2017   CL 103 07/26/2017   CREATININE 0.78 07/26/2017   BUN 14 07/26/2017   CO2 30 07/26/2017   TSH 1.52 07/26/2017        Assessment & Plan:

## 2018-08-01 ENCOUNTER — Other Ambulatory Visit (INDEPENDENT_AMBULATORY_CARE_PROVIDER_SITE_OTHER): Payer: Medicare PPO

## 2018-08-01 DIAGNOSIS — E538 Deficiency of other specified B group vitamins: Secondary | ICD-10-CM

## 2018-08-01 DIAGNOSIS — R8290 Unspecified abnormal findings in urine: Secondary | ICD-10-CM | POA: Diagnosis not present

## 2018-08-01 DIAGNOSIS — Z0000 Encounter for general adult medical examination without abnormal findings: Secondary | ICD-10-CM

## 2018-08-01 DIAGNOSIS — R739 Hyperglycemia, unspecified: Secondary | ICD-10-CM

## 2018-08-01 DIAGNOSIS — E559 Vitamin D deficiency, unspecified: Secondary | ICD-10-CM | POA: Diagnosis not present

## 2018-08-01 DIAGNOSIS — D509 Iron deficiency anemia, unspecified: Secondary | ICD-10-CM | POA: Diagnosis not present

## 2018-08-01 LAB — IBC PANEL
Iron: 80 ug/dL (ref 42–145)
Saturation Ratios: 19.2 % — ABNORMAL LOW (ref 20.0–50.0)
Transferrin: 297 mg/dL (ref 212.0–360.0)

## 2018-08-01 LAB — URINALYSIS, ROUTINE W REFLEX MICROSCOPIC
Bilirubin Urine: NEGATIVE
Hgb urine dipstick: NEGATIVE
Ketones, ur: NEGATIVE
Leukocytes,Ua: NEGATIVE
Nitrite: POSITIVE — AB
Specific Gravity, Urine: 1.02 (ref 1.000–1.030)
Total Protein, Urine: NEGATIVE
Urine Glucose: NEGATIVE
Urobilinogen, UA: 0.2 (ref 0.0–1.0)
pH: 6.5 (ref 5.0–8.0)

## 2018-08-01 LAB — CBC WITH DIFFERENTIAL/PLATELET
Basophils Absolute: 0 10*3/uL (ref 0.0–0.1)
Basophils Relative: 0.6 % (ref 0.0–3.0)
Eosinophils Absolute: 0.3 10*3/uL (ref 0.0–0.7)
Eosinophils Relative: 4.1 % (ref 0.0–5.0)
HCT: 38.5 % (ref 36.0–46.0)
Hemoglobin: 13 g/dL (ref 12.0–15.0)
Lymphocytes Relative: 37.6 % (ref 12.0–46.0)
Lymphs Abs: 2.6 10*3/uL (ref 0.7–4.0)
MCHC: 33.9 g/dL (ref 30.0–36.0)
MCV: 86.4 fl (ref 78.0–100.0)
Monocytes Absolute: 0.5 10*3/uL (ref 0.1–1.0)
Monocytes Relative: 7.8 % (ref 3.0–12.0)
Neutro Abs: 3.4 10*3/uL (ref 1.4–7.7)
Neutrophils Relative %: 49.9 % (ref 43.0–77.0)
Platelets: 278 10*3/uL (ref 150.0–400.0)
RBC: 4.45 Mil/uL (ref 3.87–5.11)
RDW: 13.8 % (ref 11.5–15.5)
WBC: 6.8 10*3/uL (ref 4.0–10.5)

## 2018-08-01 LAB — LIPID PANEL
Cholesterol: 196 mg/dL (ref 0–200)
HDL: 40.6 mg/dL (ref >39.00)
LDL Cholesterol: 124 mg/dL — ABNORMAL HIGH (ref 0–99)
NonHDL: 155.53
Total CHOL/HDL Ratio: 5
Triglycerides: 160 mg/dL — ABNORMAL HIGH (ref 0.0–149.0)
VLDL: 32 mg/dL (ref 0.0–40.0)

## 2018-08-01 LAB — VITAMIN D 25 HYDROXY (VIT D DEFICIENCY, FRACTURES): VITD: 77.67 ng/mL (ref 30.00–100.00)

## 2018-08-01 LAB — HEPATIC FUNCTION PANEL
ALT: 28 U/L (ref 0–35)
AST: 24 U/L (ref 0–37)
Albumin: 4.6 g/dL (ref 3.5–5.2)
Alkaline Phosphatase: 60 U/L (ref 39–117)
Bilirubin, Direct: 0.1 mg/dL (ref 0.0–0.3)
Total Bilirubin: 0.7 mg/dL (ref 0.2–1.2)
Total Protein: 7.6 g/dL (ref 6.0–8.3)

## 2018-08-01 LAB — BASIC METABOLIC PANEL
BUN: 22 mg/dL (ref 6–23)
CO2: 29 mEq/L (ref 19–32)
Calcium: 9.6 mg/dL (ref 8.4–10.5)
Chloride: 103 mEq/L (ref 96–112)
Creatinine, Ser: 0.8 mg/dL (ref 0.40–1.20)
GFR: 70.84 mL/min (ref >60.00)
Glucose, Bld: 92 mg/dL (ref 70–99)
Potassium: 4.4 mEq/L (ref 3.5–5.1)
Sodium: 139 mEq/L (ref 135–145)

## 2018-08-01 LAB — TSH: TSH: 1.48 u[IU]/mL (ref 0.35–4.50)

## 2018-08-01 LAB — HEMOGLOBIN A1C: Hgb A1c MFr Bld: 6.2 % (ref 4.6–6.5)

## 2018-08-01 LAB — VITAMIN B12: Vitamin B-12: 191 pg/mL — ABNORMAL LOW (ref 211–911)

## 2018-08-02 ENCOUNTER — Encounter: Payer: Self-pay | Admitting: Internal Medicine

## 2018-08-02 DIAGNOSIS — E538 Deficiency of other specified B group vitamins: Secondary | ICD-10-CM

## 2018-08-02 HISTORY — DX: Deficiency of other specified B group vitamins: E53.8

## 2018-08-03 ENCOUNTER — Telehealth: Payer: Self-pay

## 2018-08-03 ENCOUNTER — Telehealth: Payer: Self-pay | Admitting: Internal Medicine

## 2018-08-03 ENCOUNTER — Other Ambulatory Visit: Payer: Self-pay | Admitting: Internal Medicine

## 2018-08-03 LAB — URINE CULTURE
MICRO NUMBER:: 487356
SPECIMEN QUALITY:: ADEQUATE

## 2018-08-03 MED ORDER — CEPHALEXIN 500 MG PO CAPS: 500.0000 mg | ORAL_CAPSULE | Freq: Three times a day (TID) | ORAL | 0 refills | Status: AC

## 2018-08-03 NOTE — Telephone Encounter (Signed)
Patient scheduled for 5/26 ---b12 injection to be given in parking lot--patient advised of good food choices with high b12 consistency she can also add to her diet as we try to increase her lab level.

## 2018-08-03 NOTE — Telephone Encounter (Signed)
Jonelle Sidle, can you please schedule patient for B12.  Dr. Jenny Reichmann placed order. I have also given patient Dr. Gwynn Burly response on recent labs.

## 2018-08-03 NOTE — Telephone Encounter (Signed)
-----  Message from Biagio Borg, MD sent at 08/02/2018  7:57 PM EDT ----- Left message on MyChart, pt to cont same tx except  The test results show that your current treatment is OK, except the Vitamin B12 level is low.  Please have monthly Vitamin B12 shots at the office.  I will ask the office to let you know and help schedule.    Sri Clegg to please inform pt, and assist with scheduling for monthly B12 shots

## 2018-08-03 NOTE — Telephone Encounter (Signed)
Pt has been informed of results and expressed understanding.  Refer to previous phone msg.

## 2018-08-08 ENCOUNTER — Ambulatory Visit (INDEPENDENT_AMBULATORY_CARE_PROVIDER_SITE_OTHER): Payer: Medicare PPO

## 2018-08-08 DIAGNOSIS — E538 Deficiency of other specified B group vitamins: Secondary | ICD-10-CM | POA: Diagnosis not present

## 2018-08-08 MED ORDER — CYANOCOBALAMIN 1000 MCG/ML IJ SOLN
1000.0000 ug | Freq: Once | INTRAMUSCULAR | Status: AC
  Administered 2018-08-08: 1000 ug via INTRAMUSCULAR

## 2018-08-08 NOTE — Progress Notes (Signed)
Medical screening examination/treatment/procedure(s) were performed by non-physician practitioner and as supervising physician I was immediately available for consultation/collaboration. I agree with above. James John, MD   

## 2018-08-12 ENCOUNTER — Encounter: Payer: Self-pay | Admitting: Internal Medicine

## 2018-08-16 DIAGNOSIS — H16223 Keratoconjunctivitis sicca, not specified as Sjogren's, bilateral: Secondary | ICD-10-CM | POA: Diagnosis not present

## 2018-08-16 DIAGNOSIS — H43813 Vitreous degeneration, bilateral: Secondary | ICD-10-CM | POA: Diagnosis not present

## 2018-09-06 ENCOUNTER — Ambulatory Visit: Payer: Medicare PPO | Admitting: Internal Medicine

## 2018-09-08 ENCOUNTER — Ambulatory Visit (INDEPENDENT_AMBULATORY_CARE_PROVIDER_SITE_OTHER): Payer: Medicare PPO

## 2018-09-08 DIAGNOSIS — E538 Deficiency of other specified B group vitamins: Secondary | ICD-10-CM

## 2018-09-08 MED ORDER — CYANOCOBALAMIN 1000 MCG/ML IJ SOLN
1000.0000 ug | Freq: Once | INTRAMUSCULAR | Status: AC
  Administered 2018-09-08: 1000 ug via INTRAMUSCULAR

## 2018-09-08 NOTE — Progress Notes (Signed)
Medical screening examination/treatment/procedure(s) were performed by non-physician practitioner and as supervising physician I was immediately available for consultation/collaboration. I agree with above. Koral Thaden, MD   

## 2018-10-06 ENCOUNTER — Ambulatory Visit (INDEPENDENT_AMBULATORY_CARE_PROVIDER_SITE_OTHER): Payer: Medicare PPO | Admitting: *Deleted

## 2018-10-06 DIAGNOSIS — E538 Deficiency of other specified B group vitamins: Secondary | ICD-10-CM

## 2018-10-06 MED ORDER — CYANOCOBALAMIN 1000 MCG/ML IJ SOLN
1000.0000 ug | Freq: Once | INTRAMUSCULAR | Status: AC
  Administered 2018-10-06: 1000 ug via INTRAMUSCULAR

## 2018-10-06 NOTE — Progress Notes (Signed)
Pls cosign for b12 injection

## 2018-10-19 DIAGNOSIS — H16223 Keratoconjunctivitis sicca, not specified as Sjogren's, bilateral: Secondary | ICD-10-CM | POA: Diagnosis not present

## 2018-10-31 ENCOUNTER — Ambulatory Visit (INDEPENDENT_AMBULATORY_CARE_PROVIDER_SITE_OTHER): Payer: Medicare PPO | Admitting: Internal Medicine

## 2018-10-31 ENCOUNTER — Other Ambulatory Visit: Payer: Self-pay

## 2018-10-31 ENCOUNTER — Encounter: Payer: Self-pay | Admitting: Internal Medicine

## 2018-10-31 VITALS — BP 112/66 | HR 65 | Temp 98.1°F | Ht 63.5 in | Wt 134.0 lb

## 2018-10-31 DIAGNOSIS — E538 Deficiency of other specified B group vitamins: Secondary | ICD-10-CM

## 2018-10-31 DIAGNOSIS — Z79890 Hormone replacement therapy (postmenopausal): Secondary | ICD-10-CM | POA: Diagnosis not present

## 2018-10-31 DIAGNOSIS — N951 Menopausal and female climacteric states: Secondary | ICD-10-CM | POA: Diagnosis not present

## 2018-10-31 NOTE — Patient Instructions (Signed)
Menopause Menopause is the normal time of life when menstrual periods stop completely. It is usually confirmed by 12 months without a menstrual period. The transition to menopause (perimenopause) most often happens between the ages of 72 and 56. During perimenopause, hormone levels change in your body, which can cause symptoms and affect your health. Menopause may increase your risk for:  Loss of bone (osteoporosis), which causes bone breaks (fractures).  Depression.  Hardening and narrowing of the arteries (atherosclerosis), which can cause heart attacks and strokes. What are the causes? This condition is usually caused by a natural change in hormone levels that happens as you get older. The condition may also be caused by surgery to remove both ovaries (bilateral oophorectomy). What increases the risk? This condition is more likely to start at an earlier age if you have certain medical conditions or treatments, including:  A tumor of the pituitary gland in the brain.  A disease that affects the ovaries and hormone production.  Radiation treatment for cancer.  Certain cancer treatments, such as chemotherapy or hormone (anti-estrogen) therapy.  Heavy smoking and excessive alcohol use.  Family history of early menopause. This condition is also more likely to develop earlier in women who are very thin. What are the signs or symptoms? Symptoms of this condition include:  Hot flashes.  Irregular menstrual periods.  Night sweats.  Changes in feelings about sex. This could be a decrease in sex drive or an increased comfort around your sexuality.  Vaginal dryness and thinning of the vaginal walls. This may cause painful intercourse.  Dryness of the skin and development of wrinkles.  Headaches.  Problems sleeping (insomnia).  Mood swings or irritability.  Memory problems.  Weight gain.  Hair growth on the face and chest.  Bladder infections or problems with urinating. How  is this diagnosed? This condition is diagnosed based on your medical history, a physical exam, your age, your menstrual history, and your symptoms. Hormone tests may also be done. How is this treated? In some cases, no treatment is needed. You and your health care provider should make a decision together about whether treatment is necessary. Treatment will be based on your individual condition and preferences. Treatment for this condition focuses on managing symptoms. Treatment may include:  Menopausal hormone therapy (MHT).  Medicines to treat specific symptoms or complications.  Acupuncture.  Vitamin or herbal supplements. Before starting treatment, make sure to let your health care provider know if you have a personal or family history of:  Heart disease.  Breast cancer.  Blood clots.  Diabetes.  Osteoporosis. Follow these instructions at home: Lifestyle  Do not use any products that contain nicotine or tobacco, such as cigarettes and e-cigarettes. If you need help quitting, ask your health care provider.  Get at least 30 minutes of physical activity on 5 or more days each week.  Avoid alcoholic and caffeinated beverages, as well as spicy foods. This may help prevent hot flashes.  Get 7-8 hours of sleep each night.  If you have hot flashes, try: ? Dressing in layers. ? Avoiding things that may trigger hot flashes, such as spicy food, warm places, or stress. ? Taking slow, deep breaths when a hot flash starts. ? Keeping a fan in your home and office.  Find ways to manage stress, such as deep breathing, meditation, or journaling.  Consider going to group therapy with other women who are having menopause symptoms. Ask your health care provider about recommended group therapy meetings. Eating and  drinking  Eat a healthy, balanced diet that contains whole grains, lean protein, low-fat dairy, and plenty of fruits and vegetables.  Your health care provider may recommend  adding more soy to your diet. Foods that contain soy include tofu, tempeh, and soy milk.  Eat plenty of foods that contain calcium and vitamin D for bone health. Items that are rich in calcium include low-fat milk, yogurt, beans, almonds, sardines, broccoli, and kale. Medicines  Take over-the-counter and prescription medicines only as told by your health care provider.  Talk with your health care provider before starting any herbal supplements. If prescribed, take vitamins and supplements as told by your health care provider. These may include: ? Calcium. Women age 71 and older should get 1,200 mg (milligrams) of calcium every day. ? Vitamin D. Women need 600-800 International Units of vitamin D each day. ? Vitamins B12 and B6. Aim for 50 micrograms of B12 and 1.5 mg of B6 each day. General instructions  Keep track of your menstrual periods, including: ? When they occur. ? How heavy they are and how long they last. ? How much time passes between periods.  Keep track of your symptoms, noting when they start, how often you have them, and how long they last.  Use vaginal lubricants or moisturizers to help with vaginal dryness and improve comfort during sex.  Keep all follow-up visits as told by your health care provider. This is important. This includes any group therapy or counseling. Contact a health care provider if:  You are still having menstrual periods after age 53.  You have pain during sex.  You have not had a period for 12 months and you develop vaginal bleeding. Get help right away if:  You have: ? Severe depression. ? Excessive vaginal bleeding. ? Pain when you urinate. ? A fast or irregular heart beat (palpitations). ? Severe headaches. ? Abdomen (abdominal) pain or severe indigestion.  You fell and you think you have a broken bone.  You develop leg or chest pain.  You develop vision problems.  You feel a lump in your breast. Summary  Menopause is the normal  time of life when menstrual periods stop completely. It is usually confirmed by 12 months without a menstrual period.  The transition to menopause (perimenopause) most often happens between the ages of 25 and 40.  Symptoms can be managed through medicines, lifestyle changes, and complementary therapies such as acupuncture.  Eat a balanced diet that is rich in nutrients to promote bone health and heart health and to manage symptoms during menopause. This information is not intended to replace advice given to you by your health care provider. Make sure you discuss any questions you have with your health care provider. Document Released: 05/22/2003 Document Revised: 02/11/2017 Document Reviewed: 04/03/2016 Elsevier Patient Education  2020 [employer].

## 2018-11-01 LAB — DHEA-SULFATE: DHEA-SO4: 219 ug/dL — ABNORMAL HIGH (ref 20.4–186.6)

## 2018-11-01 LAB — ESTRADIOL: Estradiol: 10.4 pg/mL

## 2018-11-01 LAB — PROGESTERONE: Progesterone: 0.5 ng/mL

## 2018-11-01 LAB — TESTOSTERONE: Testosterone: 16 ng/dL (ref 3–41)

## 2018-11-08 ENCOUNTER — Encounter: Payer: Self-pay | Admitting: Internal Medicine

## 2018-11-08 ENCOUNTER — Ambulatory Visit (INDEPENDENT_AMBULATORY_CARE_PROVIDER_SITE_OTHER): Payer: Medicare PPO

## 2018-11-08 DIAGNOSIS — E538 Deficiency of other specified B group vitamins: Secondary | ICD-10-CM

## 2018-11-08 DIAGNOSIS — R300 Dysuria: Secondary | ICD-10-CM

## 2018-11-08 MED ORDER — CYANOCOBALAMIN 1000 MCG/ML IJ SOLN
1000.0000 ug | Freq: Once | INTRAMUSCULAR | Status: AC
  Administered 2018-11-08: 1000 ug via INTRAMUSCULAR

## 2018-11-08 NOTE — Progress Notes (Signed)
Medical screening examination/treatment/procedure(s) were performed by non-physician practitioner and as supervising physician I was immediately available for consultation/collaboration. I agree with above. Cathlean Cower, MD

## 2018-11-09 ENCOUNTER — Other Ambulatory Visit (INDEPENDENT_AMBULATORY_CARE_PROVIDER_SITE_OTHER): Payer: Medicare PPO

## 2018-11-09 DIAGNOSIS — R300 Dysuria: Secondary | ICD-10-CM

## 2018-11-09 LAB — URINALYSIS, ROUTINE W REFLEX MICROSCOPIC
Bilirubin Urine: NEGATIVE
Ketones, ur: NEGATIVE
Leukocytes,Ua: NEGATIVE
Nitrite: POSITIVE — AB
Specific Gravity, Urine: 1.025 (ref 1.000–1.030)
Total Protein, Urine: NEGATIVE
Urine Glucose: NEGATIVE
Urobilinogen, UA: 0.2 (ref 0.0–1.0)
pH: 5.5 (ref 5.0–8.0)

## 2018-11-10 ENCOUNTER — Telehealth: Payer: Self-pay | Admitting: Internal Medicine

## 2018-11-10 MED ORDER — LEVOFLOXACIN 500 MG PO TABS: 500.0000 mg | ORAL_TABLET | Freq: Every day | ORAL | 0 refills | Status: AC

## 2018-11-10 NOTE — Telephone Encounter (Signed)
Called pt, LVM.

## 2018-11-10 NOTE — Telephone Encounter (Signed)
Patient called back.  I informed patient of Dr. Gwynn Burly message.  She stated understanding.

## 2018-11-10 NOTE — Telephone Encounter (Signed)
Ok to let pt know, the UA appaers to be possibly infection restarting -   levaquin done erx asd  - ok to start, culture is still pending but should return this weekend

## 2018-11-10 NOTE — Telephone Encounter (Signed)
Noted  

## 2018-11-11 LAB — URINE CULTURE
MICRO NUMBER:: 818925
SPECIMEN QUALITY:: ADEQUATE

## 2018-11-13 NOTE — Progress Notes (Signed)
Subjective:     Patient ID: Donna Holmes , female    DOB: [birth year] , 68 y.o.   MRN: [id number]   Chief Complaint  Patient presents with  . Hormone f/u    HPI  She is here today for f/u BHRT. She feels well on her current regimen. She does not wish to totally wean off of BHRT.  She denies hot flashes, vaginal dryness and impaired cognition. She does need refill on her meds.     Past Medical History:  Diagnosis Date  . ALLERGIC RHINITIS 05/19/2009  . Anemia, iron deficiency 06/09/2015  . B12 deficiency 08/02/2018  . 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 Complication of anesthesia   . GERD (gastroesophageal reflux disease)   . Headache(784.0) 05/19/2009     Family History  Problem Relation Age of Onset  . Alcohol abuse Father   . Hypertension Mother   . Prostate cancer Maternal Uncle      Current Outpatient Medications:  .  acetaminophen (TYLENOL) 500 MG tablet, Take 500 mg by mouth every 6 (six) hours as needed., Disp: , Rfl:  .  BIOTIN PO, Take 500 mg by mouth daily. Reported on 04/30/2015, Disp: , Rfl:  .  calcium carbonate (OS-CAL) 600 MG TABS tablet, Take 600 mg by mouth daily., Disp: , Rfl:  .  cetirizine-pseudoephedrine (ZYRTEC-D) 5-120 MG per tablet, Take 1 tablet by mouth once as needed for allergies., Disp: , Rfl:  .  Cholecalciferol (VITAMIN D-3 PO), Take 4,000 Units by mouth daily., Disp: , Rfl:  .  Estradiol-Estriol-Progesterone (BIEST/PROGESTERONE) CREA, Place onto the skin 2 (two) times daily., Disp: , Rfl:  .  Glucosamine-Chondroitin (GLUCOSAMINE CHONDR COMPLEX PO), Take 1 tablet by mouth daily., Disp: , Rfl:  .  Nutritional Supplements (DHEA PO), Take 10 mg by mouth 3 (three) times a week. , Disp: , Rfl:  .  Omega-3 Fatty Acids (FISH OIL) 1000 MG CAPS, Take 1,000 mg by mouth daily., Disp: , Rfl:  .  pantoprazole (PROTONIX) 40 MG tablet, TAKE 1 TABLET BY MOUTH EVERY DAY, Disp: 90 tablet, Rfl: 2 .  levofloxacin (LEVAQUIN) 500 MG tablet, Take 1 tablet (500 mg total) by  mouth daily for 10 days., Disp: 10 tablet, Rfl: 0   Allergies  Allergen Reactions  . Codeine Nausea And Vomiting  . Oxycontin [Oxycodone Hcl] Nausea Only and Other (See Comments)    FELT LIKE I WAS GOING TO FAINT  . Statins Other (See Comments)    myalgias     Review of Systems  Constitutional: Negative.   Respiratory: Negative.   Cardiovascular: Negative.   Gastrointestinal: Negative.   Neurological: Negative.   Psychiatric/Behavioral: Negative.      Today's Vitals   10/31/18 1110  BP: 112/66  Pulse: 65  Temp: 98.1 F (36.7 C)  TempSrc: Oral  Weight: 134 lb (60.8 kg)  Height: 5' 3.5" (1.613 m)  PainSc: 0-No pain   Body mass index is 23.36 kg/m.   Objective:  Physical Exam Vitals signs and nursing note reviewed.  Constitutional:      Appearance: Normal appearance.  HENT:     Head: Normocephalic and atraumatic.  Cardiovascular:     Rate and Rhythm: Normal rate and regular rhythm.     Heart sounds: Normal heart sounds.  Pulmonary:     Effort: Pulmonary effort is normal.     Breath sounds: Normal breath sounds.  Skin:    General: Skin is warm.  Neurological:     General: No focal deficit  present.     Mental Status: She is alert.  Psychiatric:        Mood and Affect: Mood normal.        Behavior: Behavior normal.         Assessment And Plan:     1. 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 She does not wish to discontinue BHRT therapy.  She reports she has had huge improvements in how she feels on a daily basis. She does agree to dose adjustments as needed.   2. B12 deficiency   Treatment  as per her PCP.    3. Need for postmenopausal hormone replacement  She no longer wishes to perform saliva testing. She agrees to checking serum levels of her hormones.  She will rto in four months for re-evaluation.   - Estradiol - Testosterone, Total - DHEA-sulfate - Progesterone        Maximino Greenland, MD    THE PATIENT IS  ENCOURAGED TO PRACTICE SOCIAL DISTANCING DUE TO THE COVID-19 PANDEMIC.

## 2018-12-13 ENCOUNTER — Ambulatory Visit (INDEPENDENT_AMBULATORY_CARE_PROVIDER_SITE_OTHER): Payer: Medicare PPO

## 2018-12-13 DIAGNOSIS — Z23 Encounter for immunization: Secondary | ICD-10-CM

## 2018-12-13 DIAGNOSIS — E538 Deficiency of other specified B group vitamins: Secondary | ICD-10-CM | POA: Diagnosis not present

## 2018-12-13 MED ORDER — CYANOCOBALAMIN 1000 MCG/ML IJ SOLN
1000.0000 ug | Freq: Once | INTRAMUSCULAR | Status: AC
  Administered 2018-12-13: 16:00:00 1000 ug via INTRAMUSCULAR

## 2018-12-19 NOTE — Progress Notes (Signed)
Medical screening examination/treatment/procedure(s) were performed by non-physician practitioner and as supervising physician I was immediately available for consultation/collaboration. I agree with above. Cathlean Cower, MD

## 2018-12-27 ENCOUNTER — Other Ambulatory Visit: Payer: Self-pay | Admitting: Obstetrics and Gynecology

## 2018-12-27 DIAGNOSIS — Z1231 Encounter for screening mammogram for malignant neoplasm of breast: Secondary | ICD-10-CM

## 2019-01-12 ENCOUNTER — Ambulatory Visit (INDEPENDENT_AMBULATORY_CARE_PROVIDER_SITE_OTHER): Payer: Medicare PPO | Admitting: *Deleted

## 2019-01-12 ENCOUNTER — Other Ambulatory Visit: Payer: Self-pay

## 2019-01-12 DIAGNOSIS — E538 Deficiency of other specified B group vitamins: Secondary | ICD-10-CM

## 2019-01-12 MED ORDER — CYANOCOBALAMIN 1000 MCG/ML IJ SOLN
1000.0000 ug | Freq: Once | INTRAMUSCULAR | Status: AC
  Administered 2019-01-12: 15:00:00 1000 ug via INTRAMUSCULAR

## 2019-01-16 NOTE — Progress Notes (Signed)
Medical screening examination/treatment/procedure(s) were performed by non-physician practitioner and as supervising physician I was immediately available for consultation/collaboration. I agree with above. James John, MD   

## 2019-01-25 ENCOUNTER — Other Ambulatory Visit: Payer: Self-pay | Admitting: Internal Medicine

## 2019-02-12 ENCOUNTER — Other Ambulatory Visit: Payer: Self-pay

## 2019-02-12 ENCOUNTER — Ambulatory Visit (INDEPENDENT_AMBULATORY_CARE_PROVIDER_SITE_OTHER): Payer: Medicare PPO

## 2019-02-12 DIAGNOSIS — E538 Deficiency of other specified B group vitamins: Secondary | ICD-10-CM

## 2019-02-12 MED ORDER — CYANOCOBALAMIN 1000 MCG/ML IJ SOLN
1000.0000 ug | Freq: Once | INTRAMUSCULAR | Status: AC
  Administered 2019-02-12: 1000 ug via INTRAMUSCULAR

## 2019-02-12 NOTE — Progress Notes (Signed)
.  Medical screening examination/treatment/procedure(s) were performed by non-physician practitioner and as supervising physician I was immediately available for consultation/collaboration. I agree with above. Christia Coaxum, MD   

## 2019-02-14 ENCOUNTER — Ambulatory Visit
Admission: RE | Admit: 2019-02-14 | Discharge: 2019-02-14 | Disposition: A | Discharge status: Home / Self Care | Payer: Medicare PPO | Source: Ambulatory Visit | Attending: Obstetrics and Gynecology | Admitting: Obstetrics and Gynecology

## 2019-02-14 ENCOUNTER — Other Ambulatory Visit: Payer: Self-pay

## 2019-02-14 DIAGNOSIS — Z1231 Encounter for screening mammogram for malignant neoplasm of breast: Secondary | ICD-10-CM | POA: Diagnosis not present

## 2019-03-05 ENCOUNTER — Encounter: Payer: Self-pay | Admitting: Internal Medicine

## 2019-03-07 ENCOUNTER — Other Ambulatory Visit: Payer: Self-pay | Admitting: Internal Medicine

## 2019-03-19 ENCOUNTER — Ambulatory Visit (INDEPENDENT_AMBULATORY_CARE_PROVIDER_SITE_OTHER): Payer: Medicare PPO | Admitting: Internal Medicine

## 2019-03-19 ENCOUNTER — Other Ambulatory Visit: Payer: Self-pay

## 2019-03-19 ENCOUNTER — Encounter: Payer: Self-pay | Admitting: Internal Medicine

## 2019-03-19 VITALS — BP 120/82 | HR 72 | Temp 98.6°F | Resp 12 | Ht 63.0 in | Wt 134.0 lb

## 2019-03-19 DIAGNOSIS — E559 Vitamin D deficiency, unspecified: Secondary | ICD-10-CM | POA: Diagnosis not present

## 2019-03-19 DIAGNOSIS — R8290 Unspecified abnormal findings in urine: Secondary | ICD-10-CM

## 2019-03-19 DIAGNOSIS — R739 Hyperglycemia, unspecified: Secondary | ICD-10-CM | POA: Diagnosis not present

## 2019-03-19 DIAGNOSIS — Z0000 Encounter for general adult medical examination without abnormal findings: Secondary | ICD-10-CM | POA: Diagnosis not present

## 2019-03-19 DIAGNOSIS — D509 Iron deficiency anemia, unspecified: Secondary | ICD-10-CM | POA: Diagnosis not present

## 2019-03-19 DIAGNOSIS — Z0001 Encounter for general adult medical examination with abnormal findings: Secondary | ICD-10-CM

## 2019-03-19 DIAGNOSIS — E538 Deficiency of other specified B group vitamins: Secondary | ICD-10-CM | POA: Diagnosis not present

## 2019-03-19 LAB — URINALYSIS, ROUTINE W REFLEX MICROSCOPIC
Bilirubin Urine: NEGATIVE
Ketones, ur: NEGATIVE
Leukocytes,Ua: NEGATIVE
Nitrite: NEGATIVE
RBC / HPF: NONE SEEN (ref 0–?)
Specific Gravity, Urine: 1.025 (ref 1.000–1.030)
Total Protein, Urine: NEGATIVE
Urine Glucose: NEGATIVE
Urobilinogen, UA: 0.2 (ref 0.0–1.0)
pH: 6 (ref 5.0–8.0)

## 2019-03-19 LAB — HEPATIC FUNCTION PANEL
ALT: 25 U/L (ref 0–35)
AST: 20 U/L (ref 0–37)
Albumin: 4.7 g/dL (ref 3.5–5.2)
Alkaline Phosphatase: 70 U/L (ref 39–117)
Bilirubin, Direct: 0.1 mg/dL (ref 0.0–0.3)
Total Bilirubin: 0.4 mg/dL (ref 0.2–1.2)
Total Protein: 7.8 g/dL (ref 6.0–8.3)

## 2019-03-19 LAB — BASIC METABOLIC PANEL
BUN: 19 mg/dL (ref 6–23)
CO2: 29 mEq/L (ref 19–32)
Calcium: 10.2 mg/dL (ref 8.4–10.5)
Chloride: 102 mEq/L (ref 96–112)
Creatinine, Ser: 0.77 mg/dL (ref 0.40–1.20)
GFR: 73.9 mL/min (ref >60.00)
Glucose, Bld: 97 mg/dL (ref 70–99)
Potassium: 4 mEq/L (ref 3.5–5.1)
Sodium: 140 mEq/L (ref 135–145)

## 2019-03-19 LAB — IBC PANEL
Iron: 51 ug/dL (ref 42–145)
Saturation Ratios: 11.8 % — ABNORMAL LOW (ref 20.0–50.0)
Transferrin: 308 mg/dL (ref 212.0–360.0)

## 2019-03-19 LAB — CBC WITH DIFFERENTIAL/PLATELET
Basophils Absolute: 0.1 10*3/uL (ref 0.0–0.1)
Basophils Relative: 1.1 % (ref 0.0–3.0)
Eosinophils Absolute: 0.2 10*3/uL (ref 0.0–0.7)
Eosinophils Relative: 2.3 % (ref 0.0–5.0)
HCT: 38.9 % (ref 36.0–46.0)
Hemoglobin: 12.8 g/dL (ref 12.0–15.0)
Lymphocytes Relative: 33.2 % (ref 12.0–46.0)
Lymphs Abs: 3.2 10*3/uL (ref 0.7–4.0)
MCHC: 33 g/dL (ref 30.0–36.0)
MCV: 86.7 fl (ref 78.0–100.0)
Monocytes Absolute: 0.6 10*3/uL (ref 0.1–1.0)
Monocytes Relative: 5.9 % (ref 3.0–12.0)
Neutro Abs: 5.6 10*3/uL (ref 1.4–7.7)
Neutrophils Relative %: 57.5 % (ref 43.0–77.0)
Platelets: 305 10*3/uL (ref 150.0–400.0)
RBC: 4.48 Mil/uL (ref 3.87–5.11)
RDW: 13.9 % (ref 11.5–15.5)
WBC: 9.8 10*3/uL (ref 4.0–10.5)

## 2019-03-19 LAB — LIPID PANEL
Cholesterol: 245 mg/dL — ABNORMAL HIGH (ref 0–200)
HDL: 46.9 mg/dL (ref >39.00)
NonHDL: 198.22
Total CHOL/HDL Ratio: 5
Triglycerides: 267 mg/dL — ABNORMAL HIGH (ref 0.0–149.0)
VLDL: 53.4 mg/dL — ABNORMAL HIGH (ref 0.0–40.0)

## 2019-03-19 LAB — VITAMIN B12: Vitamin B-12: >1500 pg/mL — ABNORMAL HIGH (ref 211–911)

## 2019-03-19 LAB — TSH: TSH: 1.15 u[IU]/mL (ref 0.35–4.50)

## 2019-03-19 LAB — HEMOGLOBIN A1C: Hgb A1c MFr Bld: 6.3 % (ref 4.6–6.5)

## 2019-03-19 LAB — VITAMIN D 25 HYDROXY (VIT D DEFICIENCY, FRACTURES): VITD: 73.58 ng/mL (ref 30.00–100.00)

## 2019-03-19 NOTE — Assessment & Plan Note (Signed)
[place] for f/u lab, cont oral replacement

## 2019-03-19 NOTE — Assessment & Plan Note (Signed)

## 2019-03-19 NOTE — Assessment & Plan Note (Signed)
stable overall by history and exam, recent data reviewed with pt, and pt to continue medical treatment as before,  to f/u any worsening symptoms or concerns  

## 2019-03-19 NOTE — Assessment & Plan Note (Addendum)
?  uTI - for urine studies,  to f/u any worsening symptoms or concerns  In addition to the time spent performing CPE, I spent an additional 15 minutes face to face,in which greater than 50% of this time was spent in counseling and coordination of care for patient's illness as documented, including the differential dx, treatment, further evaluation and other management of abnormal urine odor, hyperglycemia, b12 and vit D deficiencies

## 2019-03-19 NOTE — Patient Instructions (Addendum)
OK to hold off on further b12 shots  Please continue all other medications as before, and refills have been done if requested.  Please have the pharmacy call with any other refills you may need.  Please continue your efforts at being more active, low cholesterol diet, and weight control.  You are otherwise up to date with prevention measures today.  Please keep your appointments with your specialists as you may have planned  Please go to the LAB at the blood drawing area for the tests to be done  You will be contacted by phone if any changes need to be made immediately.  Otherwise, you will receive a letter about your results with an explanation, but please check with MyChart first.  Please remember to sign up for MyChart if you have not done so, as this will be important to you in the future with finding out test results, communicating by private email, and scheduling acute appointments online when needed.

## 2019-03-19 NOTE — Assessment & Plan Note (Signed)
No overt bleeding, also for iron lab f/u

## 2019-03-19 NOTE — Progress Notes (Signed)
Subjective:    Patient ID: Donna Holmes, female    DOB: 06/11/1950, 69 y.o.   MRN: [id number]  HPI  Here for wellness and f/u;  Overall doing ok;  Pt denies Chest pain, worsening SOB, DOE, wheezing, orthopnea, PND, worsening LE edema, palpitations, dizziness or syncope.  Pt denies neurological change such as new headache, facial or extremity weakness.  Pt denies polydipsia, polyuria, or low sugar symptoms. Pt states overall good compliance with treatment and medications, good tolerability, and has been trying to follow appropriate diet.  Pt denies worsening depressive symptoms, suicidal ideation or panic. No fever, night sweats, wt loss, loss of appetite, or other constitutional symptoms.  Pt states good ability with ADL's, has low fall risk, home safety reviewed and adequate, no other significant changes in hearing or vision, and only occasionally active with exercise. Also had abnormal urine smell for 1 wk, mild to mod, intermittent, nothing makes better or worse, and Denies urinary symptoms such as dysuria, frequency, urgency, flank pain, hematuria or n/v, fever, chills.  Due for B12 lab f/u.  Also Pt continues to have recurring LBP without change in severity, bowel or bladder change, fever, wt loss,  worsening LE pain/numbness/weakness, gait change or falls. Past Medical History:  Diagnosis Date  . ALLERGIC RHINITIS 05/19/2009  . Anemia, iron deficiency 06/09/2015  . B12 deficiency 08/02/2018  . 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 Complication of anesthesia   . GERD (gastroesophageal reflux disease)   . Headache(784.0) 05/19/2009   Past Surgical History:  Procedure Laterality Date  . ANTERIOR AND POSTERIOR REPAIR WITH SACROSPINOUS FIXATION N/A 05/16/2014   Procedure: ANTERIOR AND POSTERIOR REPAIR WITH SACROSPINOUS LIGAMENT SUSPENSION;  Surgeon: Luz Lex, MD;  Location: [facility];  Service: Gynecology;  Laterality: N/A;  . BARTHOLIN CYST MARSUPIALIZATION    . BUNIONECTOMY Right   . LAPAROSCOPIC  ASSISTED VAGINAL HYSTERECTOMY N/A 05/16/2014   Procedure: LAPAROSCOPIC ASSISTED VAGINAL HYSTERECTOMY;  Surgeon: Luz Lex, MD;  Location: [facility];  Service: Gynecology;  Laterality: N/A;  . NEUROMA SURGERY Right    2nd interspace    reports that she has never smoked. She has never used smokeless tobacco. She reports that she does not drink alcohol or use drugs. family history includes Alcohol abuse in her father; Hypertension in her mother; Prostate cancer in her maternal uncle. Allergies  Allergen Reactions  . Codeine Nausea And Vomiting  . Oxycontin [Oxycodone Hcl] Nausea Only and Other (See Comments)    FELT LIKE I WAS GOING TO FAINT  . Statins Other (See Comments)    myalgias   Current Outpatient Medications on File Prior to Visit  Medication Sig Dispense Refill  . acetaminophen (TYLENOL) 500 MG tablet Take 500 mg by mouth every 6 (six) hours as needed.    Marland Kitchen BIOTIN PO Take 500 mg by mouth daily. Reported on 04/30/2015    . calcium carbonate (OS-CAL) 600 MG TABS tablet Take 600 mg by mouth daily.    . cetirizine-pseudoephedrine (ZYRTEC-D) 5-120 MG per tablet Take 1 tablet by mouth once as needed for allergies.    . Cholecalciferol (VITAMIN D-3 PO) Take 4,000 Units by mouth daily.    . Estradiol-Estriol-Progesterone (BIEST/PROGESTERONE) CREA Apply 2 clicks daily, except on Sunday. 40 g PRN  . Glucosamine-Chondroitin (GLUCOSAMINE CHONDR COMPLEX PO) Take 1 tablet by mouth daily.    . Omega-3 Fatty Acids (FISH OIL) 1000 MG CAPS Take 1,000 mg by mouth daily.    . pantoprazole (PROTONIX) 40 MG tablet TAKE 1  TABLET BY MOUTH EVERY DAY 90 tablet 1  . Nutritional Supplements (DHEA PO) Take 10 mg by mouth 3 (three) times a week.      No current facility-administered medications on file prior to visit.   Review of Systems Constitutional: Negative for other unusual diaphoresis, sweats, appetite or weight changes HENT: Negative for other worsening hearing loss, ear pain, facial swelling, mouth  sores or neck stiffness.   Eyes: Negative for other worsening pain, redness or other visual disturbance.  Respiratory: Negative for other stridor or swelling Cardiovascular: Negative for other palpitations or other chest pain  Gastrointestinal: Negative for worsening diarrhea or loose stools, blood in stool, distention or other pain Genitourinary: Negative for hematuria, flank pain or other change in urine volume.  Musculoskeletal: Negative for myalgias or other joint swelling.  Skin: Negative for other color change, or other wound or worsening drainage.  Neurological: Negative for other syncope or numbness. Hematological: Negative for other adenopathy or swelling Psychiatric/Behavioral: Negative for hallucinations, other worsening agitation, SI, self-injury, or new decreased concentration.allj All otherwise neg per pt     Objective:   Physical Exam BP 120/82   Pulse 72   Temp 98.6 F (37 C)   Resp 12   Ht _0  (1.6 m)   Wt 134 lb (60.8 kg)   SpO2 98%   BMI 23.74 kg/m  VS noted,  Constitutional: Pt is oriented to person, place, and time. Appears well-developed and well-nourished, in no significant distress and comfortable Head: Normocephalic and atraumatic  Eyes: Conjunctivae and EOM are normal. Pupils are equal, round, and reactive to light Right Ear: External ear normal without discharge Left Ear: External ear normal without discharge Nose: Nose without discharge or deformity Mouth/Throat: Oropharynx is without other ulcerations and moist  Neck: Normal range of motion. Neck supple. No JVD present. No tracheal deviation present or significant neck LA or mass Cardiovascular: Normal rate, regular rhythm, normal heart sounds and intact distal pulses.   Pulmonary/Chest: WOB normal and breath sounds without rales or wheezing  Abdominal: Soft. Bowel sounds are normal. NT. No HSM  Musculoskeletal: Normal range of motion. Exhibits no edema Lymphadenopathy: Has no other cervical  adenopathy.  Neurological: Pt is alert and oriented to person, place, and time. Pt has normal reflexes. No cranial nerve deficit. Motor grossly intact, Gait intact Skin: Skin is warm and dry. No rash noted or new ulcerations Psychiatric:  Has normal mood and affect. Behavior is normal without agitation] All otherwise neg per pt Lab Results  Component Value Date   WBC 6.8 08/01/2018   HGB 13.0 08/01/2018   HCT 38.5 08/01/2018   PLT 278.0 08/01/2018   GLUCOSE 92 08/01/2018   CHOL 196 08/01/2018   TRIG 160.0 (H) 08/01/2018   HDL 40.60 08/01/2018   LDLDIRECT 147.0 07/19/2016   LDLCALC 124 (H) 08/01/2018   ALT 28 08/01/2018   AST 24 08/01/2018   NA 139 08/01/2018   K 4.4 08/01/2018   CL 103 08/01/2018   CREATININE 0.80 08/01/2018   BUN 22 08/01/2018   CO2 29 08/01/2018   TSH 1.48 08/01/2018   HGBA1C 6.2 08/01/2018      Assessment & Plan:

## 2019-03-20 LAB — LDL CHOLESTEROL, DIRECT: Direct LDL: 167 mg/dL

## 2019-03-21 ENCOUNTER — Other Ambulatory Visit: Payer: Self-pay | Admitting: Internal Medicine

## 2019-03-21 LAB — URINE CULTURE

## 2019-03-21 MED ORDER — CEPHALEXIN 500 MG PO CAPS: 500.0000 mg | ORAL_CAPSULE | Freq: Three times a day (TID) | ORAL | 0 refills | Status: AC

## 2019-03-30 ENCOUNTER — Encounter: Payer: Self-pay | Admitting: Internal Medicine

## 2019-04-22 ENCOUNTER — Ambulatory Visit: Payer: Medicare PPO | Attending: Internal Medicine

## 2019-04-22 DIAGNOSIS — Z23 Encounter for immunization: Secondary | ICD-10-CM | POA: Insufficient documentation

## 2019-04-22 NOTE — Progress Notes (Signed)
   Covid-19 Vaccination Clinic  Name:  Donna Holmes    MRN: [id number] DOB: [birth year]  04/22/2019  Donna Holmes was observed post Covid-19 immunization for 15 minutes without incidence. She was provided with Vaccine Information Sheet and instruction to access the V-Safe system.   Donna Holmes was instructed to call 911 with any severe reactions post vaccine: Marland Kitchen Difficulty breathing  . Swelling of your face and throat  . A fast heartbeat  . A bad rash all over your body  . Dizziness and weakness    Immunizations Administered    Name Date Dose VIS Date Route   Pfizer COVID-19 Vaccine 04/22/2019  5:31 PM 0.3 mL 02/23/2019 Intramuscular   Manufacturer: [place]   Lot: [id number]   [place]: [id number]

## 2019-05-03 ENCOUNTER — Encounter: Payer: Self-pay | Admitting: Internal Medicine

## 2019-05-03 ENCOUNTER — Other Ambulatory Visit: Payer: Self-pay

## 2019-05-03 ENCOUNTER — Telehealth (INDEPENDENT_AMBULATORY_CARE_PROVIDER_SITE_OTHER): Payer: Medicare PPO | Admitting: Internal Medicine

## 2019-05-03 ENCOUNTER — Ambulatory Visit: Payer: Medicare PPO | Admitting: Internal Medicine

## 2019-05-03 VITALS — BP 118/79 | HR 82 | Ht 63.0 in

## 2019-05-03 DIAGNOSIS — Z79890 Hormone replacement therapy (postmenopausal): Secondary | ICD-10-CM

## 2019-05-03 DIAGNOSIS — R7309 Other abnormal glucose: Secondary | ICD-10-CM

## 2019-05-03 DIAGNOSIS — R519 Headache, unspecified: Secondary | ICD-10-CM | POA: Diagnosis not present

## 2019-05-03 DIAGNOSIS — N951 Menopausal and female climacteric states: Secondary | ICD-10-CM

## 2019-05-03 NOTE — Progress Notes (Signed)
Virtual Visit via Video   This visit type was conducted due to national recommendations for restrictions regarding the COVID-19 Pandemic (e.g. social distancing) in an effort to limit this patient's exposure and mitigate transmission in our community.  Due to her co-morbid illnesses, this patient is at least at moderate risk for complications without adequate follow up.  This format is felt to be most appropriate for this patient at this time.  All issues noted in this document were discussed and addressed.  A limited physical exam was performed with this format.    This visit type was conducted due to national recommendations for restrictions regarding the COVID-19 Pandemic (e.g. social distancing) in an effort to limit this patient's exposure and mitigate transmission in our community.  Patients identity confirmed using two different identifiers.  This format is felt to be most appropriate for this patient at this time.  All issues noted in this document were discussed and addressed.  No physical exam was performed (except for noted visual exam findings with Video Visits).    Date:  05/06/2019   ID:  Donna Holmes, DOB Jan 18, 1951, MRN [id number]  Patient Location:  Home  Provider location:   Office    Chief Complaint:  "I have BHRT f/u"  History of Present Illness:    Donna Holmes is a 69 y.o. female who presents via video conferencing for a telehealth visit today.    The patient does not have symptoms concerning for COVID-19 infection (fever, chills, cough, or new shortness of breath).   She presents today for virtual visit. She prefers this method of contact due to COVID-19 pandemic.  She presents for f/u BHRT.  She reports compliance with her BHRT regimen. She is still using Biest/progesterone/testosterone/DHEA. She has not had any side effects from her regimen. She denies hot flashes, night sweats and unwanted hair growth.    She does admit to waking up with sinus  headache today. She has yet to take her allergy meds.     Past Medical History:  Diagnosis Date  . ALLERGIC RHINITIS 05/19/2009  . Anemia, iron deficiency 06/09/2015  . B12 deficiency 08/02/2018  . 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 Complication of anesthesia   . GERD (gastroesophageal reflux disease)   . Headache(784.0) 05/19/2009   Past Surgical History:  Procedure Laterality Date  . ANTERIOR AND POSTERIOR REPAIR WITH SACROSPINOUS FIXATION N/A 05/16/2014   Procedure: ANTERIOR AND POSTERIOR REPAIR WITH SACROSPINOUS LIGAMENT SUSPENSION;  Surgeon: Luz Lex, MD;  Location: [facility];  Service: Gynecology;  Laterality: N/A;  . BARTHOLIN CYST MARSUPIALIZATION    . BUNIONECTOMY Right   . LAPAROSCOPIC ASSISTED VAGINAL HYSTERECTOMY N/A 05/16/2014   Procedure: LAPAROSCOPIC ASSISTED VAGINAL HYSTERECTOMY;  Surgeon: Luz Lex, MD;  Location: [facility];  Service: Gynecology;  Laterality: N/A;  . NEUROMA SURGERY Right    2nd interspace     Current Meds  Medication Sig  . acetaminophen (TYLENOL) 500 MG tablet Take 500 mg by mouth every 6 (six) hours as needed.  . calcium carbonate (OS-CAL) 600 MG TABS tablet Take 600 mg by mouth daily.  . cetirizine-pseudoephedrine (ZYRTEC-D) 5-120 MG per tablet Take 1 tablet by mouth once as needed for allergies.  . Cholecalciferol (VITAMIN D-3 PO) Take 4,000 Units by mouth daily.  . Estradiol-Estriol-Progesterone (BIEST/PROGESTERONE) CREA Apply 2 clicks daily, except on Sunday.  . Glucosamine-Chondroitin (GLUCOSAMINE CHONDR COMPLEX PO) Take 1 tablet by mouth daily.  . Omega-3 Fatty Acids (FISH OIL) 1000 MG CAPS Take 1,000  mg by mouth daily.  . pantoprazole (PROTONIX) 40 MG tablet TAKE 1 TABLET BY MOUTH EVERY DAY     Allergies:   Codeine, Oxycontin [oxycodone hcl], and Statins   Social History   Tobacco Use  . Smoking status: Never Smoker  . Smokeless tobacco: Never Used  Substance Use Topics  . Alcohol use: No  . Drug use: No     Family Hx: The patient's  family history includes Alcohol abuse in her father; Hypertension in her mother; Prostate cancer in her maternal uncle.  ROS:   Please see the history of present illness.    Review of Systems  Constitutional: Negative.   Respiratory: Negative.   Cardiovascular: Negative.   Gastrointestinal: Negative.   Neurological: Positive for headaches.       She reports she woke up with a sinus headache this am. She has yet to take her Zyrtec. She reports this usually helps with her symptoms.   Psychiatric/Behavioral: Negative.     All other systems reviewed and are negative.   Labs/Other Tests and Data Reviewed:    Recent Labs: 03/19/2019: ALT 25; BUN 19; Creatinine, Ser 0.77; Hemoglobin 12.8; Platelets 305.0; Potassium 4.0; Sodium 140; TSH 1.15   Recent Lipid Panel Lab Results  Component Value Date/Time   CHOL 245 (H) 03/19/2019 04:20 PM   TRIG 267.0 (H) 03/19/2019 04:20 PM   HDL 46.90 03/19/2019 04:20 PM   CHOLHDL 5 03/19/2019 04:20 PM   LDLCALC 124 (H) 08/01/2018 08:18 AM   LDLDIRECT 167.0 03/19/2019 04:20 PM    Wt Readings from Last 3 Encounters:  03/19/19 134 lb (60.8 kg)  10/31/18 134 lb (60.8 kg)  07/31/18 133 lb (60.3 kg)     Exam:    Vital Signs:  BP 118/79 Comment: pt provided  Pulse 82   Ht _0  (1.6 m)   BMI 23.74 kg/m     Physical Exam  Constitutional: She is oriented to person, place, and time and well-developed, well-nourished, and in no distress.  HENT:  Head: Normocephalic and atraumatic.  Pulmonary/Chest: Effort normal.  Musculoskeletal:     Cervical back: Normal range of motion.  Neurological: She is alert and oriented to person, place, and time.  Psychiatric: Affect normal.  Nursing note and vitals reviewed.   ASSESSMENT & PLAN:     1. 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 She has noticed improvement in her overall well-being since being on BHRT.  She does not wish to wean off of BHRT completely. She is okay with slowly  titrating the dose over time. She will continue with current meds at this time. I plan to decrease Bi-est at her next visit. She is in agreement with this. She agrees to come in next week for labwork. I will check estradiol, DHEA-S and testosterone levels.    1. 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 She agrees to come in to office next week for labwork. I will check serum levels of her hormones at that time. I do plan to decrease her Biest at her next visit. I will call in refills of DHEA and Biest. She will rto in six months for re-evaluation.   2. Sinus headache  She plans to take Zyrtec shortly after this visit. She may need nasal spray if her sx persist.   3. Other abnormal glucose  Her a1c from Jan 2021 was 6.3. She was encouraged to incorporate more exercise into her daily routine and  to avoid sugary beverages - including sodas, juices and sweet tea.   4. Need for postmenopausal hormone replacement  She agrees to come in next week for labwork. I also have reviewed her labs drawn by her PCP in Jan 2021.   - Estradiol; Future - Testosterone, Total; Future - DHEA-Sulfate, Serum; Future - Progesterone; Future   COVID-19 Education: The signs and symptoms of COVID-19 were discussed with the patient and how to seek care for testing (follow up with PCP or arrange E-visit).  The importance of social distancing was discussed today.  Patient Risk:   After full review of this patients clinical status, I feel that they are at least moderate risk at this time.    Medication Adjustments/Labs and Tests Ordered: Current medicines are reviewed at length with the patient today.  Concerns regarding medicines are outlined above.   Tests Ordered: Orders Placed This Encounter  Procedures  . Estradiol  . Testosterone, Total  . DHEA-Sulfate, Serum  . Progesterone    Medication Changes: No orders of the defined types were placed in this  encounter.   Disposition:  Follow up in 6 month(s)  Signed, Maximino Greenland, MD

## 2019-05-06 NOTE — Patient Instructions (Signed)

## 2019-05-09 ENCOUNTER — Other Ambulatory Visit: Payer: Medicare PPO

## 2019-05-09 ENCOUNTER — Other Ambulatory Visit: Payer: Self-pay

## 2019-05-09 DIAGNOSIS — Z79890 Hormone replacement therapy (postmenopausal): Secondary | ICD-10-CM | POA: Diagnosis not present

## 2019-05-11 ENCOUNTER — Ambulatory Visit: Payer: Medicare PPO

## 2019-05-15 DIAGNOSIS — Z823 Family history of stroke: Secondary | ICD-10-CM | POA: Diagnosis not present

## 2019-05-15 DIAGNOSIS — Z886 Allergy status to analgesic agent status: Secondary | ICD-10-CM | POA: Diagnosis not present

## 2019-05-15 DIAGNOSIS — Z885 Allergy status to narcotic agent status: Secondary | ICD-10-CM | POA: Diagnosis not present

## 2019-05-15 DIAGNOSIS — K219 Gastro-esophageal reflux disease without esophagitis: Secondary | ICD-10-CM | POA: Diagnosis not present

## 2019-05-17 ENCOUNTER — Ambulatory Visit: Payer: Medicare PPO | Attending: Internal Medicine

## 2019-05-17 DIAGNOSIS — Z23 Encounter for immunization: Secondary | ICD-10-CM

## 2019-05-17 NOTE — Progress Notes (Signed)
   Covid-19 Vaccination Clinic  Name:  Donna Holmes    MRN: [id number] DOB: 08-09-1950  05/17/2019  Donna Holmes was observed post Covid-19 immunization for 15 minutes without incident. She was provided with Vaccine Information Sheet and instruction to access the V-Safe system.   Donna Holmes was instructed to call 911 with any severe reactions post vaccine: Marland Kitchen Difficulty breathing  . Swelling of face and throat  . A fast heartbeat  . A bad rash all over body  . Dizziness and weakness   Immunizations Administered    Name Date Dose VIS Date Route   Pfizer COVID-19 Vaccine 05/17/2019  3:00 PM 0.3 mL 02/23/2019 Intramuscular   Manufacturer: [place]   Lot: [id number]   [place]: [id number]

## 2019-05-18 LAB — TESTOSTERONE: Testosterone: 26 ng/dL (ref 3–41)

## 2019-05-18 LAB — DHEA-SULFATE, SERUM: DHEA-Sulfate, LCMS: 153 ug/dL — ABNORMAL HIGH

## 2019-05-18 LAB — ESTRADIOL, FREE
Estradiol, Serum, MS: 12 pg/mL
Free Estradiol, Percent: 1.9 %
Free Estradiol, Serum: 0.23 pg/mL — ABNORMAL LOW

## 2019-05-18 LAB — PROGESTERONE: Progesterone: 0.4 ng/mL

## 2019-06-08 ENCOUNTER — Encounter: Payer: Self-pay | Admitting: Internal Medicine

## 2019-06-08 DIAGNOSIS — R300 Dysuria: Secondary | ICD-10-CM

## 2019-06-11 ENCOUNTER — Other Ambulatory Visit (INDEPENDENT_AMBULATORY_CARE_PROVIDER_SITE_OTHER): Payer: Medicare PPO

## 2019-06-11 DIAGNOSIS — R300 Dysuria: Secondary | ICD-10-CM | POA: Diagnosis not present

## 2019-06-11 LAB — URINALYSIS, ROUTINE W REFLEX MICROSCOPIC
Bilirubin Urine: NEGATIVE
Hgb urine dipstick: NEGATIVE
Ketones, ur: NEGATIVE
Leukocytes,Ua: NEGATIVE
Nitrite: NEGATIVE
RBC / HPF: NONE SEEN (ref 0–?)
Specific Gravity, Urine: 1.01 (ref 1.000–1.030)
Total Protein, Urine: NEGATIVE
Urine Glucose: NEGATIVE
Urobilinogen, UA: 0.2 (ref 0.0–1.0)
pH: 6.5 (ref 5.0–8.0)

## 2019-06-13 ENCOUNTER — Other Ambulatory Visit: Payer: Self-pay | Admitting: Internal Medicine

## 2019-06-13 LAB — URINE CULTURE
MICRO NUMBER:: 10302282
SPECIMEN QUALITY:: ADEQUATE

## 2019-06-13 MED ORDER — CIPROFLOXACIN HCL 500 MG PO TABS: 500.0000 mg | ORAL_TABLET | Freq: Two times a day (BID) | ORAL | 0 refills | Status: AC

## 2019-07-17 ENCOUNTER — Encounter: Payer: Self-pay | Admitting: Internal Medicine

## 2019-07-17 ENCOUNTER — Ambulatory Visit: Payer: Medicare PPO | Admitting: Internal Medicine

## 2019-07-17 ENCOUNTER — Other Ambulatory Visit: Payer: Self-pay

## 2019-07-17 VITALS — BP 120/76 | HR 68 | Temp 98.3°F | Ht 63.0 in | Wt 134.0 lb

## 2019-07-17 DIAGNOSIS — D509 Iron deficiency anemia, unspecified: Secondary | ICD-10-CM | POA: Diagnosis not present

## 2019-07-17 DIAGNOSIS — E785 Hyperlipidemia, unspecified: Secondary | ICD-10-CM | POA: Diagnosis not present

## 2019-07-17 DIAGNOSIS — J309 Allergic rhinitis, unspecified: Secondary | ICD-10-CM | POA: Diagnosis not present

## 2019-07-17 DIAGNOSIS — R739 Hyperglycemia, unspecified: Secondary | ICD-10-CM

## 2019-07-17 NOTE — Assessment & Plan Note (Signed)
Statin intolerant, for low chol diet

## 2019-07-17 NOTE — Patient Instructions (Signed)
Please continue all other medications as before, and refills have been done if requested.  Please have the pharmacy call with any other refills you may need.  Please continue your efforts at being more active, low cholesterol diet, and weight control.  Please keep your appointments with your specialists as you may have planned  Please make an Appointment to return in 6 months, or sooner if needed

## 2019-07-17 NOTE — Progress Notes (Signed)
Subjective:    Patient ID: Donna Holmes, female    DOB: 1951-02-11, 69 y.o.   MRN: [id number]  HPI  Here to f/u; overall doing ok,  Pt denies chest pain, increasing sob or doe, wheezing, orthopnea, PND, increased LE swelling, palpitations, dizziness or syncope.  Pt denies new neurological symptoms such as new headache, or facial or extremity weakness or numbness.  Pt denies polydipsia, polyuria, or low sugar episode.  Pt states overall good compliance with meds, mostly trying to follow appropriate diet, with wt overall stable,  but little exercise however. S/p UTI x 2 already this year, Denies urinary symptoms such as dysuria, frequency, urgency, flank pain, hematuria or n/v, fever, chills.   Past Medical History:  Diagnosis Date  . ALLERGIC RHINITIS 05/19/2009  . Anemia, iron deficiency 06/09/2015  . B12 deficiency 08/02/2018  . 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 Complication of anesthesia   . GERD (gastroesophageal reflux disease)   . Headache(784.0) 05/19/2009   Past Surgical History:  Procedure Laterality Date  . ANTERIOR AND POSTERIOR REPAIR WITH SACROSPINOUS FIXATION N/A 05/16/2014   Procedure: ANTERIOR AND POSTERIOR REPAIR WITH SACROSPINOUS LIGAMENT SUSPENSION;  Surgeon: Luz Lex, MD;  Location: [facility];  Service: Gynecology;  Laterality: N/A;  . BARTHOLIN CYST MARSUPIALIZATION    . BUNIONECTOMY Right   . LAPAROSCOPIC ASSISTED VAGINAL HYSTERECTOMY N/A 05/16/2014   Procedure: LAPAROSCOPIC ASSISTED VAGINAL HYSTERECTOMY;  Surgeon: Luz Lex, MD;  Location: [facility];  Service: Gynecology;  Laterality: N/A;  . NEUROMA SURGERY Right    2nd interspace    reports that she has never smoked. She has never used smokeless tobacco. She reports that she does not drink alcohol or use drugs. family history includes Alcohol abuse in her father; Hypertension in her mother; Prostate cancer in her maternal uncle. Allergies  Allergen Reactions  . Codeine Nausea And Vomiting  . Oxycontin [Oxycodone Hcl]  Nausea Only and Other (See Comments)    FELT LIKE I WAS GOING TO FAINT  . Statins Other (See Comments)    myalgias   Current Outpatient Medications on File Prior to Visit  Medication Sig Dispense Refill  . acetaminophen (TYLENOL) 500 MG tablet Take 500 mg by mouth every 6 (six) hours as needed.    . calcium carbonate (OS-CAL) 600 MG TABS tablet Take 600 mg by mouth daily.    . cetirizine-pseudoephedrine (ZYRTEC-D) 5-120 MG per tablet Take 1 tablet by mouth once as needed for allergies.    . Cholecalciferol (VITAMIN D-3 PO) Take 4,000 Units by mouth daily.    . Estradiol-Estriol-Progesterone (BIEST/PROGESTERONE) CREA Apply 2 clicks daily, except on Sunday. 40 g PRN  . Glucosamine-Chondroitin (GLUCOSAMINE CHONDR COMPLEX PO) Take 1 tablet by mouth daily.    . Omega-3 Fatty Acids (FISH OIL) 1000 MG CAPS Take 1,000 mg by mouth daily.    . pantoprazole (PROTONIX) 40 MG tablet TAKE 1 TABLET BY MOUTH EVERY DAY 90 tablet 1   No current facility-administered medications on file prior to visit.   Review of Systems All otherwise neg per pt    Objective:   Physical Exam BP 120/76 (BP Location: Left Arm, Patient Position: Sitting, Cuff Size: Small)   Pulse 68   Temp 98.3 F (36.8 C) (Oral)   Ht _0  (1.6 m)   Wt 134 lb (60.8 kg)   SpO2 94%   BMI 23.74 kg/m  VS noted,  Constitutional: Pt appears in NAD HENT: Head: NCAT.  Right Ear: External ear normal.  Left  Ear: External ear normal.  Eyes: . Pupils are equal, round, and reactive to light. Conjunctivae and EOM are normal Nose: without d/c or deformity Neck: Neck supple. Gross normal ROM Cardiovascular: Normal rate and regular rhythm.   Pulmonary/Chest: Effort normal and breath sounds without rales or wheezing.  Abd:  Soft, NT, ND, + BS, no organomegaly Neurological: Pt is alert. At baseline orientation, motor grossly intact Skin: Skin is warm. No rashes, other new lesions, no LE edema Psychiatric: Pt behavior is normal without  agitation  All otherwise neg per pt Lab Results  Component Value Date   WBC 9.8 03/19/2019   HGB 12.8 03/19/2019   HCT 38.9 03/19/2019   PLT 305.0 03/19/2019   GLUCOSE 97 03/19/2019   CHOL 245 (H) 03/19/2019   TRIG 267.0 (H) 03/19/2019   HDL 46.90 03/19/2019   LDLDIRECT 167.0 03/19/2019   LDLCALC 124 (H) 08/01/2018   ALT 25 03/19/2019   AST 20 03/19/2019   NA 140 03/19/2019   K 4.0 03/19/2019   CL 102 03/19/2019   CREATININE 0.77 03/19/2019   BUN 19 03/19/2019   CO2 29 03/19/2019   TSH 1.15 03/19/2019   HGBA1C 6.3 03/19/2019      Assessment & Plan:

## 2019-07-18 ENCOUNTER — Encounter: Payer: Self-pay | Admitting: Internal Medicine

## 2019-07-18 NOTE — Assessment & Plan Note (Addendum)
stable overall by history and exam, recent data reviewed with pt, and pt to continue medical treatment as before,  to f/u any worsening symptoms or concerns  I spent 31 minutes in preparing to see the patient by review of recent labs, imaging and procedures, obtaining and reviewing separately obtained history, communicating with the patient and family or caregiver, ordering medications, tests or procedures, and documenting clinical information in the EHR including the differential Dx, treatment, and any further evaluation and other management of iron def anemia, allergies, hyperglycemia, HLD

## 2019-07-18 NOTE — Assessment & Plan Note (Signed)
For contd zyrtec prn,  to f/u any worsening symptoms or concerns

## 2019-07-18 NOTE — Assessment & Plan Note (Signed)
stable overall by history and exam, recent data reviewed with pt, and pt to continue medical treatment as before,  to f/u any worsening symptoms or concerns  

## 2019-07-29 ENCOUNTER — Other Ambulatory Visit: Payer: Self-pay | Admitting: Internal Medicine

## 2019-07-29 NOTE — Telephone Encounter (Signed)
Please refill as per office routine med refill policy (all routine meds refilled for 3 mo or monthly per pt preference up to one year from last visit, then month to month grace period for 3 mo, then further med refills will have to be denied)  

## 2019-08-01 ENCOUNTER — Ambulatory Visit: Payer: Medicare PPO | Admitting: Internal Medicine

## 2019-08-11 ENCOUNTER — Encounter: Payer: Self-pay | Admitting: Internal Medicine

## 2019-08-23 DIAGNOSIS — H16223 Keratoconjunctivitis sicca, not specified as Sjogren's, bilateral: Secondary | ICD-10-CM | POA: Diagnosis not present

## 2019-08-23 DIAGNOSIS — H16143 Punctate keratitis, bilateral: Secondary | ICD-10-CM | POA: Diagnosis not present

## 2019-09-06 DIAGNOSIS — H16223 Keratoconjunctivitis sicca, not specified as Sjogren's, bilateral: Secondary | ICD-10-CM | POA: Diagnosis not present

## 2019-09-06 DIAGNOSIS — H1589 Other disorders of sclera: Secondary | ICD-10-CM | POA: Diagnosis not present

## 2019-10-01 DIAGNOSIS — H16223 Keratoconjunctivitis sicca, not specified as Sjogren's, bilateral: Secondary | ICD-10-CM | POA: Diagnosis not present

## 2019-10-01 DIAGNOSIS — H02883 Meibomian gland dysfunction of right eye, unspecified eyelid: Secondary | ICD-10-CM | POA: Diagnosis not present

## 2019-10-01 DIAGNOSIS — H02886 Meibomian gland dysfunction of left eye, unspecified eyelid: Secondary | ICD-10-CM | POA: Diagnosis not present

## 2019-10-31 ENCOUNTER — Encounter: Payer: Self-pay | Admitting: Internal Medicine

## 2019-10-31 ENCOUNTER — Other Ambulatory Visit: Payer: Self-pay

## 2019-10-31 ENCOUNTER — Ambulatory Visit: Payer: Medicare PPO | Admitting: Internal Medicine

## 2019-10-31 VITALS — BP 112/74 | HR 76 | Temp 97.9°F | Ht 63.0 in | Wt 135.2 lb

## 2019-10-31 DIAGNOSIS — Z79890 Hormone replacement therapy (postmenopausal): Secondary | ICD-10-CM

## 2019-10-31 DIAGNOSIS — N951 Menopausal and female climacteric states: Secondary | ICD-10-CM | POA: Diagnosis not present

## 2019-10-31 NOTE — Progress Notes (Signed)
This visit occurred during the SARS-CoV-2 public health emergency. Safety protocols were in place, including screening questions prior to the visit, additional usage of staff PPE, and extensive cleaning of exam room while observing appropriate contact time as indicated for disinfecting solutions.  Subjective:     Patient ID: Donna Holmes , female    DOB: 1950/06/28 , 69 y.o.   MRN: [id number]      Chief Complaint  Patient presents with  . Hormones f/u    HPI  She is here today for f/u BHRT. She reports compliance with her current regimen. Needs refill.         Past Medical History:  Diagnosis Date  . ALLERGIC RHINITIS 05/19/2009  . Anemia, iron deficiency 06/09/2015  . B12 deficiency 08/02/2018  . 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 Complication of anesthesia   . GERD (gastroesophageal reflux disease)   . Headache(784.0) 05/19/2009          Family History  Problem Relation Age of Onset  . Alcohol abuse Father   . Hypertension Mother   . Prostate cancer Maternal Uncle      Current Outpatient Medications:  .  acetaminophen (TYLENOL) 500 MG tablet, Take 500 mg by mouth every 6 (six) hours as needed., Disp: , Rfl:  .  calcium carbonate (OS-CAL) 600 MG TABS tablet, Take 600 mg by mouth daily., Disp: , Rfl:  .  cetirizine-pseudoephedrine (ZYRTEC-D) 5-120 MG per tablet, Take 1 tablet by mouth once as needed for allergies., Disp: , Rfl:  .  Cholecalciferol (VITAMIN D-3 PO), Take 4,000 Units by mouth daily., Disp: , Rfl:  .  Estradiol-Estriol-Progesterone (BIEST/PROGESTERONE) CREA, Apply 2 clicks daily, except on Sunday., Disp: 40 g, Rfl: PRN .  Glucosamine-Chondroitin (GLUCOSAMINE CHONDR COMPLEX PO), Take 1 tablet by mouth daily., Disp: , Rfl:  .  Omega-3 Fatty Acids (FISH OIL) 1000 MG CAPS, Take 3 capsules by mouth daily. , Disp: , Rfl:  .  pantoprazole (PROTONIX) 40 MG tablet, TAKE 1 TABLET BY MOUTH EVERY DAY, Disp: 90 tablet, Rfl: 1        Allergies   Allergen Reactions  . Codeine Nausea And Vomiting  . Oxycontin [Oxycodone Hcl] Nausea Only and Other (See Comments)    FELT LIKE I WAS GOING TO FAINT  . Statins Other (See Comments)    myalgias    Review of Systems  Constitutional: Negative.   Respiratory: Negative.   Cardiovascular: Negative.   Gastrointestinal: Negative.   Psychiatric/Behavioral: Negative.   All other systems reviewed and are negative.       Today's Vitals   10/31/19 1410  BP: 112/74  Pulse: 76  Temp: 97.9 F (36.6 C)  TempSrc: Oral  Weight: 135 lb 3.2 oz (61.3 kg)  Height: _0  (1.6 m)  PainSc: 0-No pain   Body mass index is 23.95 kg/m.     Wt Readings from Last 3 Encounters:  10/31/19 135 lb 3.2 oz (61.3 kg)  07/17/19 134 lb (60.8 kg)  03/19/19 134 lb (60.8 kg)   Objective:  Physical Exam Vitals and nursing note reviewed.  Constitutional:      Appearance: Normal appearance.  HENT:     Head: Normocephalic and atraumatic.  Cardiovascular:     Rate and Rhythm: Normal rate and regular rhythm.     Heart sounds: Normal heart sounds.  Pulmonary:     Breath sounds: Normal breath sounds.  Skin:    General: Skin is warm.  Neurological:     General: No  focal deficit present.     Mental Status: She is alert and oriented to person, place, and time.         Assessment And Plan:     1. 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 Refills will be called in, 60 day supply as requested. She will rto in 4-6 months for re-evaluation. Previous labs reviewed.  - Estradiol  2. Need for postmenopausal hormone replacement Comments: I will check labs as listed below.  - Estradiol - Testosterone, Total - Liver Profile - Estradiol    Patient was given opportunity to ask questions. Patient verbalized understanding of the plan and was able to repeat key elements of the plan. All questions were answered to their satisfaction.   Michelle Nasuti, [place], Michelle Nasuti, CMA, have reviewed all documentation for this visit. The documentation on 10/31/19 for the exam, diagnosis, procedures, and orders are all accurate and complete.  THE PATIENT IS ENCOURAGED TO PRACTICE SOCIAL DISTANCING DUE TO THE COVID-19 PANDEMIC.

## 2019-10-31 NOTE — Patient Instructions (Signed)
Menopause and Hormone Replacement Therapy Menopause is a normal time of life when menstrual periods stop completely and the ovaries stop producing the female hormones estrogen and progesterone. This lack of hormones can affect your health and cause undesirable symptoms. Hormone replacement therapy (HRT) can relieve some of those symptoms. What is hormone replacement therapy? HRT is the use of artificial (synthetic) hormones to replace hormones that your body has stopped producing because you have reached menopause. What are my options for HRT?  HRT may consist of the synthetic hormones estrogen and progestin, or it may consist of only estrogen (estrogen-only therapy). You and your health care provider will decide which form of HRT is best for you. If you choose to be on HRT and you have a uterus, estrogen and progestin are usually prescribed. Estrogen-only therapy is used for women who do not have a uterus. Possible options for taking HRT include:  Pills.  Patches.  Gels.  Sprays.  Vaginal cream.  Vaginal rings.  Vaginal inserts. The amount of hormone(s) that you take and how long you take the hormone(s) varies according to your health. It is important to:  Begin HRT with the lowest possible dosage.  Stop HRT as soon as your health care provider tells you to stop.  Work with your health care provider so that you feel informed and comfortable with your decisions. What are the benefits of HRT? HRT can reduce the frequency and severity of menopausal symptoms. Benefits of HRT vary according to the kind of symptoms that you have, how severe they are, and your overall health. HRT may help to improve the following symptoms of menopause:  Hot flashes and night sweats. These are sudden feelings of heat that spread over the face and body. The skin may turn red, like a blush. Night sweats are hot flashes that happen while you are sleeping or trying to sleep.  Bone loss (osteoporosis). The  body loses calcium more quickly after menopause, causing the bones to become weaker. This can increase the risk for bone breaks (fractures).  Vaginal dryness. The lining of the vagina can become thin and dry, which can cause pain during sex or cause infection, burning, or itching.  Urinary tract infections.  Urinary incontinence. This is the inability to control when you pass urine.  Irritability.  Short-term memory problems. What are the risks of HRT? Risks of HRT vary depending on your individual health and medical history. Risks of HRT also depend on whether you receive both estrogen and progestin or you receive estrogen only. HRT may increase the risk of:  Spotting. This is when a small amount of blood leaks from the vagina unexpectedly.  Endometrial cancer. This cancer is in the lining of the uterus (endometrium).  Breast cancer.  Increased density of breast tissue. This can make it harder to find breast cancer on a breast X-ray (mammogram).  Stroke.  Heart disease.  Blood clots.  Gallbladder disease.  Liver disease. Risks of HRT can increase if you have any of the following conditions:  Endometrial cancer.  Liver disease.  Heart disease.  Breast cancer.  History of blood clots.  History of stroke. Follow these instructions at home:  Take over-the-counter and prescription medicines only as told by your health care provider.  Get mammograms, pelvic exams, and medical checkups as often as told by your health care provider.  Have Pap tests done as often as told by your health care provider. A Pap test is sometimes called a Pap smear. It  is a screening test that is used to check for signs of cancer of the cervix and vagina. A Pap test can also identify the presence of infection or precancerous changes. Pap tests may be done: ? Every 3 years, starting at age 54. ? Every 5 years, starting after age 43, in combination with testing for human papillomavirus  (HPV). ? More often or less often depending on other medical conditions you have, your age, and other risk factors.  It is up to you to get the results of your Pap test. Ask your health care provider, or the department that is doing the test, when your results will be ready.  Keep all follow-up visits as told by your health care provider. This is important. Contact a health care provider if you have:  Pain or swelling in your legs.  Shortness of breath.  Chest pain.  Lumps or changes in your breasts or armpits.  Slurred speech.  Pain, burning, or bleeding when you urinate.  Unusual vaginal bleeding.  Dizziness or headaches.  Weakness or numbness in any part of your arms or legs.  Pain in your abdomen. Summary  Menopause is a normal time of life when menstrual periods stop completely and the ovaries stop producing the female hormones estrogen and progesterone.  Hormone replacement therapy (HRT) can relieve some of the symptoms of menopause.  HRT can reduce the frequency and severity of menopausal symptoms.  Risks of HRT vary depending on your individual health and medical history. This information is not intended to replace advice given to you by your health care provider. Make sure you discuss any questions you have with your health care provider. Document Revised: 11/01/2017 Document Reviewed: 11/01/2017 Elsevier Patient Education  2020 [employer].

## 2019-11-01 LAB — HEPATIC FUNCTION PANEL
ALT: 46 IU/L — ABNORMAL HIGH (ref 0–32)
AST: 34 IU/L (ref 0–40)
Albumin: 5 g/dL — ABNORMAL HIGH (ref 3.7–4.7)
Alkaline Phosphatase: 76 IU/L (ref 48–121)
Bilirubin Total: 0.5 mg/dL (ref 0.0–1.2)
Bilirubin, Direct: 0.13 mg/dL (ref 0.00–0.40)
Total Protein: 7.8 g/dL (ref 6.0–8.5)

## 2019-11-01 LAB — ESTRADIOL: Estradiol: 10.3 pg/mL

## 2019-11-01 LAB — TESTOSTERONE: Testosterone: 20 ng/dL (ref 3–67)

## 2019-11-12 ENCOUNTER — Encounter: Payer: Self-pay | Admitting: Internal Medicine

## 2019-11-12 DIAGNOSIS — R300 Dysuria: Secondary | ICD-10-CM

## 2019-11-13 ENCOUNTER — Other Ambulatory Visit: Payer: Medicare PPO

## 2019-11-13 NOTE — Telephone Encounter (Signed)
[facility] labs are ordered

## 2019-11-14 ENCOUNTER — Other Ambulatory Visit: Payer: Medicare PPO

## 2019-11-14 ENCOUNTER — Other Ambulatory Visit: Payer: Self-pay

## 2019-11-14 DIAGNOSIS — R300 Dysuria: Secondary | ICD-10-CM | POA: Diagnosis not present

## 2019-11-15 NOTE — Telephone Encounter (Signed)
Patient would like an abx called in today. She states she is leaving to go out of town and wants an abx before she leaves to go out of town.  She would like abx sent to [facility].

## 2019-11-15 NOTE — Telephone Encounter (Signed)
° ° °  Patient calling to request status on antibiotic being called to pharmacy.

## 2019-11-16 LAB — URINALYSIS, ROUTINE W REFLEX MICROSCOPIC
Bilirubin Urine: NEGATIVE
Glucose, UA: NEGATIVE
Hgb urine dipstick: NEGATIVE
Hyaline Cast: NONE SEEN /LPF
Ketones, ur: NEGATIVE
Nitrite: NEGATIVE
Protein, ur: NEGATIVE
RBC / HPF: NONE SEEN /HPF (ref 0–2)
Specific Gravity, Urine: 1.017 (ref 1.001–1.03)
Squamous Epithelial / HPF: NONE SEEN /HPF (ref <=5)
pH: 5.5 (ref 5.0–8.0)

## 2019-11-16 LAB — URINE CULTURE

## 2019-11-17 ENCOUNTER — Other Ambulatory Visit: Payer: Self-pay | Admitting: Internal Medicine

## 2019-11-17 ENCOUNTER — Encounter: Payer: Self-pay | Admitting: Internal Medicine

## 2019-11-17 MED ORDER — CIPROFLOXACIN HCL 500 MG PO TABS: 500.0000 mg | ORAL_TABLET | Freq: Two times a day (BID) | ORAL | 0 refills | Status: AC

## 2019-11-22 ENCOUNTER — Ambulatory Visit: Payer: Medicare PPO | Admitting: Internal Medicine

## 2020-01-11 ENCOUNTER — Other Ambulatory Visit: Payer: Self-pay | Admitting: Obstetrics and Gynecology

## 2020-01-11 DIAGNOSIS — Z1231 Encounter for screening mammogram for malignant neoplasm of breast: Secondary | ICD-10-CM

## 2020-01-16 ENCOUNTER — Other Ambulatory Visit: Payer: Self-pay

## 2020-01-16 ENCOUNTER — Encounter: Payer: Self-pay | Admitting: Internal Medicine

## 2020-01-16 ENCOUNTER — Ambulatory Visit: Payer: Medicare PPO | Admitting: Internal Medicine

## 2020-01-16 VITALS — BP 110/74 | HR 80 | Temp 98.2°F | Ht 63.0 in | Wt 135.0 lb

## 2020-01-16 DIAGNOSIS — E785 Hyperlipidemia, unspecified: Secondary | ICD-10-CM

## 2020-01-16 DIAGNOSIS — Z23 Encounter for immunization: Secondary | ICD-10-CM

## 2020-01-16 DIAGNOSIS — M545 Low back pain, unspecified: Secondary | ICD-10-CM

## 2020-01-16 DIAGNOSIS — R739 Hyperglycemia, unspecified: Secondary | ICD-10-CM | POA: Diagnosis not present

## 2020-01-16 LAB — URINALYSIS, ROUTINE W REFLEX MICROSCOPIC
Bilirubin Urine: NEGATIVE
Hgb urine dipstick: NEGATIVE
Ketones, ur: NEGATIVE
Leukocytes,Ua: NEGATIVE
Nitrite: NEGATIVE
RBC / HPF: NONE SEEN (ref 0–?)
Specific Gravity, Urine: 1.015 (ref 1.000–1.030)
Total Protein, Urine: NEGATIVE
Urine Glucose: NEGATIVE
Urobilinogen, UA: 0.2 (ref 0.0–1.0)
pH: 7 (ref 5.0–8.0)

## 2020-01-16 NOTE — Progress Notes (Signed)
Subjective:    Patient ID: Donna Holmes, female    DOB: 09-17-1950, 70 y.o.   MRN: [id number]  HPI  Here to f/u; overall doing ok,  Pt denies chest pain, increasing sob or doe, wheezing, orthopnea, PND, increased LE swelling, palpitations, dizziness or syncope.  Pt denies new neurological symptoms such as new headache, or facial or extremity weakness or numbness.  Pt denies polydipsia, polyuria, or low sugar episode.  Pt states overall good compliance with meds, mostly trying to follow appropriate diet, with wt overall stable,  but little exercise however.  Also, Pt continues to have recurring LBP without change in severity, bowel or bladder change, fever, wt loss,  worsening LE pain/numbness/weakness, gait change or falls, Denies urinary symptoms such as dysuria, frequency, urgency, flank pain, hematuria or n/v, fever, chills, but asks for Urine studies Past Medical History:  Diagnosis Date  . ALLERGIC RHINITIS 05/19/2009  . Anemia, iron deficiency 06/09/2015  . B12 deficiency 08/02/2018  . 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 Complication of anesthesia   . GERD (gastroesophageal reflux disease)   . Headache(784.0) 05/19/2009   Past Surgical History:  Procedure Laterality Date  . ANTERIOR AND POSTERIOR REPAIR WITH SACROSPINOUS FIXATION N/A 05/16/2014   Procedure: ANTERIOR AND POSTERIOR REPAIR WITH SACROSPINOUS LIGAMENT SUSPENSION;  Surgeon: Luz Lex, MD;  Location: [facility];  Service: Gynecology;  Laterality: N/A;  . BARTHOLIN CYST MARSUPIALIZATION    . BUNIONECTOMY Right   . LAPAROSCOPIC ASSISTED VAGINAL HYSTERECTOMY N/A 05/16/2014   Procedure: LAPAROSCOPIC ASSISTED VAGINAL HYSTERECTOMY;  Surgeon: Luz Lex, MD;  Location: [facility];  Service: Gynecology;  Laterality: N/A;  . NEUROMA SURGERY Right    2nd interspace    reports that she has never smoked. She has never used smokeless tobacco. She reports that she does not drink alcohol and does not use drugs. family history includes Alcohol abuse  in her father; Hypertension in her mother; Prostate cancer in her maternal uncle. Allergies  Allergen Reactions  . Codeine Nausea And Vomiting  . Oxycontin [Oxycodone Hcl] Nausea Only and Other (See Comments)    FELT LIKE I WAS GOING TO FAINT  . Statins Other (See Comments)    myalgias   Current Outpatient Medications on File Prior to Visit  Medication Sig Dispense Refill  . acetaminophen (TYLENOL) 500 MG tablet Take 500 mg by mouth every 6 (six) hours as needed.    . calcium carbonate (OS-CAL) 600 MG TABS tablet Take 600 mg by mouth daily.    . cetirizine-pseudoephedrine (ZYRTEC-D) 5-120 MG per tablet Take 1 tablet by mouth once as needed for allergies.    . Cholecalciferol (VITAMIN D-3 PO) Take 4,000 Units by mouth daily.    . Estradiol-Estriol-Progesterone (BIEST/PROGESTERONE) CREA Apply 2 clicks daily, except on Sunday. 40 g PRN  . Glucosamine-Chondroitin (GLUCOSAMINE CHONDR COMPLEX PO) Take 1 tablet by mouth daily.    . Omega-3 Fatty Acids (FISH OIL) 1000 MG CAPS Take 3 capsules by mouth daily.     . pantoprazole (PROTONIX) 40 MG tablet TAKE 1 TABLET BY MOUTH EVERY DAY 90 tablet 1   No current facility-administered medications on file prior to visit.   Review of Systems All otherwise neg per pt    Objective:   Physical Exam BP 110/74 (BP Location: Left Arm, Patient Position: Sitting, Cuff Size: Large)   Pulse 80   Temp 98.2 F (36.8 C) (Oral)   Ht _0  (1.6 m)   Wt 135 lb (61.2 kg)   SpO2  97%   BMI 23.91 kg/m  VS noted,  Constitutional: Pt appears in NAD HENT: Head: NCAT.  Right Ear: External ear normal.  Left Ear: External ear normal.  Eyes: . Pupils are equal, round, and reactive to light. Conjunctivae and EOM are normal Nose: without d/c or deformity Neck: Neck supple. Gross normal ROM Cardiovascular: Normal rate and regular rhythm.   Pulmonary/Chest: Effort normal and breath sounds without rales or wheezing.  Abd:  Soft, NT, ND, + BS, no  organomegaly Neurological: Pt is alert. At baseline orientation, motor grossly intact Skin: Skin is warm. No rashes, other new lesions, no LE edema Psychiatric: Pt behavior is normal without agitation  All otherwise neg per pt Lab Results  Component Value Date   WBC 9.8 03/19/2019   HGB 12.8 03/19/2019   HCT 38.9 03/19/2019   PLT 305.0 03/19/2019   GLUCOSE 97 03/19/2019   CHOL 245 (H) 03/19/2019   TRIG 267.0 (H) 03/19/2019   HDL 46.90 03/19/2019   LDLDIRECT 167.0 03/19/2019   LDLCALC 124 (H) 08/01/2018   ALT 46 (H) 10/31/2019   AST 34 10/31/2019   NA 140 03/19/2019   K 4.0 03/19/2019   CL 102 03/19/2019   CREATININE 0.77 03/19/2019   BUN 19 03/19/2019   CO2 29 03/19/2019   TSH 1.15 03/19/2019   HGBA1C 6.3 03/19/2019      Assessment & Plan:

## 2020-01-16 NOTE — Patient Instructions (Addendum)
You had the flu shot today  Please continue all other medications as before, and refills have been done if requested.  Please have the pharmacy call with any other refills you may need.  Please continue your efforts at being more active, low cholesterol diet, and weight control.  Please keep your appointments with your specialists as you may have planned  Please go to the LAB at the blood drawing area for the tests to be done - just the urine testing today  You will be contacted by phone if any changes need to be made immediately.  Otherwise, you will receive a letter about your results with an explanation, but please check with MyChart first.  Please remember to sign up for MyChart if you have not done so, as this will be important to you in the future with finding out test results, communicating by private email, and scheduling acute appointments online when needed.  Please make an Appointment to return in 3 months

## 2020-01-17 ENCOUNTER — Ambulatory Visit: Payer: Medicare PPO | Admitting: Internal Medicine

## 2020-01-18 ENCOUNTER — Other Ambulatory Visit: Payer: Self-pay | Admitting: Internal Medicine

## 2020-01-18 ENCOUNTER — Encounter: Payer: Self-pay | Admitting: Internal Medicine

## 2020-01-18 LAB — URINE CULTURE

## 2020-01-18 MED ORDER — AMOXICILLIN 500 MG PO CAPS: 500.0000 mg | ORAL_CAPSULE | Freq: Three times a day (TID) | ORAL | 0 refills | Status: DC

## 2020-01-20 ENCOUNTER — Encounter: Payer: Self-pay | Admitting: Internal Medicine

## 2020-01-20 NOTE — Assessment & Plan Note (Addendum)
C/w msk strain left sided primarily, for UA r/o gU source,  to f/u any worsening symptoms or concerns  I spent 31 minutes in preparing to see the patient by review of recent labs, imaging and procedures, obtaining and reviewing separately obtained history, communicating with the patient and family or caregiver, ordering medications, tests or procedures, and documenting clinical information in the EHR including the differential Dx, treatment, and any further evaluation and other management of lbp, hyperglycemia, hld

## 2020-01-20 NOTE — Assessment & Plan Note (Signed)
stable overall by history and exam, recent data reviewed with pt, and pt to continue medical treatment as before,  to f/u any worsening symptoms or concerns  

## 2020-01-23 ENCOUNTER — Other Ambulatory Visit: Payer: Self-pay | Admitting: Internal Medicine

## 2020-01-23 NOTE — Telephone Encounter (Signed)
Please refill as per office routine med refill policy (all routine meds refilled for 3 mo or monthly per pt preference up to one year from last visit, then month to month grace period for 3 mo, then further med refills will have to be denied)  

## 2020-02-19 ENCOUNTER — Ambulatory Visit: Payer: Medicare PPO

## 2020-03-12 ENCOUNTER — Encounter: Payer: Self-pay | Admitting: Internal Medicine

## 2020-03-12 DIAGNOSIS — R300 Dysuria: Secondary | ICD-10-CM

## 2020-03-13 ENCOUNTER — Other Ambulatory Visit (INDEPENDENT_AMBULATORY_CARE_PROVIDER_SITE_OTHER): Payer: Medicare PPO

## 2020-03-13 ENCOUNTER — Encounter: Payer: Self-pay | Admitting: Internal Medicine

## 2020-03-13 DIAGNOSIS — R300 Dysuria: Secondary | ICD-10-CM

## 2020-03-13 LAB — URINALYSIS, ROUTINE W REFLEX MICROSCOPIC
Bilirubin Urine: NEGATIVE
Hgb urine dipstick: NEGATIVE
Ketones, ur: NEGATIVE
Leukocytes,Ua: NEGATIVE
Nitrite: POSITIVE — AB
Specific Gravity, Urine: 1.015 (ref 1.000–1.030)
Total Protein, Urine: NEGATIVE
Urine Glucose: NEGATIVE
Urobilinogen, UA: 0.2 (ref 0.0–1.0)
pH: 6 (ref 5.0–8.0)

## 2020-03-15 ENCOUNTER — Other Ambulatory Visit: Payer: Self-pay | Admitting: Internal Medicine

## 2020-03-15 ENCOUNTER — Encounter: Payer: Self-pay | Admitting: Internal Medicine

## 2020-03-15 LAB — URINE CULTURE
MICRO NUMBER:: 11370345
SPECIMEN QUALITY:: ADEQUATE

## 2020-03-15 MED ORDER — CIPROFLOXACIN HCL 500 MG PO TABS: 500.0000 mg | ORAL_TABLET | Freq: Two times a day (BID) | ORAL | 0 refills | Status: AC

## 2020-03-19 ENCOUNTER — Other Ambulatory Visit: Payer: Self-pay | Admitting: Internal Medicine

## 2020-03-19 MED ORDER — LEVOFLOXACIN 500 MG PO TABS: 500.0000 mg | ORAL_TABLET | Freq: Every day | ORAL | 0 refills | Status: AC

## 2020-03-28 ENCOUNTER — Ambulatory Visit
Admission: RE | Admit: 2020-03-28 | Discharge: 2020-03-28 | Disposition: A | Discharge status: Home / Self Care | Payer: Medicare PPO | Source: Ambulatory Visit | Attending: Obstetrics and Gynecology | Admitting: Obstetrics and Gynecology

## 2020-03-28 ENCOUNTER — Other Ambulatory Visit: Payer: Self-pay

## 2020-03-28 DIAGNOSIS — Z1231 Encounter for screening mammogram for malignant neoplasm of breast: Secondary | ICD-10-CM

## 2020-04-15 ENCOUNTER — Encounter: Payer: Self-pay | Admitting: Internal Medicine

## 2020-04-15 ENCOUNTER — Ambulatory Visit: Payer: Medicare PPO | Admitting: Internal Medicine

## 2020-04-15 ENCOUNTER — Other Ambulatory Visit: Payer: Self-pay

## 2020-04-15 ENCOUNTER — Ambulatory Visit (INDEPENDENT_AMBULATORY_CARE_PROVIDER_SITE_OTHER): Payer: Medicare PPO

## 2020-04-15 VITALS — BP 110/70 | HR 87 | Temp 97.7°F | Ht 63.0 in | Wt 136.0 lb

## 2020-04-15 DIAGNOSIS — R0989 Other specified symptoms and signs involving the circulatory and respiratory systems: Secondary | ICD-10-CM

## 2020-04-15 DIAGNOSIS — N390 Urinary tract infection, site not specified: Secondary | ICD-10-CM

## 2020-04-15 DIAGNOSIS — G720 Drug-induced myopathy: Secondary | ICD-10-CM

## 2020-04-15 DIAGNOSIS — E559 Vitamin D deficiency, unspecified: Secondary | ICD-10-CM

## 2020-04-15 DIAGNOSIS — M549 Dorsalgia, unspecified: Secondary | ICD-10-CM | POA: Diagnosis not present

## 2020-04-15 DIAGNOSIS — Z0001 Encounter for general adult medical examination with abnormal findings: Secondary | ICD-10-CM

## 2020-04-15 DIAGNOSIS — T466X5A Adverse effect of antihyperlipidemic and antiarteriosclerotic drugs, initial encounter: Secondary | ICD-10-CM

## 2020-04-15 DIAGNOSIS — E538 Deficiency of other specified B group vitamins: Secondary | ICD-10-CM | POA: Diagnosis not present

## 2020-04-15 DIAGNOSIS — R739 Hyperglycemia, unspecified: Secondary | ICD-10-CM | POA: Diagnosis not present

## 2020-04-15 DIAGNOSIS — E785 Hyperlipidemia, unspecified: Secondary | ICD-10-CM | POA: Diagnosis not present

## 2020-04-15 DIAGNOSIS — R0689 Other abnormalities of breathing: Secondary | ICD-10-CM

## 2020-04-15 LAB — CBC WITH DIFFERENTIAL/PLATELET
Basophils Absolute: 0.2 10*3/uL — ABNORMAL HIGH (ref 0.0–0.1)
Basophils Relative: 2.1 % (ref 0.0–3.0)
Eosinophils Absolute: 0.3 10*3/uL (ref 0.0–0.7)
Eosinophils Relative: 3.3 % (ref 0.0–5.0)
HCT: 38.5 % (ref 36.0–46.0)
Hemoglobin: 12.8 g/dL (ref 12.0–15.0)
Lymphocytes Relative: 35.8 % (ref 12.0–46.0)
Lymphs Abs: 2.8 10*3/uL (ref 0.7–4.0)
MCHC: 33.3 g/dL (ref 30.0–36.0)
MCV: 86.2 fl (ref 78.0–100.0)
Monocytes Absolute: 0.5 10*3/uL (ref 0.1–1.0)
Monocytes Relative: 7 % (ref 3.0–12.0)
Neutro Abs: 4 10*3/uL (ref 1.4–7.7)
Neutrophils Relative %: 51.8 % (ref 43.0–77.0)
Platelets: 323 10*3/uL (ref 150.0–400.0)
RBC: 4.46 Mil/uL (ref 3.87–5.11)
RDW: 14.6 % (ref 11.5–15.5)
WBC: 7.8 10*3/uL (ref 4.0–10.5)

## 2020-04-15 LAB — URINALYSIS, ROUTINE W REFLEX MICROSCOPIC
Bilirubin Urine: NEGATIVE
Hgb urine dipstick: NEGATIVE
Ketones, ur: NEGATIVE
Leukocytes,Ua: NEGATIVE
Nitrite: NEGATIVE
RBC / HPF: NONE SEEN (ref 0–?)
Specific Gravity, Urine: 1.01 (ref 1.000–1.030)
Total Protein, Urine: NEGATIVE
Urine Glucose: NEGATIVE
Urobilinogen, UA: 0.2 (ref 0.0–1.0)
pH: 7 (ref 5.0–8.0)

## 2020-04-15 LAB — BASIC METABOLIC PANEL
BUN: 15 mg/dL (ref 6–23)
CO2: 30 mEq/L (ref 19–32)
Calcium: 10 mg/dL (ref 8.4–10.5)
Chloride: 104 mEq/L (ref 96–112)
Creatinine, Ser: 0.7 mg/dL (ref 0.40–1.20)
GFR: 86.63 mL/min (ref >60.00)
Glucose, Bld: 88 mg/dL (ref 70–99)
Potassium: 4.3 mEq/L (ref 3.5–5.1)
Sodium: 138 mEq/L (ref 135–145)

## 2020-04-15 LAB — TSH: TSH: 1.4 u[IU]/mL (ref 0.35–4.50)

## 2020-04-15 LAB — HEPATIC FUNCTION PANEL
ALT: 34 U/L (ref 0–35)
AST: 30 U/L (ref 0–37)
Albumin: 4.7 g/dL (ref 3.5–5.2)
Alkaline Phosphatase: 67 U/L (ref 39–117)
Bilirubin, Direct: 0.1 mg/dL (ref 0.0–0.3)
Total Bilirubin: 0.7 mg/dL (ref 0.2–1.2)
Total Protein: 7.8 g/dL (ref 6.0–8.3)

## 2020-04-15 LAB — LIPID PANEL
Cholesterol: 217 mg/dL — ABNORMAL HIGH (ref 0–200)
HDL: 47 mg/dL (ref >39.00)
NonHDL: 170.03
Total CHOL/HDL Ratio: 5
Triglycerides: 302 mg/dL — ABNORMAL HIGH (ref 0.0–149.0)
VLDL: 60.4 mg/dL — ABNORMAL HIGH (ref 0.0–40.0)

## 2020-04-15 LAB — LDL CHOLESTEROL, DIRECT: Direct LDL: 149 mg/dL

## 2020-04-15 LAB — VITAMIN D 25 HYDROXY (VIT D DEFICIENCY, FRACTURES): VITD: 83.87 ng/mL (ref 30.00–100.00)

## 2020-04-15 LAB — HEMOGLOBIN A1C: Hgb A1c MFr Bld: 6.2 % (ref 4.6–6.5)

## 2020-04-15 LAB — VITAMIN B12: Vitamin B-12: 312 pg/mL (ref 211–911)

## 2020-04-15 NOTE — Patient Instructions (Signed)
Please continue all other medications as before, and refills have been done if requested.  Please have the pharmacy call with any other refills you may need.  Please continue your efforts at being more active, low cholesterol diet, and weight control.  You are otherwise up to date with prevention measures today.  Please keep your appointments with your specialists as you may have planned  You will be contacted regarding the referral for: urology  Please go to the XRAY Department in the first floor for the x-ray testing  Please go to the LAB at the blood drawing area for the tests to be done  You will be contacted by phone if any changes need to be made immediately.  Otherwise, you will receive a letter about your results with an explanation, but please check with MyChart first.  Please remember to sign up for MyChart if you have not done so, as this will be important to you in the future with finding out test results, communicating by private email, and scheduling acute appointments online when needed.  Please make an Appointment to return in 6 months, or sooner if needed
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
## 2020-04-17 ENCOUNTER — Ambulatory Visit: Payer: Medicare PPO | Admitting: Internal Medicine

## 2020-04-17 ENCOUNTER — Other Ambulatory Visit: Payer: Self-pay | Admitting: Internal Medicine

## 2020-04-17 ENCOUNTER — Encounter: Payer: Self-pay | Admitting: Internal Medicine

## 2020-04-17 LAB — URINE CULTURE

## 2020-04-17 MED ORDER — AMOXICILLIN 500 MG PO CAPS: 500.0000 mg | ORAL_CAPSULE | Freq: Three times a day (TID) | ORAL | 0 refills | Status: DC

## 2020-04-22 ENCOUNTER — Encounter: Payer: Self-pay | Admitting: Internal Medicine

## 2020-04-22 DIAGNOSIS — M549 Dorsalgia, unspecified: Secondary | ICD-10-CM | POA: Insufficient documentation

## 2020-04-22 NOTE — Assessment & Plan Note (Signed)
Etiology uncleacxrr, suspect dry rales - for cxr

## 2020-04-22 NOTE — Assessment & Plan Note (Signed)
Declines further statin for now

## 2020-04-22 NOTE — Assessment & Plan Note (Signed)
asympt now, for UA, and refer urology

## 2020-04-22 NOTE — Assessment & Plan Note (Signed)
Lab Results  Component Value Date   HGBA1C 6.2 04/15/2020   Stable, pt to continue current medical treatment  - diet

## 2020-04-22 NOTE — Assessment & Plan Note (Signed)
C/w msk strain, exam benign, for tylenol prn

## 2020-04-22 NOTE — Assessment & Plan Note (Signed)
Lab Results  Component Value Date   LDLCALC 124 (H) 08/01/2018   elevated, pt with hx of statin myalgias, delcines further statin

## 2020-04-22 NOTE — Assessment & Plan Note (Signed)
Age and sex appropriate education and counseling updated with regular exercise and diet Referrals for preventative services - none needed Immunizations addressed - declines Tdap Smoking counseling  - none needed Evidence for depression or other mood disorder - none significant Most recent labs reviewed. I have personally reviewed and have noted: 1) the patient's medical and social history 2) The patient's current medications and supplements 3) The patient's height, weight, and BMI have been recorded in the chart

## 2020-04-25 DIAGNOSIS — K219 Gastro-esophageal reflux disease without esophagitis: Secondary | ICD-10-CM | POA: Diagnosis not present

## 2020-04-25 DIAGNOSIS — Z823 Family history of stroke: Secondary | ICD-10-CM | POA: Diagnosis not present

## 2020-04-25 DIAGNOSIS — Z87440 Personal history of urinary (tract) infections: Secondary | ICD-10-CM | POA: Diagnosis not present

## 2020-05-07 ENCOUNTER — Other Ambulatory Visit: Payer: Self-pay

## 2020-05-07 ENCOUNTER — Ambulatory Visit: Payer: Medicare PPO | Admitting: Internal Medicine

## 2020-05-07 ENCOUNTER — Encounter: Payer: Self-pay | Admitting: Internal Medicine

## 2020-05-07 VITALS — BP 116/74 | HR 86 | Temp 98.4°F | Ht 63.0 in | Wt 136.0 lb

## 2020-05-07 DIAGNOSIS — Z79899 Other long term (current) drug therapy: Secondary | ICD-10-CM

## 2020-05-07 DIAGNOSIS — N951 Menopausal and female climacteric states: Secondary | ICD-10-CM

## 2020-05-07 NOTE — Progress Notes (Signed)
I,Katawbba Wiggins,acting as a Education administrator for Maximino Greenland, MD.,have documented all rel  This visit occurred during the SARS-CoV-2 public health emergency.  Safety protocols were in place, including screening questions prior to the visit, additional usage of staff PPE, and extensive cleaning of exam room while observing appropriate contact time as indicated for disinfecting solutions.  Subjective:     Patient ID: Donna Holmes , female    DOB: [birth year] , 70 y.o.   MRN: [id number]   Chief Complaint  Patient presents with  . Hormones f/u    HPI  The patient is here today for a follow-up on her hormones. She feels well on her current regimen. She has no specific concerns or complaints. She reports regular f/u with her PCP.    Past Medical History:  Diagnosis Date  . ALLERGIC RHINITIS 05/19/2009  . Anemia, iron deficiency 06/09/2015  . B12 deficiency 08/02/2018  . 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 Complication of anesthesia   . GERD (gastroesophageal reflux disease)   . Headache(784.0) 05/19/2009     Family History  Problem Relation Age of Onset  . Alcohol abuse Father   . Hypertension Mother   . Prostate cancer Maternal Uncle      Current Outpatient Medications:  .  acetaminophen (TYLENOL) 500 MG tablet, Take 500 mg by mouth every 6 (six) hours as needed., Disp: , Rfl:  .  calcium carbonate (OS-CAL) 600 MG TABS tablet, Take 600 mg by mouth daily., Disp: , Rfl:  .  cetirizine-pseudoephedrine (ZYRTEC-D) 5-120 MG per tablet, Take 1 tablet by mouth once as needed for allergies., Disp: , Rfl:  .  Cholecalciferol (VITAMIN D-3 PO), Take 4,000 Units by mouth daily., Disp: , Rfl:  .  Estradiol-Estriol-Progesterone (BIEST/PROGESTERONE) CREA, Apply 2 clicks daily, except on Sunday., Disp: 40 g, Rfl: PRN .  Glucosamine-Chondroitin (GLUCOSAMINE CHONDR COMPLEX PO), Take 1 tablet by mouth daily., Disp: , Rfl:  .  Omega-3 Fatty Acids (FISH OIL) 1000 MG CAPS, Take 3 capsules by mouth daily. ,  Disp: , Rfl:  .  pantoprazole (PROTONIX) 40 MG tablet, TOME UNA TABLETA TODOS LOS DIAS, Disp: 90 tablet, Rfl: 1   Allergies  Allergen Reactions  . Codeine Nausea And Vomiting  . Oxycontin [Oxycodone Hcl] Nausea Only and Other (See Comments)    FELT LIKE I WAS GOING TO FAINT  . Statins Other (See Comments)    myalgias     Review of Systems  Constitutional: Negative.   Respiratory: Negative.   Cardiovascular: Negative.   Gastrointestinal: Negative.   Psychiatric/Behavioral: Negative.   All other systems reviewed and are negative.    Today's Vitals   05/07/20 1435  BP: 116/74  Pulse: 86  Temp: 98.4 F (36.9 C)  TempSrc: Oral  Weight: 136 lb (61.7 kg)  Height: _0  (1.6 m)  PainSc: 0-No pain   Body mass index is 24.09 kg/m.   Objective:  Physical Exam Vitals and nursing note reviewed.  Constitutional:      Appearance: Normal appearance.  HENT:     Head: Normocephalic and atraumatic.     Nose:     Comments: Masked     Mouth/Throat:     Comments: Masked  Cardiovascular:     Rate and Rhythm: Normal rate and regular rhythm.     Pulses: Normal pulses.     Heart sounds: Normal heart sounds.  Pulmonary:     Breath sounds: Normal breath sounds.  Musculoskeletal:     Cervical back: Normal range of  motion.  Skin:    General: Skin is warm.  Neurological:     General: No focal deficit present.     Mental Status: She is alert and oriented to person, place, and time.         Assessment And Plan:     1. Symptomatic menopausal or female climacteric states Comments: Improved with use of BHRT. Labs from her PCP reviewed in full detail. She agrees to f/u in 4-6 months for re-evaluation.   2. Drug therapy Comments: I will check hormone levels as listed below.  - Testosterone, Total - Estradiol   Patient was given opportunity to ask questions. Patient verbalized understanding of the plan and was able to repeat key elements of the plan. All questions were answered to  their satisfaction.   I, Maximino Greenland, MD, have reviewed all documentation for this visit. The documentation on 05/07/20 for the exam, diagnosis, procedures, and orders are all accurate and complete.  THE PATIENT IS ENCOURAGED TO PRACTICE SOCIAL DISTANCING DUE TO THE COVID-19 PANDEMIC.

## 2020-05-08 LAB — ESTRADIOL: Estradiol: <5 pg/mL

## 2020-05-08 LAB — TESTOSTERONE: Testosterone: 15 ng/dL (ref 3–67)

## 2020-05-13 ENCOUNTER — Encounter: Payer: Self-pay | Admitting: Internal Medicine

## 2020-05-13 DIAGNOSIS — F4310 Post-traumatic stress disorder, unspecified: Secondary | ICD-10-CM

## 2020-05-14 ENCOUNTER — Other Ambulatory Visit: Payer: Self-pay | Admitting: Internal Medicine

## 2020-05-14 MED ORDER — TRAZODONE HCL 50 MG PO TABS: 25.0000 mg | ORAL_TABLET | Freq: Every evening | ORAL | 5 refills | Status: DC | PRN

## 2020-05-22 ENCOUNTER — Encounter: Payer: Self-pay | Admitting: Internal Medicine

## 2020-05-22 DIAGNOSIS — N952 Postmenopausal atrophic vaginitis: Secondary | ICD-10-CM | POA: Diagnosis not present

## 2020-05-22 DIAGNOSIS — N3020 Other chronic cystitis without hematuria: Secondary | ICD-10-CM | POA: Diagnosis not present

## 2020-05-27 DIAGNOSIS — H02886 Meibomian gland dysfunction of left eye, unspecified eyelid: Secondary | ICD-10-CM | POA: Diagnosis not present

## 2020-05-27 DIAGNOSIS — H16223 Keratoconjunctivitis sicca, not specified as Sjogren's, bilateral: Secondary | ICD-10-CM | POA: Diagnosis not present

## 2020-05-27 DIAGNOSIS — H02883 Meibomian gland dysfunction of right eye, unspecified eyelid: Secondary | ICD-10-CM | POA: Diagnosis not present

## 2020-05-27 DIAGNOSIS — H16143 Punctate keratitis, bilateral: Secondary | ICD-10-CM | POA: Diagnosis not present

## 2020-06-06 ENCOUNTER — Other Ambulatory Visit: Payer: Self-pay | Admitting: Internal Medicine

## 2020-06-12 ENCOUNTER — Ambulatory Visit (INDEPENDENT_AMBULATORY_CARE_PROVIDER_SITE_OTHER): Payer: Medicare PPO | Admitting: Psychologist

## 2020-06-12 DIAGNOSIS — F4310 Post-traumatic stress disorder, unspecified: Secondary | ICD-10-CM | POA: Diagnosis not present

## 2020-06-12 DIAGNOSIS — Z6824 Body mass index (BMI) 24.0-24.9, adult: Secondary | ICD-10-CM | POA: Diagnosis not present

## 2020-06-12 DIAGNOSIS — N952 Postmenopausal atrophic vaginitis: Secondary | ICD-10-CM | POA: Diagnosis not present

## 2020-06-12 DIAGNOSIS — Z01419 Encounter for gynecological examination (general) (routine) without abnormal findings: Secondary | ICD-10-CM | POA: Diagnosis not present

## 2020-06-19 ENCOUNTER — Telehealth: Payer: Self-pay

## 2020-06-19 ENCOUNTER — Encounter: Payer: Self-pay | Admitting: Internal Medicine

## 2020-06-19 NOTE — Telephone Encounter (Signed)
Left patient a voicemail to call office to schedule and appointment

## 2020-06-19 NOTE — Telephone Encounter (Signed)
Ok to contact pt - ok for ROV for cough and sob if she likes

## 2020-06-20 ENCOUNTER — Ambulatory Visit (INDEPENDENT_AMBULATORY_CARE_PROVIDER_SITE_OTHER): Payer: Medicare PPO | Admitting: Psychologist

## 2020-06-20 DIAGNOSIS — F4310 Post-traumatic stress disorder, unspecified: Secondary | ICD-10-CM | POA: Diagnosis not present

## 2020-07-08 ENCOUNTER — Ambulatory Visit (INDEPENDENT_AMBULATORY_CARE_PROVIDER_SITE_OTHER): Payer: Medicare PPO | Admitting: Psychologist

## 2020-07-08 DIAGNOSIS — F4310 Post-traumatic stress disorder, unspecified: Secondary | ICD-10-CM | POA: Diagnosis not present

## 2020-07-15 ENCOUNTER — Other Ambulatory Visit: Payer: Self-pay | Admitting: Internal Medicine

## 2020-07-15 NOTE — Telephone Encounter (Signed)
Please refill as per office routine med refill policy (all routine meds refilled for 3 mo or monthly per pt preference up to one year from last visit, then month to month grace period for 3 mo, then further med refills will have to be denied)  

## 2020-07-25 ENCOUNTER — Ambulatory Visit: Payer: Medicare PPO | Admitting: Internal Medicine

## 2020-07-25 ENCOUNTER — Ambulatory Visit (INDEPENDENT_AMBULATORY_CARE_PROVIDER_SITE_OTHER): Payer: Medicare PPO

## 2020-07-25 ENCOUNTER — Encounter: Payer: Self-pay | Admitting: Internal Medicine

## 2020-07-25 ENCOUNTER — Other Ambulatory Visit: Payer: Self-pay

## 2020-07-25 VITALS — BP 132/70 | HR 89 | Temp 98.5°F | Ht 63.0 in | Wt 137.0 lb

## 2020-07-25 DIAGNOSIS — E559 Vitamin D deficiency, unspecified: Secondary | ICD-10-CM | POA: Diagnosis not present

## 2020-07-25 DIAGNOSIS — R0689 Other abnormalities of breathing: Secondary | ICD-10-CM

## 2020-07-25 DIAGNOSIS — R0600 Dyspnea, unspecified: Secondary | ICD-10-CM | POA: Diagnosis not present

## 2020-07-25 DIAGNOSIS — R0989 Other specified symptoms and signs involving the circulatory and respiratory systems: Secondary | ICD-10-CM | POA: Diagnosis not present

## 2020-07-25 DIAGNOSIS — R8290 Unspecified abnormal findings in urine: Secondary | ICD-10-CM | POA: Diagnosis not present

## 2020-07-25 DIAGNOSIS — R0602 Shortness of breath: Secondary | ICD-10-CM | POA: Diagnosis not present

## 2020-07-25 DIAGNOSIS — E538 Deficiency of other specified B group vitamins: Secondary | ICD-10-CM | POA: Diagnosis not present

## 2020-07-25 LAB — BASIC METABOLIC PANEL
BUN: 19 mg/dL (ref 6–23)
CO2: 28 mEq/L (ref 19–32)
Calcium: 9.9 mg/dL (ref 8.4–10.5)
Chloride: 101 mEq/L (ref 96–112)
Creatinine, Ser: 0.81 mg/dL (ref 0.40–1.20)
GFR: 72.57 mL/min (ref >60.00)
Glucose, Bld: 95 mg/dL (ref 70–99)
Potassium: 3.7 mEq/L (ref 3.5–5.1)
Sodium: 139 mEq/L (ref 135–145)

## 2020-07-25 LAB — HEPATIC FUNCTION PANEL
ALT: 38 U/L — ABNORMAL HIGH (ref 0–35)
AST: 26 U/L (ref 0–37)
Albumin: 4.7 g/dL (ref 3.5–5.2)
Alkaline Phosphatase: 67 U/L (ref 39–117)
Bilirubin, Direct: 0.1 mg/dL (ref 0.0–0.3)
Total Bilirubin: 0.5 mg/dL (ref 0.2–1.2)
Total Protein: 7.8 g/dL (ref 6.0–8.3)

## 2020-07-25 LAB — CBC WITH DIFFERENTIAL/PLATELET
Basophils Absolute: 0.1 10*3/uL (ref 0.0–0.1)
Basophils Relative: 1.4 % (ref 0.0–3.0)
Eosinophils Absolute: 0.2 10*3/uL (ref 0.0–0.7)
Eosinophils Relative: 2.3 % (ref 0.0–5.0)
HCT: 37.4 % (ref 36.0–46.0)
Hemoglobin: 12.8 g/dL (ref 12.0–15.0)
Lymphocytes Relative: 31.2 % (ref 12.0–46.0)
Lymphs Abs: 2.8 10*3/uL (ref 0.7–4.0)
MCHC: 34.3 g/dL (ref 30.0–36.0)
MCV: 85.4 fl (ref 78.0–100.0)
Monocytes Absolute: 0.6 10*3/uL (ref 0.1–1.0)
Monocytes Relative: 6.9 % (ref 3.0–12.0)
Neutro Abs: 5.2 10*3/uL (ref 1.4–7.7)
Neutrophils Relative %: 58.2 % (ref 43.0–77.0)
Platelets: 284 10*3/uL (ref 150.0–400.0)
RBC: 4.38 Mil/uL (ref 3.87–5.11)
RDW: 14.7 % (ref 11.5–15.5)
WBC: 9 10*3/uL (ref 4.0–10.5)

## 2020-07-25 LAB — BRAIN NATRIURETIC PEPTIDE: Pro B Natriuretic peptide (BNP): 17 pg/mL (ref 0.0–100.0)

## 2020-07-25 LAB — URINALYSIS, ROUTINE W REFLEX MICROSCOPIC
Bilirubin Urine: NEGATIVE
Hgb urine dipstick: NEGATIVE
Ketones, ur: NEGATIVE
Nitrite: POSITIVE — AB
RBC / HPF: NONE SEEN (ref 0–?)
Specific Gravity, Urine: <=1.005 — AB (ref 1.000–1.030)
Total Protein, Urine: NEGATIVE
Urine Glucose: NEGATIVE
Urobilinogen, UA: 0.2 (ref 0.0–1.0)
pH: 5.5 (ref 5.0–8.0)

## 2020-07-25 LAB — VITAMIN B12: Vitamin B-12: 368 pg/mL (ref 211–911)

## 2020-07-25 LAB — VITAMIN D 25 HYDROXY (VIT D DEFICIENCY, FRACTURES): VITD: 80.85 ng/mL (ref 30.00–100.00)

## 2020-07-25 MED ORDER — LEVOFLOXACIN 500 MG PO TABS: 500.0000 mg | ORAL_TABLET | Freq: Every day | ORAL | 0 refills | Status: AC

## 2020-07-25 MED ORDER — ALBUTEROL SULFATE HFA 108 (90 BASE) MCG/ACT IN AERS: 2.0000 | INHALATION_SPRAY | Freq: Four times a day (QID) | RESPIRATORY_TRACT | 5 refills | Status: DC | PRN

## 2020-07-25 NOTE — Progress Notes (Signed)
Patient ID: Donna Holmes, female   DOB: March 31, 1950, 70 y.o.   MRN: [id number]        Chief Complaint: dyspnea and cough       HPI:  Donna Holmes is a 70 y.o. female here with 1-2 wks worsening sob/doe with a kind of tightness in the chest, worse in the AM , makes her kind of breathless during the day and has occasional non prod cough but no fever, and Pt denies other chest pain, orthopnea, PND, increased LE swelling, palpitations, dizziness or syncope.  Pt is nonsmoker   Pt denies polydipsia, polyuria, or new focal neuro s/s.   Pt denies fever, wt loss, night sweats, loss of appetite, or other constitutional symptoms  Also c/o bad odor to urine, but Denies urinary symptoms such as dysuria, frequency, urgency, flank pain, hematuria or n/v, fever, chills.        Wt Readings from Last 3 Encounters:  07/25/20 137 lb (62.1 kg)  05/07/20 136 lb (61.7 kg)  04/15/20 136 lb (61.7 kg)   BP Readings from Last 3 Encounters:  07/25/20 132/70  05/07/20 116/74  04/15/20 110/70         Past Medical History:  Diagnosis Date  . ALLERGIC RHINITIS 05/19/2009  . Anemia, iron deficiency 06/09/2015  . B12 deficiency 08/02/2018  . 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 Complication of anesthesia   . GERD (gastroesophageal reflux disease)   . Headache(784.0) 05/19/2009   Past Surgical History:  Procedure Laterality Date  . ANTERIOR AND POSTERIOR REPAIR WITH SACROSPINOUS FIXATION N/A 05/16/2014   Procedure: ANTERIOR AND POSTERIOR REPAIR WITH SACROSPINOUS LIGAMENT SUSPENSION;  Surgeon: Luz Lex, MD;  Location: [facility];  Service: Gynecology;  Laterality: N/A;  . BARTHOLIN CYST MARSUPIALIZATION    . BUNIONECTOMY Right   . LAPAROSCOPIC ASSISTED VAGINAL HYSTERECTOMY N/A 05/16/2014   Procedure: LAPAROSCOPIC ASSISTED VAGINAL HYSTERECTOMY;  Surgeon: Luz Lex, MD;  Location: [facility];  Service: Gynecology;  Laterality: N/A;  . NEUROMA SURGERY Right    2nd interspace    reports that she has never smoked. She has never  used smokeless tobacco. She reports that she does not drink alcohol and does not use drugs. family history includes Alcohol abuse in her father; Hypertension in her mother; Prostate cancer in her maternal uncle. Allergies  Allergen Reactions  . Codeine Nausea And Vomiting  . Oxycontin [Oxycodone Hcl] Nausea Only and Other (See Comments)    FELT LIKE I WAS GOING TO FAINT  . Statins Other (See Comments)    myalgias  . Ibuprofen Other (See Comments)    Other reaction(s): GI Bleed   Current Outpatient Medications on File Prior to Visit  Medication Sig Dispense Refill  . acetaminophen (TYLENOL) 500 MG tablet Take 500 mg by mouth every 6 (six) hours as needed.    . calcium carbonate (OS-CAL) 600 MG TABS tablet Take 600 mg by mouth daily.    . cetirizine-pseudoephedrine (ZYRTEC-D) 5-120 MG per tablet Take 1 tablet by mouth once as needed for allergies.    . Cholecalciferol (VITAMIN D-3 PO) Take 4,000 Units by mouth daily.    Marland Kitchen CRANBERRY PO Take by mouth.    . estradiol (ESTRACE) 0.1 MG/GM vaginal cream Place 1 Applicatorful vaginally 2 (two) times a week.    . Estradiol-Estriol-Progesterone (BIEST/PROGESTERONE) CREA Apply 2 clicks daily, except on Sunday. 40 g PRN  . Glucosamine-Chondroitin (GLUCOSAMINE CHONDR COMPLEX PO) Take 1 tablet by mouth daily.    . Omega-3 Fatty Acids (FISH  OIL) 1000 MG CAPS Take 3 capsules by mouth daily.     . pantoprazole (PROTONIX) 40 MG tablet TOME UNA TABLETA TODOS LOS DIAS 90 tablet 1  . traZODone (DESYREL) 50 MG tablet TAKE 0.5-1 TABLETS BY MOUTH AT BEDTIME AS NEEDED FOR SLEEP. 90 tablet 1   No current facility-administered medications on file prior to visit.        ROS:  All others reviewed and negative.  Objective        PE:  BP 132/70 (BP Location: Right Arm, Patient Position: Sitting, Cuff Size: Normal)   Pulse 89   Temp 98.5 F (36.9 C) (Oral)   Ht _0  (1.6 m)   Wt 137 lb (62.1 kg)   SpO2 98%   BMI 24.27 kg/m                 Constitutional:  Pt appears in NAD               HENT: Head: NCAT.                Right Ear: External ear normal.                 Left Ear: External ear normal.                Eyes: . Pupils are equal, round, and reactive to light. Conjunctivae and EOM are normal               Nose: without d/c or deformity               Neck: Neck supple. Gross normal ROM               Cardiovascular: Normal rate and regular rhythm.                 Pulmonary/Chest: Effort normal and breath sounds without wheezing but with bibasilar rales.                Abd:  Soft, NT, ND, + BS, no organomegaly               Neurological: Pt is alert. At baseline orientation, motor grossly intact               Skin: Skin is warm. No rashes, no other new lesions, LE edema - none               Psychiatric: Pt behavior is normal without agitation   Micro: none  Cardiac tracings I have personally interpreted today:  none  Pertinent Radiological findings (summarize): none   Lab Results  Component Value Date   WBC 9.0 07/25/2020   HGB 12.8 07/25/2020   HCT 37.4 07/25/2020   PLT 284.0 07/25/2020   GLUCOSE 95 07/25/2020   CHOL 217 (H) 04/15/2020   TRIG 302.0 (H) 04/15/2020   HDL 47.00 04/15/2020   LDLDIRECT 149.0 04/15/2020   LDLCALC 124 (H) 08/01/2018   ALT 38 (H) 07/25/2020   AST 26 07/25/2020   NA 139 07/25/2020   K 3.7 07/25/2020   CL 101 07/25/2020   CREATININE 0.81 07/25/2020   BUN 19 07/25/2020   CO2 28 07/25/2020   TSH 1.40 04/15/2020   HGBA1C 6.2 04/15/2020   Assessment/Plan:  Donna Holmes is a 70 y.o. White or Caucasian [1] female with  has a past medical history of ALLERGIC RHINITIS (05/19/2009), Anemia, iron deficiency (06/09/2015), B12 deficiency (08/02/2018), Chagas' disease (15/17/6160), Complication of anesthesia, GERD (  gastroesophageal reflux disease), and Headache(784.0) (05/19/2009).  Abnormal breath sounds Also for cxr, refer pulmonary  Bad odor of urine Also for urine and culture,  to f/u any worsening  symptoms or concerns  Dyspnea Etiology unclear, cant r/o pna, volume overload vs other, for levaquin course, albuterol hfa prn, labs as ordered,  to f/u any worsening symptoms or concerns  Followup: Return if symptoms worsen or fail to improve.  Cathlean Cower, MD 07/31/2020 6:06 AM [facility]

## 2020-07-25 NOTE — Patient Instructions (Signed)
Please take all new medication as prescribed - the antibiotic, and the inhaler as needed  Please continue all other medications as before, and refills have been done if requested.  Please have the pharmacy call with any other refills you may need.  Please continue your efforts at being more active, low cholesterol diet, and weight control.  Please keep your appointments with your specialists as you may have planned  Please go to the XRAY Department in the first floor for the x-ray testing  Please go to the LAB at the blood drawing area for the tests to be done  You will be contacted by phone if any changes need to be made immediately.  Otherwise, you will receive a letter about your results with an explanation, but please check with MyChart first.  Please remember to sign up for MyChart if you have not done so, as this will be important to you in the future with finding out test results, communicating by private email, and scheduling acute appointments online when needed.

## 2020-07-26 ENCOUNTER — Encounter: Payer: Self-pay | Admitting: Internal Medicine

## 2020-07-28 ENCOUNTER — Encounter: Payer: Self-pay | Admitting: Internal Medicine

## 2020-07-28 LAB — URINE CULTURE

## 2020-07-31 ENCOUNTER — Encounter: Payer: Self-pay | Admitting: Internal Medicine

## 2020-07-31 DIAGNOSIS — R0600 Dyspnea, unspecified: Secondary | ICD-10-CM | POA: Insufficient documentation

## 2020-07-31 NOTE — Assessment & Plan Note (Signed)
Etiology unclear, cant r/o pna, volume overload vs other, for levaquin course, albuterol hfa prn, labs as ordered,  to f/u any worsening symptoms or concerns

## 2020-07-31 NOTE — Assessment & Plan Note (Signed)
Also for cxr, refer pulmonary

## 2020-07-31 NOTE — Assessment & Plan Note (Signed)
Also for urine and culture,  to f/u any worsening symptoms or concerns

## 2020-08-12 DIAGNOSIS — N952 Postmenopausal atrophic vaginitis: Secondary | ICD-10-CM | POA: Diagnosis not present

## 2020-08-12 DIAGNOSIS — N3020 Other chronic cystitis without hematuria: Secondary | ICD-10-CM | POA: Diagnosis not present

## 2020-08-20 DIAGNOSIS — Z961 Presence of intraocular lens: Secondary | ICD-10-CM | POA: Diagnosis not present

## 2020-08-20 DIAGNOSIS — H16223 Keratoconjunctivitis sicca, not specified as Sjogren's, bilateral: Secondary | ICD-10-CM | POA: Diagnosis not present

## 2020-08-20 DIAGNOSIS — H43813 Vitreous degeneration, bilateral: Secondary | ICD-10-CM | POA: Diagnosis not present

## 2020-08-21 ENCOUNTER — Encounter: Payer: Self-pay | Admitting: Internal Medicine

## 2020-08-22 NOTE — Telephone Encounter (Signed)
Left message for patient to call me back to schedule appt

## 2020-08-23 ENCOUNTER — Other Ambulatory Visit: Payer: Self-pay

## 2020-08-23 ENCOUNTER — Ambulatory Visit (HOSPITAL_COMMUNITY)
Admission: EM | Admit: 2020-08-23 | Discharge: 2020-08-23 | Disposition: A | Discharge status: Home / Self Care | Payer: Medicare PPO | Attending: Medical Oncology | Admitting: Medical Oncology

## 2020-08-23 ENCOUNTER — Encounter (HOSPITAL_COMMUNITY): Payer: Self-pay | Admitting: Emergency Medicine

## 2020-08-23 DIAGNOSIS — B0233 Zoster keratitis: Secondary | ICD-10-CM

## 2020-08-23 MED ORDER — VALACYCLOVIR HCL 1 G PO TABS: 1000.0000 mg | ORAL_TABLET | Freq: Three times a day (TID) | ORAL | 0 refills | Status: AC

## 2020-08-23 MED ORDER — PREDNISOLONE ACETATE 1 % OP SUSP: 1.0000 [drp] | Freq: Three times a day (TID) | OPHTHALMIC | 0 refills | Status: DC

## 2020-08-23 NOTE — ED Provider Notes (Signed)
Donna Holmes    CSN: [id number] Arrival date & time: 08/23/20  1125      History   Chief Complaint Chief Complaint  Patient presents with   Herpes Zoster    HPI Donna Holmes is a 70 y.o. female.   HPI  Rash: Patient states that she has had a painful rash of her left face for the past 4 days.  Over the past day her eye has become affected as it is painful and red.  She denies any significant visual changes, headaches, fever, hearing changes.  She has been using a topical hydrocortisone cream for symptoms with little relief.  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   Complication of anesthesia    GERD (gastroesophageal reflux disease)    Headache(784.0) 05/19/2009    Patient Active Problem List   Diagnosis Date Noted   Dyspnea 07/31/2020   Upper back pain 04/22/2020   Abnormal breath sounds 04/15/2020   Recurrent UTI 04/15/2020   Statin myopathy 04/15/2020   Low back pain 01/16/2020   Bad odor of urine 03/19/2019   B12 deficiency 08/02/2018   Hyperglycemia 07/31/2018   Symptomatic menopausal or female climacteric states 08/16/2017   Syncope 06/09/2015   Dysuria 06/09/2015   Anemia, iron deficiency 06/09/2015   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/16/2014   Encounter for well adult exam with abnormal findings 01/29/2014   Hyperlipidemia 01/29/2014   Drug rash 04/26/2011   Chest tightness 01/06/2011   Diarrhea 01/06/2011   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   Fatigue 12/29/2010   CAROTID ARTERY DISEASE 01/27/2010   Allergic rhinitis 05/19/2009   Headache(784.0) 05/19/2009    Past Surgical History:  Procedure Laterality Date   ANTERIOR AND POSTERIOR REPAIR WITH SACROSPINOUS FIXATION N/A 05/16/2014   Procedure: ANTERIOR AND POSTERIOR REPAIR WITH SACROSPINOUS LIGAMENT SUSPENSION;  Surgeon: Luz Lex, MD;  Location:  [facility];  Service: Gynecology;  Laterality: N/A;   BARTHOLIN CYST MARSUPIALIZATION     BUNIONECTOMY Right    LAPAROSCOPIC ASSISTED VAGINAL HYSTERECTOMY N/A 05/16/2014   Procedure: LAPAROSCOPIC ASSISTED VAGINAL HYSTERECTOMY;  Surgeon: Luz Lex, MD;  Location: [facility];  Service: Gynecology;  Laterality: N/A;   NEUROMA SURGERY Right    2nd interspace    OB History   No obstetric history on file.      Home Medications    Prior to Admission medications   Medication Sig Start Date End Date Taking? Authorizing Provider  acetaminophen (TYLENOL) 500 MG tablet Take 500 mg by mouth every 6 (six) hours as needed.    [provider]  albuterol (VENTOLIN HFA) 108 (90 Base) MCG/ACT inhaler Inhale 2 puffs into the lungs every 6 (six) hours as needed for wheezing or shortness of breath. 07/25/20   Biagio Borg, MD  calcium carbonate (OS-CAL) 600 MG TABS tablet Take 600 mg by mouth daily.    [provider]  cetirizine-pseudoephedrine (ZYRTEC-D) 5-120 MG per tablet Take 1 tablet by mouth once as needed for allergies.    [provider]  Cholecalciferol (VITAMIN D-3 PO) Take 4,000 Units by mouth daily.    [provider]  CRANBERRY PO Take by mouth.    [provider]  estradiol (ESTRACE) 0.1 MG/GM vaginal cream Place 1 Applicatorful vaginally 2 (two) times a week.    [provider]  Estradiol-Estriol-Progesterone (BIEST/PROGESTERONE) CREA Apply 2 clicks daily, except  on Sunday. 03/07/19   Glendale Chard, MD  Glucosamine-Chondroitin (GLUCOSAMINE CHONDR COMPLEX PO) Take 1 tablet by mouth daily.    [provider]  Omega-3 Fatty Acids (FISH OIL) 1000 MG CAPS Take 3 capsules by mouth daily.     [provider]  pantoprazole (PROTONIX) 40 MG tablet TOME UNA TABLETA TODOS LOS DIAS 07/15/20   Biagio Borg, MD  traZODone (DESYREL) 50 MG tablet TAKE 0.5-1 TABLETS BY MOUTH AT BEDTIME AS NEEDED FOR SLEEP. 06/06/20   Biagio Borg, MD     Family History Family History  Problem Relation Age of Onset   Alcohol abuse Father    Hypertension Mother    Prostate cancer Maternal Uncle     Social History Social History   Tobacco Use   Smoking status: Never   Smokeless tobacco: Never  Vaping Use   Vaping Use: Never used  Substance Use Topics   Alcohol use: No   Drug use: No   Allergies   Codeine, Oxycontin [oxycodone hcl], Statins, and Ibuprofen   Review of Systems Review of Systems  As stated above in HPI Physical Exam Triage Vital Signs ED Triage Vitals  Enc Vitals Group     BP 08/23/20 1210 127/88     Pulse Rate 08/23/20 1210 97     Resp 08/23/20 1210 16     Temp 08/23/20 1210 98 F (36.7 C)     Temp Source 08/23/20 1210 Oral     SpO2 08/23/20 1210 98 %     Weight --      Height --      Head Circumference --      Peak Flow --      Pain Score 08/23/20 1208 4     Pain Loc --      Pain Edu? --      Excl. in [place]? --    No data found.  Updated Vital Signs BP 127/88 (BP Location: Left Arm)   Pulse 97   Temp 98 F (36.7 C) (Oral)   Resp 16   SpO2 98%   Visual Acuity Right Eye Distance:   Left Eye Distance:   Bilateral Distance:    Right Eye Near:   Left Eye Near:    Bilateral Near:     Physical Exam    UC Treatments / Results  Labs (all labs ordered are listed, but only abnormal results are displayed) Labs Reviewed - No data to display  EKG   Radiology No results found.  Procedures Procedures (including critical care time)  Medications Ordered in UC Medications - No data to display  Initial Impression / Assessment and Plan / UC Course  I have reviewed the triage vital signs and the nursing notes.  Pertinent labs & imaging results that were available during my care of the patient were reviewed by me and considered in my medical decision making (see chart for details).     New.  This is herpes zoster affecting the eye.  Contacted the on-call specialist for  ophthalmology who is Dr. Olene Craven. Per his recommendation we are going to start her on Valtrex and prednisolone eyedrops.  We confirm the dosage.  She will need to contact them on Monday for follow-up in office.  Should symptoms worsen she will go to the emergency room. Final Clinical Impressions(s) / UC Diagnoses   Final diagnoses:  None   Discharge Instructions   None    ED Prescriptions   None  PDMP not reviewed this encounter.   Hughie Closs, [place] 08/23/20 1257

## 2020-08-23 NOTE — ED Triage Notes (Addendum)
Pt presents with shingles on left side of face xs 4 day. Denies visual changes, but states eye has been red.

## 2020-08-24 ENCOUNTER — Encounter: Payer: Self-pay | Admitting: Internal Medicine

## 2020-08-25 ENCOUNTER — Telehealth: Payer: Self-pay | Admitting: Internal Medicine

## 2020-08-25 DIAGNOSIS — B029 Zoster without complications: Secondary | ICD-10-CM | POA: Diagnosis not present

## 2020-08-25 NOTE — Telephone Encounter (Signed)
[facility] FYI- 6.11.22  ---Caller's wife may have shingles painful blistering bands) on her face; swollen lymph nodes it is on her eyelid Since Tuesday. Denies any current symptoms  Advised to see PCP within 4 hours

## 2020-08-26 ENCOUNTER — Ambulatory Visit: Payer: Medicare PPO | Admitting: Internal Medicine

## 2020-09-01 DIAGNOSIS — B029 Zoster without complications: Secondary | ICD-10-CM | POA: Diagnosis not present

## 2020-09-18 ENCOUNTER — Other Ambulatory Visit: Payer: Self-pay

## 2020-09-18 ENCOUNTER — Encounter: Payer: Self-pay | Admitting: Pulmonary Disease

## 2020-09-18 ENCOUNTER — Ambulatory Visit: Payer: Medicare PPO | Admitting: Pulmonary Disease

## 2020-09-18 VITALS — BP 118/76 | HR 94 | Ht 63.0 in | Wt 135.0 lb

## 2020-09-18 DIAGNOSIS — R059 Cough, unspecified: Secondary | ICD-10-CM | POA: Diagnosis not present

## 2020-09-18 DIAGNOSIS — J849 Interstitial pulmonary disease, unspecified: Secondary | ICD-10-CM | POA: Diagnosis not present

## 2020-09-18 DIAGNOSIS — R0602 Shortness of breath: Secondary | ICD-10-CM | POA: Diagnosis not present

## 2020-09-18 MED ORDER — FLUTICASONE FUROATE-VILANTEROL 100-25 MCG/INH IN AEPB: 1.0000 | INHALATION_SPRAY | Freq: Every day | RESPIRATORY_TRACT | 3 refills | Status: DC

## 2020-09-18 MED ORDER — FLUTICASONE FUROATE-VILANTEROL 100-25 MCG/INH IN AEPB: 1.0000 | INHALATION_SPRAY | Freq: Every day | RESPIRATORY_TRACT | 0 refills | Status: DC

## 2020-09-18 NOTE — Patient Instructions (Addendum)
We will check pulmonary function test and a high-resolution CT chest scan for concern of interstitial lung disease.  We will start you on Breo Ellipta 1 puff daily and monitor for improvement in your shortness of breath and cough. -Rinse mouth out after each use  Continue pantoprazole 40 mg daily for GERD and I recommend elevating the head of the bed to reduce nighttime reflux symptoms.  This can be done with a wedge pillow or using blocks to elevate the headboard of the bed.  We will give you a call once we have the CT chest and pulmonary function test results and consider performing blood test to evaluate for inflammatory lung conditions.

## 2020-09-18 NOTE — Progress Notes (Signed)
Synopsis: Referred in July 202 for shortness of breath by Cathlean Cower, MD  Subjective:   PATIENT ID: Donna Holmes GENDER: female DOB: 12/05/1950, MRN: [id number]  HPI  Chief Complaint  Patient presents with   Consult    Referred by PCP for dyspnea. States this has been going on for the past 6 months. Notices the SOB more so with exertion. Will occasionally wake up at night feeling SOB. Has a dry cough. Denies any chest pain.    Donna Holmes is a 70 year old woman, never smoker who is referred to pulmonary clinic for evaluation of shortness of breath.   She has noticed shortness of breath for a few months with some wheezing, dry cough and chest tightness. The dyspnea is most notable with exertion. She denies any recent infections and has not had covid. She denies any respiratory issues in childhood.   She does report reflux symptoms and has been taking pantoprazole 30m daily. She denies any sinus congestion and post-nasal drainage. She denies any muscle aches, morning stiffness of skin rashes.  Past Medical History:  Diagnosis Date   ALLERGIC RHINITIS 05/19/2009   Anemia, iron deficiency 06/09/2015   B12 deficiency 08/02/2018   Chagas' disease 137/36/6815  Complication of anesthesia    GERD (gastroesophageal reflux disease)    Headache(784.0) 05/19/2009     Family History  Problem Relation Age of Onset   Alcohol abuse Father    Hypertension Mother    Prostate cancer Maternal Uncle      Social History   Socioeconomic History   Marital status: Married    Spouse name: Not on file   Number of children: 1   Years of education: Not on file   Highest education level: Not on file  Occupational History   Occupation: nurse retired  Tobacco Use   Smoking status: Never   Smokeless tobacco: Never  Vaping Use   Vaping Use: Never used  Substance and Sexual Activity   Alcohol use: No   Drug use: No   Sexual activity: Not Currently  Other Topics Concern   Not on file  Social  History Narrative   Not on file   Social Determinants of Health   Financial Resource Strain: Not on file  Food Insecurity: Not on file  Transportation Needs: Not on file  Physical Activity: Not on file  Stress: Not on file  Social Connections: Not on file  Intimate Partner Violence: Not on file     Allergies  Allergen Reactions   Codeine Nausea And Vomiting   Oxycontin [Oxycodone Hcl] Nausea Only and Other (See Comments)    FELT LIKE I WAS GOING TO FAINT   Statins Other (See Comments)    myalgias   Ibuprofen Other (See Comments)    Other reaction(s): GI Bleed     Outpatient Medications Prior to Visit  Medication Sig Dispense Refill   acetaminophen (TYLENOL) 500 MG tablet Take 500 mg by mouth every 6 (six) hours as needed.     albuterol (VENTOLIN HFA) 108 (90 Base) MCG/ACT inhaler Inhale 2 puffs into the lungs every 6 (six) hours as needed for wheezing or shortness of breath. 8 g 5   calcium carbonate (OS-CAL) 600 MG TABS tablet Take 600 mg by mouth daily.     cetirizine-pseudoephedrine (ZYRTEC-D) 5-120 MG per tablet Take 1 tablet by mouth once as needed for allergies.     Cholecalciferol (VITAMIN D-3 PO) Take 4,000 Units by mouth daily.  CRANBERRY PO Take by mouth.     estradiol (ESTRACE) 0.1 MG/GM vaginal cream Place 1 Applicatorful vaginally 2 (two) times a week.     Estradiol-Estriol-Progesterone (BIEST/PROGESTERONE) CREA Apply 2 clicks daily, except on Sunday. 40 g PRN   Glucosamine-Chondroitin (GLUCOSAMINE CHONDR COMPLEX PO) Take 1 tablet by mouth daily.     Omega-3 Fatty Acids (FISH OIL) 1000 MG CAPS Take 3 capsules by mouth daily.      pantoprazole (PROTONIX) 40 MG tablet TOME UNA TABLETA TODOS LOS DIAS 90 tablet 1   traZODone (DESYREL) 50 MG tablet TAKE 0.5-1 TABLETS BY MOUTH AT BEDTIME AS NEEDED FOR SLEEP. 90 tablet 1   prednisoLONE acetate (PRED FORTE) 1 % ophthalmic suspension Place 1 drop into the left eye 3 (three) times daily. 5 mL 0   No  facility-administered medications prior to visit.    Review of Systems  Constitutional:  Negative for chills, fever, malaise/fatigue and weight loss.  HENT:  Negative for congestion, sinus pain and sore throat.   Eyes: Negative.   Respiratory:  Negative for cough, hemoptysis, sputum production, shortness of breath and wheezing.   Cardiovascular:  Negative for chest pain, palpitations, orthopnea, claudication and leg swelling.  Gastrointestinal:  Negative for abdominal pain, heartburn, nausea and vomiting.  Genitourinary: Negative.   Musculoskeletal:  Negative for joint pain and myalgias.  Skin:  Negative for rash.  Neurological:  Negative for weakness.  Endo/Heme/Allergies: Negative.   Psychiatric/Behavioral: Negative.      Objective:   Vitals:   09/18/20 1533  BP: 118/76  Pulse: 94  SpO2: 99%  Weight: 135 lb (61.2 kg)  Height: _0  (1.6 m)    Physical Exam Constitutional:      General: She is not in acute distress.    Appearance: She is not ill-appearing.  HENT:     Head: Normocephalic and atraumatic.  Eyes:     General: No scleral icterus.    Conjunctiva/sclera: Conjunctivae normal.     Pupils: Pupils are equal, round, and reactive to light.  Cardiovascular:     Rate and Rhythm: Normal rate and regular rhythm.     Pulses: Normal pulses.     Heart sounds: Normal heart sounds. No murmur heard. Pulmonary:     Effort: Pulmonary effort is normal.     Breath sounds: Rales present. No wheezing or rhonchi.  Abdominal:     General: Bowel sounds are normal.     Palpations: Abdomen is soft.  Musculoskeletal:     Right lower leg: No edema.     Left lower leg: No edema.  Lymphadenopathy:     Cervical: No cervical adenopathy.  Skin:    General: Skin is warm and dry.  Neurological:     General: No focal deficit present.     Mental Status: She is alert.  Psychiatric:        Mood and Affect: Mood normal.        Behavior: Behavior normal.        Thought Content: Thought  content normal.        Judgment: Judgment normal.   CBC    Component Value Date/Time   WBC 9.0 07/25/2020 1345   RBC 4.38 07/25/2020 1345   HGB 12.8 07/25/2020 1345   HGB 12.8 12/16/2017 1249   HCT 37.4 07/25/2020 1345   HCT 38.4 12/16/2017 1249   PLT 284.0 07/25/2020 1345   PLT 307 12/16/2017 1249   MCV 85.4 07/25/2020 1345   MCV 88 12/16/2017 1249  MCH 29.3 12/16/2017 1249   MCH 29.9 05/17/2014 0529   MCHC 34.3 07/25/2020 1345   RDW 14.7 07/25/2020 1345   RDW 13.7 12/16/2017 1249   LYMPHSABS 2.8 07/25/2020 1345   MONOABS 0.6 07/25/2020 1345   EOSABS 0.2 07/25/2020 1345   BASOSABS 0.1 07/25/2020 1345   BMP Latest Ref Rng & Units 07/25/2020 04/15/2020 03/19/2019  Glucose 70 - 99 mg/dL 95 88 97  BUN 6 - 23 mg/dL _0 Creatinine 0.40 - 1.20 mg/dL 0.81 0.70 0.77  Sodium 135 - 145 mEq/L 139 138 140  Potassium 3.5 - 5.1 mEq/L 3.7 4.3 4.0  Chloride 96 - 112 mEq/L 101 104 102  CO2 19 - 32 mEq/L _1 Calcium 8.4 - 10.5 mg/dL 9.9 10.0 10.2   Chest imaging: HRCT Chest 10/03/20 Moderate patchy confluent subpleural reticulation and ground-glass opacity throughout both lungs with associated mild-to-moderate traction bronchiectasis and mild architectural distortion. There is a basilar predominance to these findings. No frank honeycombing. Findings are categorized as probable UIP per consensus guidelines.  CXR 07/25/20 Heart size and mediastinal contours are within normal limits. Study is hypoinspiratory with crowding of the perihilar and bibasilar bronchovascular markings. Given the low lung volumes, lungs are clear, possible mild atelectasis at the LEFT lung base. No pleural effusion or pneumothorax. Osseous structures about the chest are unremarkable.  PFT: PFT Results Latest Ref Rng & Units 09/23/2020  FVC-Pre L 1.82  FVC-Predicted Pre % 64  FVC-Post L 1.81  FVC-Predicted Post % 64  Pre FEV1/FVC % % 81  Post FEV1/FCV % % 82  FEV1-Pre L 1.47  FEV1-Predicted Pre %  69  FEV1-Post L 1.49  DLCO uncorrected ml/min/mmHg 13.70  DLCO UNC% % 73  DLVA Predicted % 104  TLC L 2.88  TLC % Predicted % 58  RV % Predicted % 46  PFT 2022 - Moderate restriction and minimal diffusion defect  Echo 2012: LV EF 55-60%. Grade I diastolic dysfunction.    Assessment & Plan:   ILD (interstitial lung disease) ([place]) - Plan: ANA, ANCA screen with reflex titer, Anti-DNA antibody, double-stranded, Anti-Smith antibody, Centromere Antibodies, Cyclic citrul peptide antibody, IgG, Sjogren's syndrome antibods(ssa + ssb), Rheumatoid factor  Cough  Shortness of breath - Plan: CT Chest High Resolution, Pulmonary Function Test, fluticasone furoate-vilanterol (BREO ELLIPTA) 100-25 MCG/INH AEPB  Discussion: Donna Holmes is a 70 year old woman, never smoker who is referred to pulmonary clinic for evaluation of shortness of breath.   She has moderate restrictive defect and minimal diffusion defect on pulmonary function tests. The HRCT chest shows moderate patchy confluent subpleural reticulation and ground glass opacitiy throughout both lungs with mild to moderate traction bronchiectasis with basilar predominance. This radiograph pattern fits probable UIP pattern without frank honeycombing.   We will discuss with the patient about considering anti-fibrotic therapy. We will check basic serologies to rule out other inflammatory conditions.   She has been started on breo ellipta 1 puff daily and will monitor for symptom improvement of her cough.   She is to continue PPI therapy for her GERD.   Follow up in 2 months.   Freda Jackson, MD [facility] Office: [phone]   Current Outpatient Medications:    acetaminophen (TYLENOL) 500 MG tablet, Take 500 mg by mouth every 6 (six) hours as needed., Disp: , Rfl:    albuterol (VENTOLIN HFA) 108 (90 Base) MCG/ACT inhaler, Inhale 2 puffs into the lungs every 6 (six) hours as needed for  wheezing or shortness of  breath., Disp: 8 g, Rfl: 5   calcium carbonate (OS-CAL) 600 MG TABS tablet, Take 600 mg by mouth daily., Disp: , Rfl:    cetirizine-pseudoephedrine (ZYRTEC-D) 5-120 MG per tablet, Take 1 tablet by mouth once as needed for allergies., Disp: , Rfl:    Cholecalciferol (VITAMIN D-3 PO), Take 4,000 Units by mouth daily., Disp: , Rfl:    CRANBERRY PO, Take by mouth., Disp: , Rfl:    estradiol (ESTRACE) 0.1 MG/GM vaginal cream, Place 1 Applicatorful vaginally 2 (two) times a week., Disp: , Rfl:    Estradiol-Estriol-Progesterone (BIEST/PROGESTERONE) CREA, Apply 2 clicks daily, except on Sunday., Disp: 40 g, Rfl: PRN   fluticasone furoate-vilanterol (BREO ELLIPTA) 100-25 MCG/INH AEPB, Inhale 1 puff into the lungs daily., Disp: 60 each, Rfl: 3   fluticasone furoate-vilanterol (BREO ELLIPTA) 100-25 MCG/INH AEPB, Inhale 1 puff into the lungs daily in the afternoon., Disp: 1 each, Rfl: 0   Glucosamine-Chondroitin (GLUCOSAMINE CHONDR COMPLEX PO), Take 1 tablet by mouth daily., Disp: , Rfl:    Omega-3 Fatty Acids (FISH OIL) 1000 MG CAPS, Take 3 capsules by mouth daily. , Disp: , Rfl:    pantoprazole (PROTONIX) 40 MG tablet, TOME UNA TABLETA TODOS LOS DIAS, Disp: 90 tablet, Rfl: 1   traZODone (DESYREL) 50 MG tablet, TAKE 0.5-1 TABLETS BY MOUTH AT BEDTIME AS NEEDED FOR SLEEP., Disp: 90 tablet, Rfl: 1

## 2020-09-19 DIAGNOSIS — B029 Zoster without complications: Secondary | ICD-10-CM | POA: Diagnosis not present

## 2020-09-23 ENCOUNTER — Ambulatory Visit (INDEPENDENT_AMBULATORY_CARE_PROVIDER_SITE_OTHER): Payer: Medicare PPO | Admitting: Pulmonary Disease

## 2020-09-23 ENCOUNTER — Other Ambulatory Visit: Payer: Self-pay

## 2020-09-23 DIAGNOSIS — R0602 Shortness of breath: Secondary | ICD-10-CM | POA: Diagnosis not present

## 2020-09-23 LAB — PULMONARY FUNCTION TEST
DL/VA % pred: 104 %
DL/VA: 4.35 ml/min/mmHg/L
DLCO unc % pred: 73 %
DLCO unc: 13.7 ml/min/mmHg
FEF 25-75 Post: 2.32 L/sec
FEF 25-75 Pre: 2.09 L/sec
FEF2575-%Change-Post: 11 %
FEF2575-%Pred-Post: 132 %
FEF2575-%Pred-Pre: 119 %
FEV1-%Change-Post: 1 %
FEV1-%Pred-Post: 70 %
FEV1-%Pred-Pre: 69 %
FEV1-Post: 1.49 L
FEV1-Pre: 1.47 L
FEV1FVC-%Change-Post: 1 %
FEV1FVC-%Pred-Pre: 106 %
FEV6-%Change-Post: 0 %
FEV6-%Pred-Post: 67 %
FEV6-%Pred-Pre: 68 %
FEV6-Post: 1.81 L
FEV6-Pre: 1.82 L
FEV6FVC-%Change-Post: 0 %
FEV6FVC-%Pred-Post: 104 %
FEV6FVC-%Pred-Pre: 104 %
FVC-%Change-Post: 0 %
FVC-%Pred-Post: 64 %
FVC-%Pred-Pre: 64 %
FVC-Post: 1.81 L
FVC-Pre: 1.82 L
Post FEV1/FVC ratio: 82 %
Post FEV6/FVC ratio: 100 %
Pre FEV1/FVC ratio: 81 %
Pre FEV6/FVC Ratio: 100 %
RV % pred: 46 %
RV: 1.02 L
TLC % pred: 58 %
TLC: 2.88 L

## 2020-09-23 NOTE — Progress Notes (Signed)
PFT done today. 

## 2020-10-03 ENCOUNTER — Ambulatory Visit (INDEPENDENT_AMBULATORY_CARE_PROVIDER_SITE_OTHER)
Admission: RE | Admit: 2020-10-03 | Discharge: 2020-10-03 | Disposition: A | Discharge status: Home / Self Care | Payer: Medicare PPO | Source: Ambulatory Visit | Attending: Pulmonary Disease | Admitting: Pulmonary Disease

## 2020-10-03 ENCOUNTER — Other Ambulatory Visit: Payer: Self-pay

## 2020-10-03 DIAGNOSIS — R0602 Shortness of breath: Secondary | ICD-10-CM | POA: Diagnosis not present

## 2020-10-03 DIAGNOSIS — R0600 Dyspnea, unspecified: Secondary | ICD-10-CM | POA: Diagnosis not present

## 2020-10-03 DIAGNOSIS — J479 Bronchiectasis, uncomplicated: Secondary | ICD-10-CM | POA: Diagnosis not present

## 2020-10-03 DIAGNOSIS — I700 Atherosclerosis of aorta: Secondary | ICD-10-CM | POA: Diagnosis not present

## 2020-10-08 ENCOUNTER — Telehealth: Payer: Self-pay | Admitting: Pulmonary Disease

## 2020-10-08 NOTE — Telephone Encounter (Signed)
I have informed the patient of her pulmonary function test and high-resolution CT scan results which are concerning for idiopathic pulmonary fibrosis.  I have placed lab orders to rule out potential inflammatory causes of interstitial lung disease that she can come in at her convenience to have drawn.  I believe I was told that her phlebotomist would not be in today so if you could please let the patient know when would be a good time to come in and have these labs drawn I would appreciate it thank you.  Wille Glaser

## 2020-10-08 NOTE — Telephone Encounter (Signed)
Called and spoke with pt and she is aware that lab should be here tomorrow but she will call prior to coming to make sure.

## 2020-10-09 ENCOUNTER — Other Ambulatory Visit: Payer: Medicare PPO

## 2020-10-09 DIAGNOSIS — J849 Interstitial pulmonary disease, unspecified: Secondary | ICD-10-CM | POA: Diagnosis not present

## 2020-10-13 LAB — SJOGREN'S SYNDROME ANTIBODS(SSA + SSB)
SSA (Ro) (ENA) Antibody, IgG: <1 AI
SSB (La) (ENA) Antibody, IgG: <1 AI

## 2020-10-13 LAB — ANTI-SMITH ANTIBODY: ENA SM Ab Ser-aCnc: <1 AI

## 2020-10-13 LAB — RHEUMATOID FACTOR: Rheumatoid fact SerPl-aCnc: <14 IU/mL (ref <14)

## 2020-10-13 LAB — ANTI-DNA ANTIBODY, DOUBLE-STRANDED: ds DNA Ab: <1 IU/mL

## 2020-10-13 LAB — ANA: Anti Nuclear Antibody (ANA): NEGATIVE

## 2020-10-13 LAB — ANCA SCREEN W REFLEX TITER: ANCA Screen: NEGATIVE

## 2020-10-13 LAB — CENTROMERE ANTIBODIES: Centromere Ab Screen: <1 AI

## 2020-10-13 LAB — CYCLIC CITRUL PEPTIDE ANTIBODY, IGG: Cyclic Citrullin Peptide Ab: <16 UNITS

## 2020-10-15 NOTE — Telephone Encounter (Signed)
Hi Dr. Erin Fulling! Please advise on mychart message, thanks!  Dr Erin Fulling.  I read the lab results, which it looks like they came back negative. I would like to know what does negative results mean for my treatment and when I expect to start. Thanks  [place].

## 2020-10-16 ENCOUNTER — Encounter: Payer: Self-pay | Admitting: Internal Medicine

## 2020-10-16 ENCOUNTER — Ambulatory Visit: Payer: Medicare PPO | Admitting: Internal Medicine

## 2020-10-16 ENCOUNTER — Other Ambulatory Visit: Payer: Self-pay

## 2020-10-16 ENCOUNTER — Ambulatory Visit: Payer: Medicare PPO | Admitting: Pharmacist

## 2020-10-16 ENCOUNTER — Other Ambulatory Visit (INDEPENDENT_AMBULATORY_CARE_PROVIDER_SITE_OTHER): Payer: Medicare PPO

## 2020-10-16 ENCOUNTER — Other Ambulatory Visit: Payer: Self-pay | Admitting: Internal Medicine

## 2020-10-16 VITALS — BP 110/64 | HR 99 | Temp 99.0°F | Ht 63.0 in | Wt 136.0 lb

## 2020-10-16 DIAGNOSIS — J84112 Idiopathic pulmonary fibrosis: Secondary | ICD-10-CM | POA: Diagnosis not present

## 2020-10-16 DIAGNOSIS — R739 Hyperglycemia, unspecified: Secondary | ICD-10-CM | POA: Diagnosis not present

## 2020-10-16 DIAGNOSIS — R8290 Unspecified abnormal findings in urine: Secondary | ICD-10-CM | POA: Diagnosis not present

## 2020-10-16 DIAGNOSIS — Z5181 Encounter for therapeutic drug level monitoring: Secondary | ICD-10-CM | POA: Diagnosis not present

## 2020-10-16 DIAGNOSIS — Z7189 Other specified counseling: Secondary | ICD-10-CM

## 2020-10-16 LAB — URINALYSIS, ROUTINE W REFLEX MICROSCOPIC
Bilirubin Urine: NEGATIVE
Hgb urine dipstick: NEGATIVE
Ketones, ur: NEGATIVE
Nitrite: POSITIVE — AB
RBC / HPF: NONE SEEN (ref 0–?)
Specific Gravity, Urine: 1.015 (ref 1.000–1.030)
Total Protein, Urine: NEGATIVE
Urine Glucose: NEGATIVE
Urobilinogen, UA: 0.2 (ref 0.0–1.0)
pH: 6 (ref 5.0–8.0)

## 2020-10-16 LAB — HEPATIC FUNCTION PANEL
ALT: 35 U/L (ref 0–35)
AST: 24 U/L (ref 0–37)
Albumin: 4.4 g/dL (ref 3.5–5.2)
Alkaline Phosphatase: 58 U/L (ref 39–117)
Bilirubin, Direct: 0.1 mg/dL (ref 0.0–0.3)
Total Bilirubin: 0.5 mg/dL (ref 0.2–1.2)
Total Protein: 7.5 g/dL (ref 6.0–8.3)

## 2020-10-16 MED ORDER — CIPROFLOXACIN HCL 500 MG PO TABS: 500.0000 mg | ORAL_TABLET | Freq: Two times a day (BID) | ORAL | 0 refills | Status: AC

## 2020-10-16 NOTE — Patient Instructions (Signed)
Please continue all other medications as before, and refills have been done if requested.  Please have the pharmacy call with any other refills you may need.  Please continue your efforts at being more active, low cholesterol diet, and weight control.  Please keep your appointments with your specialists as you may have planned  Please go to the LAB at the blood drawing area for the tests to be done - just the urine testing today  You will be contacted by phone if any changes need to be made immediately.  Otherwise, you will receive a letter about your results with an explanation, but please check with MyChart first.  Please remember to sign up for MyChart if you have not done so, as this will be important to you in the future with finding out test results, communicating by private email, and scheduling acute appointments online when needed.  Please make an Appointment to return in 6 months, or sooner if needed

## 2020-10-16 NOTE — Patient Instructions (Addendum)
After review of both Ofev and Esbriet today, you chose to pursue trial of of Esbriet. Pharmacy team will investigate coverage of Esbriet  As we receive updates or if we need additional information from you, the pharmacy team will reach out.  Please know this approval process may take several weeks.  Please ensure you are up to date on all of your vaccines, specifically zoster (shingles) vaccine.   Pirfenidone capsules or tablets What is this medication? PIRFENIDONE (peer Courtland nih done) is used to treat idiopathic pulmonary fibrosis. This medicine may be used for other purposes; ask your health care provider orpharmacist if you have questions. COMMON BRAND NAME(S): ESBRIET What should I tell my care team before I take this medication? They need to know if you have any of these conditions: kidney disease liver disease smoke tobacco an unusual or allergic reaction to pirfenidone, or other medicines, foods, dyes, or preservatives pregnant or trying to get pregnant breast-feeding How should I use this medication? Take this medicine by mouth with a glass of water. Follow the directions on the prescription label. Take this medicine with food. Take your medicine at regular intervals. Do not take it more often than directed. Do not stop taking excepton your doctor's advice. Talk to your pediatrician regarding the use of the medicine in children.Special care may be needed. Overdosage: If you think you have taken too much of this medicine contact apoison control center or emergency room at once. NOTE: This medicine is only for you. Do not share this medicine with others. What if I miss a dose? If you miss a dose, take it as soon as you can. If it is almost time for your next dose, take only that dose. Do not take double or extra doses. If you miss 14 or more days of taking the medicine, call your healthcare provider beforestarting again. What may interact with this medication? This medicine may  interact with the following medications: certain antiinfectives like ciprofloxacin, thiabendazole certain medicines for asthma like zafirlukast, zileuton, montelukast certain medicines for depression, anxiety, or psychotic disturbances certain medicines for irregular heart beat like amiodarone, mexiletine certain medicines for seizures like carbamazepine, phenytoin, fosphenytoin disulfiram stomach acid blockers like cimetidine vemurafenib This list may not describe all possible interactions. Give your health care provider a list of all the medicines, herbs, non-prescription drugs, or dietary supplements you use. Also tell them if you smoke, drink alcohol, or use illegaldrugs. Some items may interact with your medicine. What should I watch for while using this medication? Tell your doctor or healthcare professional if your symptoms do not start toget better or if they get worse. If you smoke, tell your doctor if you notice this medicine is not working well for you. Talk to your doctor if you are a smoker or if you decide to stopsmoking. This medicine can make you more sensitive to the sun. Keep out of the sun. If you cannot avoid being in the sun, wear protective clothing and use sunscreen.Do not use sun lamps or tanning beds/booths. What side effects may I notice from receiving this medication? Side effects that you should report to your doctor or health care professionalas soon as possible: allergic reactions like skin rash, itching or hives, swelling of the face, lips, or tongue signs and symptoms of liver injury like dark yellow or brown urine; general ill feeling or flu-like symptoms; light colored stools; loss of appetite; nausea; right upper belly pain; unusually weak or tired; yellowing of the eyes or skin  sunburn Side effects that usually do not require medical attention (report to yourdoctor or health care professional if they continue or are bothersome): changes in  taste diarrhea dizziness headache joint pain nausea; vomiting stomach pain trouble sleeping weak or tired weight loss This list may not describe all possible side effects. Call your doctor for medical advice about side effects. You may report side effects to FDA at1-800-FDA-1088. Where should I keep my medication? Keep out of the reach of children. Store at room temperature between 15 and 30 degrees C (59 and 86 degrees F).Throw away any unused medicine after the expiration date. NOTE: This sheet is a summary. It may not cover all possible information. If you have questions about this medicine, talk to your doctor, pharmacist, orhealth care provider.  2022 Elsevier/Gold Standard (2015-07-02 13:06:09)

## 2020-10-16 NOTE — Progress Notes (Signed)
Patient ID: Donna Holmes, female   DOB: 1950/05/23, 70 y.o.   MRN: [id number]        Chief Complaint: follow up urinary frequency and bad odor       HPI:  Donna Holmes is a 70 y.o. female here with c/o 2 days osnet urinary frequency and urgency and bad odor with mild lower back discomfort, but Denies urinary symptoms such as dysuria, flank pain, hematuria or n/v, fever, chills.  Pt denies chest pain, increased sob or doe, wheezing, orthopnea, PND, increased LE swelling, palpitations, dizziness or syncope.   Pt denies polydipsia, polyuria, or new focal neuro s/s.       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   Complication of anesthesia    GERD (gastroesophageal reflux disease)    Headache(784.0) 05/19/2009   Past Surgical History:  Procedure Laterality Date   ANTERIOR AND POSTERIOR REPAIR WITH SACROSPINOUS FIXATION N/A 05/16/2014   Procedure: ANTERIOR AND POSTERIOR REPAIR WITH SACROSPINOUS LIGAMENT SUSPENSION;  Surgeon: Luz Lex, MD;  Location: [facility];  Service: Gynecology;  Laterality: N/A;   BARTHOLIN CYST MARSUPIALIZATION     BUNIONECTOMY Right    LAPAROSCOPIC ASSISTED VAGINAL HYSTERECTOMY N/A 05/16/2014   Procedure: LAPAROSCOPIC ASSISTED VAGINAL HYSTERECTOMY;  Surgeon: Luz Lex, MD;  Location: [facility];  Service: Gynecology;  Laterality: N/A;   NEUROMA SURGERY Right    2nd interspace    reports that she has never smoked. She has never used smokeless tobacco. She reports that she does not drink alcohol and does not use drugs. family history includes Alcohol abuse in her father; Hypertension in her mother; Prostate  cancer in her maternal uncle. Allergies  Allergen Reactions   Codeine Nausea And Vomiting   Oxycontin [Oxycodone Hcl] Nausea Only and Other (See Comments)    FELT LIKE I WAS GOING TO FAINT   Statins Other (See Comments)    myalgias   Ibuprofen Other (See Comments)    Other reaction(s): GI Bleed   Current Outpatient Medications on File Prior to Visit  Medication Sig Dispense Refill   acetaminophen (TYLENOL) 500 MG tablet Take 500 mg by mouth every 6 (six) hours as needed.     albuterol (VENTOLIN HFA) 108 (90 Base) MCG/ACT inhaler Inhale 2 puffs into the lungs every 6 (six) hours as needed for wheezing or shortness of breath. 8 g 5   calcium carbonate (OS-CAL) 600 MG TABS tablet Take 600 mg by mouth daily.     cetirizine-pseudoephedrine (ZYRTEC-D) 5-120 MG per tablet Take 1 tablet by mouth once as needed for allergies.     Cholecalciferol (VITAMIN D-3 PO) Take 4,000 Units by mouth daily.     CRANBERRY PO Take by mouth.     estradiol (ESTRACE) 0.1 MG/GM vaginal cream Place 1 Applicatorful vaginally 2 (two) times a week.     Estradiol-Estriol-Progesterone (BIEST/PROGESTERONE) CREA Apply 2 clicks daily, except on Sunday. 40 g PRN   fluticasone furoate-vilanterol (BREO ELLIPTA) 100-25 MCG/INH AEPB Inhale 1 puff into the lungs daily. 60 each 3   Glucosamine-Chondroitin (GLUCOSAMINE CHONDR COMPLEX  PO) Take 1 tablet by mouth daily.     Omega-3 Fatty Acids (FISH OIL) 1000 MG CAPS Take 3 capsules by mouth daily.      pantoprazole (PROTONIX) 40 MG tablet TOME UNA TABLETA TODOS LOS DIAS 90 tablet 1   RESTASIS 0.05 % ophthalmic emulsion 1 drop 2 (two) times daily.     traZODone (DESYREL) 50 MG tablet TAKE 0.5-1 TABLETS BY MOUTH AT BEDTIME AS NEEDED FOR SLEEP. (Patient not taking: Reported on 10/16/2020) 90 tablet 1   No current facility-administered medications on file prior to visit.        ROS:  All others reviewed and negative.  Objective        PE:  BP 110/64 (BP Location: Left Arm, Patient  Position: Sitting, Cuff Size: Large)   Pulse 99   Temp 99 F (37.2 C) (Oral)   Ht _0  (1.6 m)   Wt 136 lb (61.7 kg)   SpO2 98%   BMI 24.09 kg/m                 Constitutional: Pt appears in NAD               HENT: Head: NCAT.                Right Ear: External ear normal.                 Left Ear: External ear normal.                Eyes: . Pupils are equal, round, and reactive to light. Conjunctivae and EOM are normal               Nose: without d/c or deformity               Neck: Neck supple. Gross normal ROM               Cardiovascular: Normal rate and regular rhythm.                 Pulmonary/Chest: Effort normal and breath sounds without rales or wheezing.                Abd:  Soft, midl low mid abd tedner, ND, + BS, no organomegaly               Neurological: Pt is alert. At baseline orientation, motor grossly intact               Skin: Skin is warm. No rashes, no other new lesions, LE edema - none               Psychiatric: Pt behavior is normal without agitation   Micro: none  Cardiac tracings I have personally interpreted today:  none  Pertinent Radiological findings (summarize): none   Lab Results  Component Value Date   WBC 9.0 07/25/2020   HGB 12.8 07/25/2020   HCT 37.4 07/25/2020   PLT 284.0 07/25/2020   GLUCOSE 95 07/25/2020   CHOL 217 (H) 04/15/2020   TRIG 302.0 (H) 04/15/2020   HDL 47.00 04/15/2020   LDLDIRECT 149.0 04/15/2020   LDLCALC 124 (H) 08/01/2018   ALT 35 10/16/2020   AST 24 10/16/2020   NA 139 07/25/2020   K 3.7 07/25/2020   CL 101 07/25/2020   CREATININE 0.81 07/25/2020   BUN 19 07/25/2020   CO2 28 07/25/2020   TSH 1.40 04/15/2020   HGBA1C 6.2 04/15/2020   Assessment/Plan:  Donna Holmes is a 70 y.o. 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  UIP (usual interstitial pneumonitis) (HCC) Recent dx, for pulm f/u as planned, etiology unclear  Hyperglycemia Lab Results  Component Value Date   HGBA1C 6.2 04/15/2020   Stable, pt to continue current medical treatment  - diet   Bad odor of urine Likely cystitis - for empiric antibx, urine culture,  to f/u any worsening symptoms or concerns  Followup: Return in about 6 months (around 04/18/2021), or if symptoms worsen or fail to improve.  Cathlean Cower, MD 10/18/2020 9:34 PM [facility]

## 2020-10-16 NOTE — Progress Notes (Signed)
Subjective:  Donna Holmes presents today to [facility] with her husband [name], to see pharmacy team for antifibrotic new start.   Patient was last seen and referred by Dr. Erin Fulling on 09/18/20 (initial visit).  Pertinent past medical history includes carotid artery disease, hyperlipidemia, history of iron-deficiency aemia,.  She is naive to antifibrotics. All inflammatory labs were negative ruling out autoimmune component - we discussed this today. She has never smoked.   History of elevated LFTs: Yes History of diarrhea, nausea, vomiting: No  History of CAD: No History of MI: No Current anticoagulant use: No History of HTN: No   Objective: Allergies  Allergen Reactions   Codeine Nausea And Vomiting   Oxycontin [Oxycodone Hcl] Nausea Only and Other (See Comments)    FELT LIKE I WAS GOING TO FAINT   Statins Other (See Comments)    myalgias   Ibuprofen Other (See Comments)    Other reaction(s): GI Bleed    Outpatient Encounter Medications as of 10/16/2020  Medication Sig   acetaminophen (TYLENOL) 500 MG tablet Take 500 mg by mouth every 6 (six) hours as needed.   albuterol (VENTOLIN HFA) 108 (90 Base) MCG/ACT inhaler Inhale 2 puffs into the lungs every 6 (six) hours as needed for wheezing or shortness of breath.   calcium carbonate (OS-CAL) 600 MG TABS tablet Take 600 mg by mouth daily.   cetirizine-pseudoephedrine (ZYRTEC-D) 5-120 MG per tablet Take 1 tablet by mouth once as needed for allergies.   Cholecalciferol (VITAMIN D-3 PO) Take 4,000 Units by mouth daily.   CRANBERRY PO Take by mouth.   estradiol (ESTRACE) 0.1 MG/GM vaginal cream Place 1 Applicatorful vaginally 2 (two) times a week.   Estradiol-Estriol-Progesterone (BIEST/PROGESTERONE) CREA Apply 2 clicks daily, except on Sunday.   fluticasone furoate-vilanterol (BREO ELLIPTA) 100-25 MCG/INH AEPB Inhale 1 puff into the lungs daily.   Glucosamine-Chondroitin (GLUCOSAMINE CHONDR COMPLEX PO) Take 1 tablet by mouth daily.    Omega-3 Fatty Acids (FISH OIL) 1000 MG CAPS Take 3 capsules by mouth daily.    pantoprazole (PROTONIX) 40 MG tablet TOME UNA TABLETA TODOS LOS DIAS   RESTASIS 0.05 % ophthalmic emulsion 1 drop 2 (two) times daily.   traZODone (DESYREL) 50 MG tablet TAKE 0.5-1 TABLETS BY MOUTH AT BEDTIME AS NEEDED FOR SLEEP. (Patient not taking: Reported on 10/16/2020)   No facility-administered encounter medications on file as of 10/16/2020.     Immunization History  Administered Date(s) Administered   Fluad Quad(high Dose 65+) 12/13/2018, 01/16/2020   Influenza-Unspecified 12/02/2015, 12/21/2016, 12/28/2017   PFIZER(Purple Top)SARS-COV-2 Vaccination 04/22/2019, 05/17/2019, 12/23/2019   Pneumococcal Conjugate-13 01/29/2014   Pneumococcal Polysaccharide-23 05/06/2017   Td 05/19/2009     PFT's TLC  Date Value Ref Range Status  09/23/2020 2.88 L Final     CMP     Component Value Date/Time   NA 139 07/25/2020 1345   K 3.7 07/25/2020 1345   CL 101 07/25/2020 1345   CO2 28 07/25/2020 1345   GLUCOSE 95 07/25/2020 1345   BUN 19 07/25/2020 1345   CREATININE 0.81 07/25/2020 1345   CREATININE 0.89 04/26/2011 1117   CALCIUM 9.9 07/25/2020 1345   PROT 7.8 07/25/2020 1345   PROT 7.8 10/31/2019 1445   ALBUMIN 4.7 07/25/2020 1345   ALBUMIN 5.0 (H) 10/31/2019 1445   AST 26 07/25/2020 1345   ALT 38 (H) 07/25/2020 1345   ALKPHOS 67 07/25/2020 1345   BILITOT 0.5 07/25/2020 1345   BILITOT 0.5 10/31/2019 1445   GFRNONAA 69 04/26/2011 1117  GFRAA 80 04/26/2011 1117      CBC    Component Value Date/Time   WBC 9.0 07/25/2020 1345   RBC 4.38 07/25/2020 1345   HGB 12.8 07/25/2020 1345   HGB 12.8 12/16/2017 1249   HCT 37.4 07/25/2020 1345   HCT 38.4 12/16/2017 1249   PLT 284.0 07/25/2020 1345   PLT 307 12/16/2017 1249   MCV 85.4 07/25/2020 1345   MCV 88 12/16/2017 1249   MCH 29.3 12/16/2017 1249   MCH 29.9 05/17/2014 0529   MCHC 34.3 07/25/2020 1345   RDW 14.7 07/25/2020 1345   RDW 13.7 12/16/2017  1249   LYMPHSABS 2.8 07/25/2020 1345   MONOABS 0.6 07/25/2020 1345   EOSABS 0.2 07/25/2020 1345   BASOSABS 0.1 07/25/2020 1345      LFT's Hepatic Function Latest Ref Rng & Units 07/25/2020 04/15/2020 10/31/2019  Total Protein 6.0 - 8.3 g/dL 7.8 7.8 7.8  Albumin 3.5 - 5.2 g/dL 4.7 4.7 5.0(H)  AST 0 - 37 U/L 26 30 34  ALT 0 - 35 U/L 38(H) 34 46(H)  Alk Phosphatase 39 - 117 U/L 67 67 76  Total Bilirubin 0.2 - 1.2 mg/dL 0.5 0.7 0.5  Bilirubin, Direct 0.0 - 0.3 mg/dL 0.1 0.1 0.13     HRCT (Spectrum of findings compatible with 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 Findings are categorized as probable UIP per consensus guidelines)  Assessment and Plan Ms. Crill does not have any specific caution or contraindication to Ofev or Esbriet use and we reviewed both medications in detail as below.  Reviewed that the goal is to slow progression of ILD for both. Reviewed similar hepatic function monitoring. Initially she stated she preferred N/V to diarrhea but towards the end of her visit, she decided she'd like to think further about Ofev vs. Esbriet. Respect this decision and we will allow her to make decision prior to submitting any prior auth inquiries.  Esbriet Medication Management Thoroughly counseled patient on the efficacy, mechanism of action, dosing, administration, adverse effects, and monitoring parameters of Esbriet.  Patient verbalized understanding. Patient education handout provided.   Goals of Therapy: Will not stop or reverse the progression of ILD. It will slow the progression of ILD.   Dosing: Starting dose will be Esbriet 267 mg 1 tablet three times daily for 7 days, then 2 tablets three times daily for 7 days, then 3 tablets three times daily.  Maintenance dose will be 801 mg 1 tablet three times daily if tolerated.  Stressed the importance of taking with meals and space at least 5-6 hours apart to minimize stomach upset.    Adverse Effects: Nausea, vomiting, diarrhea, weight loss Abdominal pain GERD Sun sensitivity/rash - patient advised to wear sunscreen when exposed to sunlight Dizziness Fatigue  Monitoring: Draw hepatic function panel today for updated baseline Monitor for diarrhea, nausea and vomiting, GI perforation, hepatotoxicity  Monitor LFTs - baseline, monthly for first 6 months, then every 3 months routinely CBC w differential at baseline and every 3 months routinely  Access: Rx to be sent to pharmacy pending approval.  Ofev Medication Management Thoroughly counseled patient on the efficacy, mechanism of action, dosing, administration, adverse effects, and monitoring parameters of Ofev. Patient verbalized understanding. Patient education handout provided.   Goals of Therapy: Will not stop or reverse the progression of ILD. It will slow the progression of ILD.  Inhibits tyrosine kinase inhibitors which slow the fibrosis/progression of ILD -Significant reduction in the rate of disease progression  was observed after treatment (61.1% [before] vs 33.3% [after], P?=?0.008) over 42 weeks.  Ofev Dosing: 150 mg (one capsule) by mouth twice daily (approx 12 hours apart). Discussed taking with food approximately 12 hours apart. Discussed that capsule should not be crushed or split.  Ofev Adverse Effects: Nausea, vomiting, diarrhea (2 in 3 patients) appetite loss, weight loss - management of diarrhea with loperamide discussed including max use of 48 hours and max of 8 capsules per day. Abdominal pain (up to 1 in 5 patients) Nasopharyngitis (13%), UTI (6%) Risk of thrombosis (3%) and acute MI (2%) Hypertension (5%) Dizziness Fatigue (10%)  Medication Reconciliation A drug regimen assessment was performed, including review of allergies, interactions, disease-state management, dosing and immunization history. Medications were reviewed with the patient, including name, instructions, indication,  goals of therapy, potential side effects, importance of adherence, and safe use.  Immunizations She is UTD on pneumonia vaccine. She is eligible for zoster vaccine. Patient has received 3 COVID19 vaccines.  Plan: Repeat hepatic function panel today for baseline Patient completed her portion of Esbriet patient assistance paperwork. However she will plan to weigh her options between Ofev and Esbriet prior to fully moving forward. She will call pharmacy team once she reaches decision. If she chooses to move forward with Ofev trial, she will complete and return with [facility] patient assistance paperwork along with income documents  If she chooses to move forward with Esbriet, we will get Dr. August Albino signature on [place] patient assistance form and submit. Placed patient's forms in "awaiting info" folder  This appointment required 60 minutes of patient care (this includes precharting, chart review, review of results, face-to-face care, etc.).  Thank you for involving pharmacy to assist in providing this patient's care.    Knox Saliva, PharmD, MPH, BCPS Clinical Pharmacist (Rheumatology and Pulmonology)

## 2020-10-17 ENCOUNTER — Telehealth: Payer: Self-pay | Admitting: Pulmonary Disease

## 2020-10-17 DIAGNOSIS — Z79899 Other long term (current) drug therapy: Secondary | ICD-10-CM

## 2020-10-17 DIAGNOSIS — J849 Interstitial pulmonary disease, unspecified: Secondary | ICD-10-CM

## 2020-10-17 NOTE — Telephone Encounter (Signed)
Returned call to patient. Will start Esbriet BIV  Dx: IPF  Dose based on 259m tabs: Take 1 tab by mouth three times daily for 7 days, then take 2 tabs by mouth three times daily for 7 days, then take 3 tabs by mouth three times daily thereafter. #207 tabs for first 30 days  [place] signed by patient. Will place provider form in Dr. DAugust Albinomailbox.  DKnox Saliva PharmD, MPH, BCPS Clinical Pharmacist (Rheumatology and Pulmonology)

## 2020-10-18 ENCOUNTER — Encounter: Payer: Self-pay | Admitting: Internal Medicine

## 2020-10-18 LAB — URINE CULTURE

## 2020-10-18 NOTE — Assessment & Plan Note (Signed)
Recent dx, for pulm f/u as planned, etiology unclear

## 2020-10-18 NOTE — Assessment & Plan Note (Signed)
Lab Results  Component Value Date   HGBA1C 6.2 04/15/2020   Stable, pt to continue current medical treatment  - diet

## 2020-10-18 NOTE — Assessment & Plan Note (Signed)
Likely cystitis - for empiric antibx, urine culture,  to f/u any worsening symptoms or concerns

## 2020-10-19 ENCOUNTER — Encounter: Payer: Self-pay | Admitting: Internal Medicine

## 2020-10-20 NOTE — Telephone Encounter (Signed)
Submitted a Prior Authorization request to [facility] for ESBRIET via CoverMyMeds. Will update once we receive a response.   Key: [id number] - PA Case ID: [id number]

## 2020-10-21 ENCOUNTER — Other Ambulatory Visit (HOSPITAL_COMMUNITY): Payer: Self-pay

## 2020-10-21 MED ORDER — PIRFENIDONE 267 MG PO TABS
ORAL_TABLET | ORAL | 0 refills | Status: DC
  Filled 2020-10-21: qty 207, fill #0
  Filled 2020-10-22: qty 207, 30d supply, fill #0

## 2020-10-21 NOTE — Telephone Encounter (Signed)
Rx sent to [facility] for patient's Esbriet starter dose. Patient advised that rx will be delivered to her home later this week or early next week.  Routing to [employer], CPhT, for onboarding needs.  Sent patient Mychart message with pharmacy information  Knox Saliva, PharmD, MPH, BCPS Clinical Pharmacist (Rheumatology and Pulmonology)

## 2020-10-21 NOTE — Telephone Encounter (Signed)
Received notification from [facility] regarding a prior authorization for ESBRIET. Authorization has been APPROVED from 10/20/20 to 03/14/21.   Authorization # [id number]  Ran test claim, patient's copay for 1st month supply is $6.00. Copay was the same for the maintenance dose. Patient can fill through [facility].

## 2020-10-22 ENCOUNTER — Other Ambulatory Visit (HOSPITAL_COMMUNITY): Payer: Self-pay

## 2020-10-22 ENCOUNTER — Ambulatory Visit: Payer: Self-pay | Admitting: Internal Medicine

## 2020-10-22 NOTE — Telephone Encounter (Signed)
Shipment set up to deliver to patient on 10/24/20

## 2020-10-23 ENCOUNTER — Other Ambulatory Visit (HOSPITAL_COMMUNITY): Payer: Self-pay

## 2020-11-01 ENCOUNTER — Encounter (HOSPITAL_COMMUNITY): Payer: Self-pay

## 2020-11-01 ENCOUNTER — Ambulatory Visit (HOSPITAL_COMMUNITY)
Admission: EM | Admit: 2020-11-01 | Discharge: 2020-11-01 | Disposition: A | Discharge status: Home / Self Care | Payer: Medicare PPO | Attending: Medical Oncology | Admitting: Medical Oncology

## 2020-11-01 ENCOUNTER — Other Ambulatory Visit: Payer: Self-pay

## 2020-11-01 ENCOUNTER — Ambulatory Visit (INDEPENDENT_AMBULATORY_CARE_PROVIDER_SITE_OTHER): Payer: Medicare PPO

## 2020-11-01 DIAGNOSIS — W19XXXA Unspecified fall, initial encounter: Secondary | ICD-10-CM

## 2020-11-01 DIAGNOSIS — R52 Pain, unspecified: Secondary | ICD-10-CM

## 2020-11-01 DIAGNOSIS — R0781 Pleurodynia: Secondary | ICD-10-CM

## 2020-11-01 DIAGNOSIS — J984 Other disorders of lung: Secondary | ICD-10-CM | POA: Diagnosis not present

## 2020-11-01 MED ORDER — LIDOCAINE 5 % EX PTCH: 1.0000 | MEDICATED_PATCH | CUTANEOUS | 0 refills | Status: DC

## 2020-11-01 NOTE — ED Provider Notes (Signed)
[facility]    CSN: [id number] Arrival date & time: 11/01/20  1211      History   Chief Complaint Chief Complaint  Patient presents with   Fall    HPI Donna Holmes is a 70 y.o. female.   HPI  Fall: Pt states that she fell 5 days ago on her back. She states that she fell on her mid back. She thinks she may have hit her head but did not have LOC and has not had any double vision, visual changes, significant neurological changes, loss of bowel or bladder function, chest pain, hemoptysis, abdominal pain, severe headache, vomiting, SOB. Since then she has been having back pain. Back pain described on the left posterior rib area. Worse when she coughs or sneezes. She has tried tylenol for symptoms with some relief.     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   Complication of anesthesia    GERD (gastroesophageal reflux disease)    Headache(784.0) 05/19/2009    Patient Active Problem List   Diagnosis Date Noted   UIP (usual interstitial pneumonitis) ([place]) 10/16/2020   Dyspnea 07/31/2020   Upper back pain 04/22/2020   Abnormal breath sounds 04/15/2020   Recurrent UTI 04/15/2020   Statin myopathy 04/15/2020   Low back pain 01/16/2020   Bad odor of urine 03/19/2019   B12 deficiency 08/02/2018   Hyperglycemia 07/31/2018   Symptomatic menopausal or female climacteric states 08/16/2017   Syncope 06/09/2015   Dysuria 06/09/2015   Anemia, iron deficiency 06/09/2015   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/16/2014   Encounter for well adult exam with abnormal findings 01/29/2014   Hyperlipidemia 01/29/2014   Drug rash 04/26/2011   Chest tightness 01/06/2011   Diarrhea 01/06/2011   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   Fatigue 12/29/2010   CAROTID ARTERY DISEASE 01/27/2010   Allergic rhinitis 05/19/2009   Headache(784.0) 05/19/2009     Past Surgical History:  Procedure Laterality Date   ANTERIOR AND POSTERIOR REPAIR WITH SACROSPINOUS FIXATION N/A 05/16/2014   Procedure: ANTERIOR AND POSTERIOR REPAIR WITH SACROSPINOUS LIGAMENT SUSPENSION;  Surgeon: Luz Lex, MD;  Location: [facility];  Service: Gynecology;  Laterality: N/A;   BARTHOLIN CYST MARSUPIALIZATION     BUNIONECTOMY Right    LAPAROSCOPIC ASSISTED VAGINAL HYSTERECTOMY N/A 05/16/2014   Procedure: LAPAROSCOPIC ASSISTED VAGINAL HYSTERECTOMY;  Surgeon: Luz Lex, MD;  Location: [facility];  Service: Gynecology;  Laterality: N/A;   NEUROMA SURGERY Right    2nd interspace    OB History   No obstetric history on file.      Home Medications    Prior to Admission medications   Medication Sig Start Date End Date Taking? Authorizing Provider  acetaminophen (TYLENOL) 500 MG tablet Take 500 mg by mouth every 6 (six) hours as needed.    [provider]  albuterol (VENTOLIN HFA) 108 (90 Base) MCG/ACT inhaler Inhale 2 puffs into the lungs every 6 (six) hours as needed for wheezing or shortness of breath. 07/25/20   Biagio Borg, MD  calcium carbonate (OS-CAL) 600 MG TABS tablet Take 600 mg by mouth daily.    [provider]  cetirizine-pseudoephedrine (ZYRTEC-D) 5-120 MG per tablet Take 1 tablet by mouth once as needed for allergies.    [provider]  Cholecalciferol (VITAMIN D-3 PO) Take 4,000 Units by mouth daily.    [provider]  CRANBERRY PO Take by mouth.    [provider]  estradiol (ESTRACE) 0.1 MG/GM vaginal cream Place 1 Applicatorful vaginally 2 (two) times a week.    [provider]  Estradiol-Estriol-Progesterone (BIEST/PROGESTERONE) CREA Apply 2 clicks daily, except on Sunday. 03/07/19   Glendale Chard, MD  fluticasone furoate-vilanterol (BREO ELLIPTA) 100-25 MCG/INH AEPB Inhale 1 puff into the lungs daily. 09/18/20   Freddi Starr, MD  Glucosamine-Chondroitin (GLUCOSAMINE CHONDR COMPLEX PO) Take 1  tablet by mouth daily.    [provider]  Omega-3 Fatty Acids (FISH OIL) 1000 MG CAPS Take 3 capsules by mouth daily.     [provider]  pantoprazole (PROTONIX) 40 MG tablet TOME UNA TABLETA TODOS LOS DIAS 07/15/20   Biagio Borg, MD  Pirfenidone (ESBRIET) 267 MG TABS Take 1 tab by mouth three times daily for 7 days, then take 2 tabs by mouth three times daily for 7 days, then take 3 tabs by mouth three times daily thereafter. 10/21/20   Freddi Starr, MD  RESTASIS 0.05 % ophthalmic emulsion 1 drop 2 (two) times daily. 08/08/20   [provider]  traZODone (DESYREL) 50 MG tablet TAKE 0.5-1 TABLETS BY MOUTH AT BEDTIME AS NEEDED FOR SLEEP. Patient not taking: Reported on 10/16/2020 06/06/20   Biagio Borg, MD    Family History Family History  Problem Relation Age of Onset   Alcohol abuse Father    Hypertension Mother    Prostate cancer Maternal Uncle     Social History Social History   Tobacco Use   Smoking status: Never   Smokeless tobacco: Never  Vaping Use   Vaping Use: Never used  Substance Use Topics   Alcohol use: No   Drug use: No     Allergies   Codeine, Oxycontin [oxycodone hcl], Statins, and Ibuprofen   Review of Systems Review of Systems  As stated above in HPI Physical Exam Triage Vital Signs ED Triage Vitals [11/01/20 1350]  Enc Vitals Group     BP 125/75     Pulse Rate (!) 101     Resp 18     Temp 98.8 F (37.1 C)     Temp Source Oral     SpO2 100 %     Weight      Height      Head Circumference      Peak Flow      Pain Score 8     Pain Loc      Pain Edu?      Excl. in [place]?    No data found.  Updated Vital Signs BP 125/75 (BP Location: Right Arm)   Pulse (!) 101   Temp 98.8 F (37.1 C) (Oral)   Resp 18   SpO2 100%   Physical Exam Vitals and nursing note reviewed.  Constitutional:      General: She is not in acute distress.    Appearance: Normal appearance. She is not ill-appearing, toxic-appearing or  diaphoretic.  HENT:     Head: Normocephalic and atraumatic.     Right Ear: Tympanic membrane normal.     Left Ear: Tympanic membrane normal.  Eyes:     Extraocular Movements: Extraocular movements intact.     Pupils: Pupils are equal, round, and reactive to light.  Cardiovascular:     Pulses: Normal pulses.  Musculoskeletal:        General: Tenderness (left posterior lateral mid ribcage) present. No swelling. Normal range of motion.  Cervical back: Normal range of motion and neck supple. No rigidity or tenderness.  Neurological:     Mental Status: She is alert.     UC Treatments / Results  Labs (all labs ordered are listed, but only abnormal results are displayed) Labs Reviewed - No data to display  EKG   Radiology No results found.  Procedures Procedures (including critical care time)  Medications Ordered in UC Medications - No data to display  Initial Impression / Assessment and Plan / UC Course  I have reviewed the triage vital signs and the nursing notes.  Pertinent labs & imaging results that were available during my care of the patient were reviewed by me and considered in my medical decision making (see chart for details).     New. X ray of the ribcage on the affected side is negative for fracture. Likely has some costochondritis or contusion from the event. I would recommend tylenol, lidocaine patches and heating pad for symptoms. Discussed red flag signs and symptoms for pain and head.  Final Clinical Impressions(s) / UC Diagnoses   Final diagnoses:  None   Discharge Instructions   None    ED Prescriptions   None    PDMP not reviewed this encounter.   Hughie Closs, [place] 11/01/20 1534

## 2020-11-01 NOTE — ED Triage Notes (Signed)
Pt states she had a fall 5 days ago on back,she now has back pain.

## 2020-11-04 ENCOUNTER — Encounter: Payer: Self-pay | Admitting: Internal Medicine

## 2020-11-04 ENCOUNTER — Ambulatory Visit: Payer: Medicare PPO | Admitting: Internal Medicine

## 2020-11-04 ENCOUNTER — Other Ambulatory Visit: Payer: Self-pay

## 2020-11-04 VITALS — BP 112/68 | HR 86 | Temp 97.9°F | Ht 62.4 in | Wt 136.6 lb

## 2020-11-04 DIAGNOSIS — I700 Atherosclerosis of aorta: Secondary | ICD-10-CM

## 2020-11-04 DIAGNOSIS — Z79899 Other long term (current) drug therapy: Secondary | ICD-10-CM | POA: Diagnosis not present

## 2020-11-04 DIAGNOSIS — J849 Interstitial pulmonary disease, unspecified: Secondary | ICD-10-CM

## 2020-11-04 DIAGNOSIS — E2839 Other primary ovarian failure: Secondary | ICD-10-CM | POA: Diagnosis not present

## 2020-11-04 DIAGNOSIS — N951 Menopausal and female climacteric states: Secondary | ICD-10-CM

## 2020-11-04 NOTE — Progress Notes (Signed)
I,Donna Holmes,acting as a Education administrator for Donna Greenland, MD.,have documented all relevant documentation on the behalf of Donna Greenland, MD,as directed by  Donna Greenland, MD while in the presence of Donna Greenland, MD.  This visit occurred during the SARS-CoV-2 public health emergency.  Safety protocols were in place, including screening questions prior to the visit, additional usage of staff PPE, and extensive cleaning of exam room while observing appropriate contact time as indicated for disinfecting solutions.  Subjective:     Patient ID: Donna Holmes , female    DOB: 01/25/1951 , 70 y.o.   MRN: [id number]   Chief Complaint  Patient presents with   hormones    HPI  The patient is here today for a follow-up on her hormones. She feels well on her current regimen. She has no specific concerns or complaints. She reports regular f/u with her PCP.  Since her last visit, she has been evaluated for shortness of breath. Workup involved CXR, chest CT and pulmonary evaluation. 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     Past Medical History:  Diagnosis Date   ALLERGIC RHINITIS 05/19/2009   Anemia, iron deficiency 06/09/2015   B12 deficiency 08/02/2018   Chagas' disease 14/12/3011   Complication of anesthesia    GERD (gastroesophageal reflux disease)    Headache(784.0) 05/19/2009     Family History  Problem Relation Age of Onset   Alcohol abuse Father    Hypertension Mother    Prostate cancer Maternal Uncle      Current Outpatient Medications:    acetaminophen (TYLENOL) 500 MG tablet, Take 500 mg by mouth every 6 (six) hours as needed., Disp: , Rfl:    albuterol (VENTOLIN HFA) 108 (90 Base) MCG/ACT inhaler, Inhale 2 puffs into the lungs every 6 (six) hours as needed for wheezing or shortness of breath., Disp: 8 g, Rfl: 5   calcium carbonate (OS-CAL) 600 MG TABS tablet, Take 600 mg by mouth daily., Disp: , Rfl:    cetirizine-pseudoephedrine (ZYRTEC-D) 5-120 MG per tablet,  Take 1 tablet by mouth once as needed for allergies., Disp: , Rfl:    Cholecalciferol (VITAMIN D-3 PO), Take 4,000 Units by mouth daily., Disp: , Rfl:    CRANBERRY PO, Take by mouth., Disp: , Rfl:    estradiol (ESTRACE) 0.1 MG/GM vaginal cream, Place 1 Applicatorful vaginally 2 (two) times a week., Disp: , Rfl:    Estradiol-Estriol-Progesterone (BIEST/PROGESTERONE) CREA, Apply 2 clicks daily, except on Sunday., Disp: 40 g, Rfl: PRN   fluticasone furoate-vilanterol (BREO ELLIPTA) 100-25 MCG/INH AEPB, Inhale 1 puff into the lungs daily., Disp: 60 each, Rfl: 3   Glucosamine-Chondroitin (GLUCOSAMINE CHONDR COMPLEX PO), Take 1 tablet by mouth daily., Disp: , Rfl:    Omega-3 Fatty Acids (FISH OIL) 1000 MG CAPS, Take 3 capsules by mouth daily. , Disp: , Rfl:    pantoprazole (PROTONIX) 40 MG tablet, TOME UNA TABLETA TODOS LOS DIAS, Disp: 90 tablet, Rfl: 1   Pirfenidone (ESBRIET) 267 MG TABS, Take 1 tab by mouth three times daily for 7 days, then take 2 tabs by mouth three times daily for 7 days, then take 3 tabs by mouth three times daily thereafter., Disp: 207 tablet, Rfl: 0   RESTASIS 0.05 % ophthalmic emulsion, 1 drop 2 (two) times daily., Disp: , Rfl:    Allergies  Allergen Reactions   Codeine Nausea And Vomiting   Oxycontin [Oxycodone Hcl] Nausea Only and Other (See Comments)    FELT LIKE  I WAS GOING TO FAINT   Statins Other (See Comments)    myalgias   Ibuprofen Other (See Comments)    Other reaction(s): GI Bleed     Review of Systems  Constitutional: Negative.   Respiratory: Negative.    Cardiovascular: Negative.   Gastrointestinal: Negative.   Neurological: Negative.     Today's Vitals   11/04/20 1001  BP: 112/68  Pulse: 86  Temp: 97.9 F (36.6 C)  TempSrc: Oral  Weight: 136 lb 9.6 oz (62 kg)  Height: 5' 2.4" (1.585 m)   Body mass index is 24.67 kg/m.  Wt Readings from Last 3 Encounters:  11/04/20 136 lb 9.6 oz (62 kg)  10/16/20 136 lb (61.7 kg)  09/18/20 135 lb (61.2 kg)     Objective:  Physical Exam Vitals and nursing note reviewed.  Constitutional:      Appearance: Normal appearance.  HENT:     Head: Normocephalic and atraumatic.     Nose:     Comments: Masked     Mouth/Throat:     Comments: Masked  Eyes:     Extraocular Movements: Extraocular movements intact.  Cardiovascular:     Rate and Rhythm: Normal rate and regular rhythm.     Heart sounds: Normal heart sounds.  Pulmonary:     Effort: Pulmonary effort is normal.     Breath sounds: Normal breath sounds.  Musculoskeletal:     Cervical back: Normal range of motion.  Skin:    General: Skin is warm.  Neurological:     General: No focal deficit present.     Mental Status: She is alert.  Psychiatric:        Mood and Affect: Mood normal.        Behavior: Behavior normal.        Assessment And Plan:     1. Symptomatic menopausal or female climacteric states Comments: Chronic. She will c/w Bi-est/prog/testosterone. She agrees to f/u in six months. I will check hormone levels today. She wants to continue for bone health.  2. Interstitial lung disease ([place]) Comments: CT scan and pulmonary results are   3. Aortic atherosclerosis ([facility]) Comments: Seen on Chest CT. Encouraged to follow heart healthy lifestyle. LDL 149, suggest starting statin therapy. I will defer treatment to her PCP.   4. Estrogen deficiency Comments: I will request most recent bone density from her GYN. She states it was done in 2021. She reports it was normal.   5. Drug therapy - Estradiol - Testosterone, Total - Progesterone   Patient was given opportunity to ask questions. Patient verbalized understanding of the plan and was able to repeat key elements of the plan. All questions were answered to their satisfaction.   I, Donna Greenland, MD, have reviewed all documentation for this visit. The documentation on 11/04/20 for the exam, diagnosis, procedures, and orders are all accurate and complete.   IF YOU HAVE  BEEN REFERRED TO A SPECIALIST, IT MAY TAKE 1-2 WEEKS TO SCHEDULE/PROCESS THE REFERRAL. IF YOU HAVE NOT HEARD FROM US/SPECIALIST IN TWO WEEKS, PLEASE GIVE [place] A CALL AT [phone].   THE PATIENT IS ENCOURAGED TO PRACTICE SOCIAL DISTANCING DUE TO THE COVID-19 PANDEMIC.

## 2020-11-04 NOTE — Patient Instructions (Signed)

## 2020-11-10 LAB — ESTRADIOL, FREE: Estradiol, Serum, MS: 5.7 pg/mL

## 2020-11-11 LAB — TESTOSTERONE: Testosterone: 4 ng/dL (ref 3–67)

## 2020-11-11 LAB — PROGESTERONE: Progesterone: 1.6 ng/mL

## 2020-11-13 ENCOUNTER — Other Ambulatory Visit (HOSPITAL_COMMUNITY): Payer: Self-pay

## 2020-11-13 ENCOUNTER — Other Ambulatory Visit: Payer: Self-pay | Admitting: Pulmonary Disease

## 2020-11-13 DIAGNOSIS — J849 Interstitial pulmonary disease, unspecified: Secondary | ICD-10-CM

## 2020-11-13 MED ORDER — PIRFENIDONE 267 MG PO TABS
ORAL_TABLET | ORAL | 0 refills | Status: DC
  Filled 2020-11-19: qty 207, 30d supply, fill #0

## 2020-11-18 ENCOUNTER — Other Ambulatory Visit (HOSPITAL_COMMUNITY): Payer: Self-pay

## 2020-11-19 ENCOUNTER — Other Ambulatory Visit (HOSPITAL_COMMUNITY): Payer: Self-pay

## 2020-11-20 ENCOUNTER — Other Ambulatory Visit (HOSPITAL_COMMUNITY): Payer: Self-pay

## 2020-11-24 ENCOUNTER — Other Ambulatory Visit: Payer: Self-pay | Admitting: Pharmacist

## 2020-11-24 ENCOUNTER — Telehealth: Payer: Self-pay | Admitting: Pharmacist

## 2020-11-24 ENCOUNTER — Other Ambulatory Visit (HOSPITAL_COMMUNITY): Payer: Self-pay

## 2020-11-24 DIAGNOSIS — J849 Interstitial pulmonary disease, unspecified: Secondary | ICD-10-CM

## 2020-11-24 MED ORDER — PIRFENIDONE 267 MG PO TABS
801.0000 mg | ORAL_TABLET | Freq: Three times a day (TID) | ORAL | 2 refills | Status: DC
  Filled 2020-11-24 – 2020-12-11 (×2): qty 270, 30d supply, fill #0
  Filled 2021-01-13: qty 270, 30d supply, fill #1
  Filled 2021-01-16 – 2021-02-10 (×2): qty 270, 30d supply, fill #2

## 2020-11-24 NOTE — Progress Notes (Signed)
Error

## 2020-11-24 NOTE — Progress Notes (Signed)
Interstitial Lung Disease Multidisciplinary Conference   Donna Holmes    MRN [id number]    DOB Mar 21, 1950  Primary Care Physician:John, Hunt Oris, MD  Referring Physician: Dr Erin Fulling  Time of Conference: 7.30am- 8.30am Date of conference: 10/22/20 Location of Conference: -  Virtual  Participating Pulmonary: Dr. Brand Males, MD - yes,  Dr Marshell Garfinkel, MD - yes Pathology: Dr Jaquita Folds, MD - no , Dr Enid Cutter - no Radiology: Dr Salvatore Marvel MD - no, Dr Vinnie Langton MD - yes,  Dr Lorin Picket, MD - no , Dr Eddie Candle - no Others: x  Brief History: 70 year old woman, never smoker who is referred to pulmonary clinic for evaluation of shortness of breath. HRCT shows moderate patchy confluent subpleural reticulation and ground glass opacitiy throughout both lungs with mild to moderate traction bronchiectasis with basilar predominance, probable UIP pattern. Serologies are negative.   Serology: Results for SILVER, ACHEY (MRN [id number]) as of 11/24/2020 10:20  Ref. Range 10/09/2020 10:20  CENTROMERE AB SCREEN Latest Ref Range: <1.0 NEG AI <0.3 NEG  Cyclic Citrullin Peptide Ab Latest Units: UNITS <16  ds DNA Ab Latest Units: IU/mL <1  RA Latex Turbid. Latest Ref Range: <14 IU/mL <14  ENA SM Ab Ser-aCnc Latest Ref Range: <1.0 NEG AI <1.0 NEG  SSA (Ro) (ENA) Antibody, IgG Latest Ref Range: <1.0 NEG AI <1.0 NEG  SSB (La) (ENA) Antibody, IgG Latest Ref Range: <1.0 NEG AI <1.0 NEG    MDD discussion of CT scan    - Date or time period of scan: July 2022  - Features mentioned:  Scan frrom July 2022: Periph predmom some GGA, ST, Subpleural retic, TB, Peri BL. High risk for [facility] but currently ndone.. Definite CCG. DX  PROB UIP.  - What is the final conclusion per 2018 ATS/Fleischner Criteria -  Probable UIP. 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 Brand Males, M.D., F.C.C.P,  Pulmonary and Critical Care Medicine Staff Physician, [place] Director - Interstitial Lung Disease  Program  Pulmonary [facility] [place], [place], [address]  Pager: [phone], If no answer or between  15:00h - 7:00h: call [phone] Telephone: [phone]  10:19 AM 11/24/2020 ...................................................................................................................Marland Kitchen References: Diagnosis of Hypersensitivity Pneumonitis in Adults. An Official ATS/JRS/ALAT Clinical Practice Guideline. Ragu G et al, [place] Aug 1;202(3):e36-e69.  Diagnosis of Idiopathic Pulmonary Fibrosis. An Official ATS/ERS/JRS/ALAT Clinical Practice Guideline. Raghu G et al, [place]. 2018 Sep 1;198(5):e44-e68.   IPF Suspected   Histopath ology Pattern      UIP  Probable UIP  Indeterminate for  UIP  Alternative  diagnosis    UIP  IPF  IPF  IPF  Non-IPF dx   HRCT   Probabe UIP  IPF  IPF  IPF (Likely)**  Non-IPF dx  Pattern  Indeterminate for UIP  IPF  IPF (Likely)**  Indeterminate  for IPF**  Non-IPF dx     Alternative diagnosis  IPF (Likely)**/ non-IPF dx  Non-IPF dx  Non-IPF dx  Non-IPF dx     Idiopathic pulmonary fibrosis diagnosis based upon HRCT and Biopsy paterns.  ** IPF is the likely diagnosis when any of following features are present:  Moderate-to-severe traction bronchiectasis/bronchiolectasis (defined as mild traction bronchiectasis/bronchiolectasis in four or more lobes including the lingual as a lobe, or moderate to severe traction bronchiectasis in two or more lobes) in a man over age 72 years or in a woman over age 72 years Extensive (>30%) reticulation on HRCT and an age >72 years  Increased neutrophils and/or absence of lymphocytosis in BAL fluid  Multidisciplinary discussion reaches a confident diagnosis of IPF.   **Indeterminate for IPF  Without an adequate biopsy is unlikely to be IPF  With an adequate biopsy may be reclassified to a more specific diagnosis after multidisciplinary discussion and/or additional consultation.   dx = diagnosis; HRCT = high-resolution computed tomography; IPF = idiopathic pulmonary fibrosis; UIP = usual interstitial pneumonia.

## 2020-11-24 NOTE — Telephone Encounter (Signed)
Received notification from patient that she received Esbriet starter dose bottle again from pharmacy. Rx was mistakenly reordered and should have been sent for maintenance dose of 801 mg (three tabs) three times daily. Spoke with pharmacist at [facility] who requested rx for maintenance dose be resent  Knox Saliva, PharmD, MPH, BCPS Clinical Pharmacist (Rheumatology and Pulmonology)

## 2020-11-25 ENCOUNTER — Ambulatory Visit: Payer: Medicare PPO | Admitting: Pulmonary Disease

## 2020-11-25 ENCOUNTER — Encounter: Payer: Self-pay | Admitting: Pulmonary Disease

## 2020-11-25 ENCOUNTER — Other Ambulatory Visit: Payer: Self-pay

## 2020-11-25 VITALS — BP 112/70 | HR 88 | Ht 62.5 in | Wt 138.0 lb

## 2020-11-25 DIAGNOSIS — Z79899 Other long term (current) drug therapy: Secondary | ICD-10-CM

## 2020-11-25 DIAGNOSIS — J84112 Idiopathic pulmonary fibrosis: Secondary | ICD-10-CM | POA: Diagnosis not present

## 2020-11-25 LAB — HEPATIC FUNCTION PANEL
ALT: 33 U/L (ref 0–35)
AST: 27 U/L (ref 0–37)
Albumin: 4.4 g/dL (ref 3.5–5.2)
Alkaline Phosphatase: 70 U/L (ref 39–117)
Bilirubin, Direct: 0 mg/dL (ref 0.0–0.3)
Total Bilirubin: 0.2 mg/dL (ref 0.2–1.2)
Total Protein: 7.6 g/dL (ref 6.0–8.3)

## 2020-11-25 NOTE — Progress Notes (Signed)
Synopsis: Referred in July 202 for shortness of breath by Cathlean Cower, MD  Subjective:   PATIENT ID: Donna [place] GENDER: female DOB: 08/07/1950, MRN: [id number]  HPI  Chief Complaint  Patient presents with   Follow-up    2 mo f/u for ILD. States she has been on Esbriet now for 1 month. Denied any nausea or diarrhea but has noticed some stomach pain. Does have a productive cough with clear phlegm.    Donna [place] is a 70 year old woman, never smoker who returns to pulmonary clinic for follow up of pulmonary fibrosis.  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 She met with our pharmacy team and has been started on esbriet therapy. She is currently taking esbriet 3 x 252m tablet, three times daily. She has been ordered 8041mtablets to be taken 3 times daily and is awaiting this prescription. She complains of stomch discomfort since taking the medication which is alleviated by eating food. She also complains of headaches every other day that are relieved by acetaminophen.   She is walking 2 miles per day. She is using breo ellipta daily and as needed albuterol with relief.   OV 09/18/20 She has noticed shortness of breath for a few months with some wheezing, dry cough and chest tightness. The dyspnea is most notable with exertion. She denies any recent infections and has not had covid. She denies any respiratory issues in childhood.   She does report reflux symptoms and has been taking pantoprazole 4052maily. She denies any sinus congestion and post-nasal drainage. She denies any muscle aches, morning stiffness of skin rashes.  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/1245Complication of anesthesia    GERD (gastroesophageal reflux disease)    Headache(784.0) 05/19/2009      Family History  Problem Relation Age of Onset   Alcohol abuse Father    Hypertension Mother    Prostate cancer Maternal Uncle      Social History   Socioeconomic History   Marital status: Married    Spouse name: Not on file   Number of children: 1   Years of education: Not on file   Highest education level: Not on file  Occupational History   Occupation: nurse retired  Tobacco Use   Smoking status: Never   Smokeless tobacco: Never  Vaping Use   Vaping Use: Never used  Substance and Sexual Activity   Alcohol use: No   Drug use: No   Sexual activity: Not Currently  Other Topics Concern   Not on file  Social History Narrative   Not on file   Social Determinants of Health   Financial Resource Strain: Not on file  Food Insecurity: Not on file  Transportation Needs: Not on file  Physical Activity: Not on file  Stress: Not on file  Social Connections: Not on file  Intimate Partner Violence: Not on file     Allergies  Allergen Reactions   Codeine Nausea And Vomiting   Oxycontin [Oxycodone Hcl] Nausea Only and Other (See Comments)    FELT LIKE I WAS GOING TO FAINT   Statins Other (See Comments)    myalgias   Ibuprofen Other (See Comments)    Other reaction(s): GI Bleed     Outpatient Medications Prior to  Visit  Medication Sig Dispense Refill   acetaminophen (TYLENOL) 500 MG tablet Take 500 mg by mouth every 6 (six) hours as needed.     albuterol (VENTOLIN HFA) 108 (90 Base) MCG/ACT inhaler Inhale 2 puffs into the lungs every 6 (six) hours as needed for wheezing or shortness of breath. 8 g 5   calcium carbonate (OS-CAL) 600 MG TABS tablet Take 600 mg by mouth daily.     cetirizine-pseudoephedrine (ZYRTEC-D) 5-120 MG per tablet Take 1 tablet by mouth once as needed for allergies.     Cholecalciferol (VITAMIN D-3 PO) Take 4,000 Units by mouth daily.     CRANBERRY PO Take by mouth.     estradiol (ESTRACE) 0.1 MG/GM vaginal cream Place 1 Applicatorful vaginally 2  (two) times a week.     Estradiol-Estriol-Progesterone (BIEST/PROGESTERONE) CREA Apply 2 clicks daily, except on Sunday. 40 g PRN   fluticasone furoate-vilanterol (BREO ELLIPTA) 100-25 MCG/INH AEPB Inhale 1 puff into the lungs daily. 60 each 3   Glucosamine-Chondroitin (GLUCOSAMINE CHONDR COMPLEX PO) Take 1 tablet by mouth daily.     Omega-3 Fatty Acids (FISH OIL) 1000 MG CAPS Take 3 capsules by mouth daily.      pantoprazole (PROTONIX) 40 MG tablet TOME UNA TABLETA TODOS LOS DIAS 90 tablet 1   Pirfenidone (ESBRIET) 267 MG TABS Take 3 tablets (801 mg total) by mouth 3 (three) times daily. 270 tablet 2   RESTASIS 0.05 % ophthalmic emulsion 1 drop 2 (two) times daily.     No facility-administered medications prior to visit.    Review of Systems  Constitutional:  Negative for chills, fever, malaise/fatigue and weight loss.  HENT:  Negative for congestion, sinus pain and sore throat.   Eyes: Negative.   Respiratory:  Negative for cough, hemoptysis, sputum production, shortness of breath and wheezing.   Cardiovascular:  Negative for chest pain, palpitations, orthopnea, claudication and leg swelling.  Gastrointestinal:  Positive for abdominal pain. Negative for heartburn, nausea and vomiting.  Genitourinary: Negative.   Musculoskeletal:  Negative for joint pain and myalgias.  Skin:  Negative for rash.  Neurological:  Positive for headaches. Negative for weakness.  Endo/Heme/Allergies: Negative.   Psychiatric/Behavioral: Negative.      Objective:   Vitals:   11/25/20 1159  BP: 112/70  Pulse: 88  SpO2: 99%  Weight: 138 lb (62.6 kg)  Height: 5' 2.5" (1.588 m)    Physical Exam Constitutional:      General: She is not in acute distress.    Appearance: She is not ill-appearing.  HENT:     Head: Normocephalic and atraumatic.  Eyes:     General: No scleral icterus.    Conjunctiva/sclera: Conjunctivae normal.     Pupils: Pupils are equal, round, and reactive to light.   Cardiovascular:     Rate and Rhythm: Normal rate and regular rhythm.     Pulses: Normal pulses.     Heart sounds: Normal heart sounds. No murmur heard. Pulmonary:     Effort: Pulmonary effort is normal.     Breath sounds: Rales present. No wheezing or rhonchi.  Abdominal:     General: Bowel sounds are normal.     Palpations: Abdomen is soft.  Musculoskeletal:     Right lower leg: No edema.     Left lower leg: No edema.  Lymphadenopathy:     Cervical: No cervical adenopathy.  Skin:    General: Skin is warm and dry.  Neurological:     General: No focal deficit  present.     Mental Status: She is alert.   CBC    Component Value Date/Time   WBC 9.0 07/25/2020 1345   RBC 4.38 07/25/2020 1345   HGB 12.8 07/25/2020 1345   HGB 12.8 12/16/2017 1249   HCT 37.4 07/25/2020 1345   HCT 38.4 12/16/2017 1249   PLT 284.0 07/25/2020 1345   PLT 307 12/16/2017 1249   MCV 85.4 07/25/2020 1345   MCV 88 12/16/2017 1249   MCH 29.3 12/16/2017 1249   MCH 29.9 05/17/2014 0529   MCHC 34.3 07/25/2020 1345   RDW 14.7 07/25/2020 1345   RDW 13.7 12/16/2017 1249   LYMPHSABS 2.8 07/25/2020 1345   MONOABS 0.6 07/25/2020 1345   EOSABS 0.2 07/25/2020 1345   BASOSABS 0.1 07/25/2020 1345   BMP Latest Ref Rng & Units 07/25/2020 04/15/2020 03/19/2019  Glucose 70 - 99 mg/dL 95 88 97  BUN 6 - 23 mg/dL _0 Creatinine 0.40 - 1.20 mg/dL 0.81 0.70 0.77  Sodium 135 - 145 mEq/L 139 138 140  Potassium 3.5 - 5.1 mEq/L 3.7 4.3 4.0  Chloride 96 - 112 mEq/L 101 104 102  CO2 19 - 32 mEq/L _1 Calcium 8.4 - 10.5 mg/dL 9.9 10.0 10.2   Chest imaging: HRCT Chest 10/03/20 Moderate patchy confluent subpleural reticulation and ground-glass opacity throughout both lungs with associated mild-to-moderate traction bronchiectasis and mild architectural distortion. There is a basilar predominance to these findings. No frank honeycombing. Findings are categorized as probable UIP per consensus guidelines.  CXR  07/25/20 Heart size and mediastinal contours are within normal limits. Study is hypoinspiratory with crowding of the perihilar and bibasilar bronchovascular markings. Given the low lung volumes, lungs are clear, possible mild atelectasis at the LEFT lung base. No pleural effusion or pneumothorax. Osseous structures about the chest are unremarkable.  PFT: PFT Results Latest Ref Rng & Units 09/23/2020  FVC-Pre L 1.82  FVC-Predicted Pre % 64  FVC-Post L 1.81  FVC-Predicted Post % 64  Pre FEV1/FVC % % 81  Post FEV1/FCV % % 82  FEV1-Pre L 1.47  FEV1-Predicted Pre % 69  FEV1-Post L 1.49  DLCO uncorrected ml/min/mmHg 13.70  DLCO UNC% % 73  DLVA Predicted % 104  TLC L 2.88  TLC % Predicted % 58  RV % Predicted % 46  PFT 2022 - Moderate restriction and minimal diffusion defect  Echo 2012: LV EF 55-60%. Grade I diastolic dysfunction.    Assessment & Plan:   IPF (idiopathic pulmonary fibrosis) ([place]) - Plan: Pulmonary Function Test  Medication management - Plan: Hepatic function panel  Discussion: Donna Holmes is a 70 year old woman, never smoker who returns to pulmonary clinic for follow up of pulmonary fibrosis.  She was started on esbriet therapy in [place] and has tolerated the increase in dosages. She will be starting the maintenance dosing soon which is 813m tablet, 3 times daily. We will check comprehensive metabolic panel today.   She is to continue breo ellipta 1 puff daily as she feels her breathing is better since it was initiated. She can continue as needed albuterol as well.  She is to continue PPI therapy for her GERD.   Follow up in 4 months with repeat PFTs.  JFreda Jackson MD [facility] Office: [phone]  Current Outpatient Medications:    acetaminophen (TYLENOL) 500 MG tablet, Take 500 mg by mouth every 6 (six) hours as needed., Disp: , Rfl:    albuterol (VENTOLIN  HFA) 108 (90 Base) MCG/ACT inhaler, Inhale 2 puffs into the lungs  every 6 (six) hours as needed for wheezing or shortness of breath., Disp: 8 g, Rfl: 5   calcium carbonate (OS-CAL) 600 MG TABS tablet, Take 600 mg by mouth daily., Disp: , Rfl:    cetirizine-pseudoephedrine (ZYRTEC-D) 5-120 MG per tablet, Take 1 tablet by mouth once as needed for allergies., Disp: , Rfl:    Cholecalciferol (VITAMIN D-3 PO), Take 4,000 Units by mouth daily., Disp: , Rfl:    CRANBERRY PO, Take by mouth., Disp: , Rfl:    estradiol (ESTRACE) 0.1 MG/GM vaginal cream, Place 1 Applicatorful vaginally 2 (two) times a week., Disp: , Rfl:    Estradiol-Estriol-Progesterone (BIEST/PROGESTERONE) CREA, Apply 2 clicks daily, except on Sunday., Disp: 40 g, Rfl: PRN   fluticasone furoate-vilanterol (BREO ELLIPTA) 100-25 MCG/INH AEPB, Inhale 1 puff into the lungs daily., Disp: 60 each, Rfl: 3   Glucosamine-Chondroitin (GLUCOSAMINE CHONDR COMPLEX PO), Take 1 tablet by mouth daily., Disp: , Rfl:    Omega-3 Fatty Acids (FISH OIL) 1000 MG CAPS, Take 3 capsules by mouth daily. , Disp: , Rfl:    pantoprazole (PROTONIX) 40 MG tablet, TOME UNA TABLETA TODOS LOS DIAS, Disp: 90 tablet, Rfl: 1   Pirfenidone (ESBRIET) 267 MG TABS, Take 3 tablets (801 mg total) by mouth 3 (three) times daily., Disp: 270 tablet, Rfl: 2   RESTASIS 0.05 % ophthalmic emulsion, 1 drop 2 (two) times daily., Disp: , Rfl:

## 2020-11-25 NOTE — Patient Instructions (Addendum)
Continue on the esbriet therapy per pharmacy orders. We will follow up your lab results from today.  We will check pulmonary function tests at your follow up in January  Continue Breo Ellipta Daily and as needed albuterol

## 2020-11-28 LAB — SPECIMEN STATUS REPORT

## 2020-11-28 LAB — ESTRADIOL: Estradiol: <5 pg/mL

## 2020-12-11 ENCOUNTER — Other Ambulatory Visit (HOSPITAL_COMMUNITY): Payer: Self-pay

## 2020-12-15 ENCOUNTER — Other Ambulatory Visit (HOSPITAL_COMMUNITY): Payer: Self-pay

## 2020-12-22 ENCOUNTER — Encounter: Payer: Self-pay | Admitting: Internal Medicine

## 2020-12-22 DIAGNOSIS — R8290 Unspecified abnormal findings in urine: Secondary | ICD-10-CM

## 2020-12-23 NOTE — Telephone Encounter (Signed)
Made the patient an appointment for Wednesday 10.12.22 at 2pm for UTI

## 2020-12-24 ENCOUNTER — Other Ambulatory Visit: Payer: Self-pay

## 2020-12-24 ENCOUNTER — Encounter: Payer: Self-pay | Admitting: Internal Medicine

## 2020-12-24 ENCOUNTER — Ambulatory Visit: Payer: Medicare PPO | Admitting: Internal Medicine

## 2020-12-24 VITALS — BP 122/90 | HR 104 | Temp 98.5°F | Resp 18 | Ht 62.5 in | Wt 137.4 lb

## 2020-12-24 DIAGNOSIS — N390 Urinary tract infection, site not specified: Secondary | ICD-10-CM

## 2020-12-24 DIAGNOSIS — R739 Hyperglycemia, unspecified: Secondary | ICD-10-CM

## 2020-12-24 DIAGNOSIS — R8290 Unspecified abnormal findings in urine: Secondary | ICD-10-CM | POA: Diagnosis not present

## 2020-12-24 LAB — URINALYSIS, ROUTINE W REFLEX MICROSCOPIC
Bilirubin Urine: NEGATIVE
Hgb urine dipstick: NEGATIVE
Ketones, ur: NEGATIVE
Nitrite: NEGATIVE
Specific Gravity, Urine: 1.015 (ref 1.000–1.030)
Total Protein, Urine: NEGATIVE
Urine Glucose: NEGATIVE
Urobilinogen, UA: 0.2 (ref 0.0–1.0)
pH: 6 (ref 5.0–8.0)

## 2020-12-24 MED ORDER — TRIMETHOPRIM 100 MG PO TABS: ORAL_TABLET | ORAL | 3 refills | Status: DC

## 2020-12-24 MED ORDER — SULFAMETHOXAZOLE-TRIMETHOPRIM 800-160 MG PO TABS: 1.0000 | ORAL_TABLET | Freq: Two times a day (BID) | ORAL | 0 refills | Status: DC

## 2020-12-24 NOTE — Progress Notes (Signed)
Patient ID: Donna [place], female   DOB: 1950/11/04, 70 y.o.   MRN: [id number]        Chief Complaint: follow up dysuria, HLD and hyperglycemia        HPI:  Donna [place] is a 70 y.o. female here with c/o another episode 4th time this yr of dysuria for several days and cloud, bad odor, wondering if can do anything to prevent this from recurring.  Denies urinary symptoms such as frequency, urgency, flank pain, hematuria or n/v, fever, chills.  Pt denies chest pain, increased sob or doe, wheezing, orthopnea, PND, increased LE swelling, palpitations, dizziness or syncope.   Pt denies polydipsia, polyuria, or new focal neuro s/s.   Pt denies fever, wt loss, night sweats, loss of appetite, or other constitutional symptoms    Wt Readings from Last 3 Encounters:  12/24/20 137 lb 6.4 oz (62.3 kg)  11/25/20 138 lb (62.6 kg)  11/04/20 136 lb 9.6 oz (62 kg)   BP Readings from Last 3 Encounters:  12/24/20 122/90  11/25/20 112/70  11/04/20 112/68         Past Medical History:  Diagnosis Date   ALLERGIC RHINITIS 05/19/2009   Anemia, iron deficiency 06/09/2015   B12 deficiency 08/02/2018   Chagas' disease 60/60/0459   Complication of anesthesia    GERD (gastroesophageal reflux disease)    Headache(784.0) 05/19/2009   Past Surgical History:  Procedure Laterality Date   ANTERIOR AND POSTERIOR REPAIR WITH SACROSPINOUS FIXATION N/A 05/16/2014   Procedure: ANTERIOR AND POSTERIOR REPAIR WITH SACROSPINOUS LIGAMENT SUSPENSION;  Surgeon: Luz Lex, MD;  Location: [facility];  Service: Gynecology;  Laterality: N/A;   BARTHOLIN CYST MARSUPIALIZATION     BUNIONECTOMY Right    LAPAROSCOPIC ASSISTED VAGINAL HYSTERECTOMY N/A 05/16/2014   Procedure: LAPAROSCOPIC ASSISTED VAGINAL HYSTERECTOMY;  Surgeon: Luz Lex, MD;  Location: [facility];  Service: Gynecology;  Laterality: N/A;   NEUROMA SURGERY Right    2nd interspace    reports that she has never smoked. She has never used smokeless tobacco. She reports that  she does not drink alcohol and does not use drugs. family history includes Alcohol abuse in her father; Hypertension in her mother; Prostate cancer in her maternal uncle. Allergies  Allergen Reactions   Codeine Nausea And Vomiting   Oxycontin [Oxycodone Hcl] Nausea Only and Other (See Comments)    FELT LIKE I WAS GOING TO FAINT   Statins Other (See Comments)    myalgias   Ibuprofen Other (See Comments)    Other reaction(s): GI Bleed   Current Outpatient Medications on File Prior to Visit  Medication Sig Dispense Refill   acetaminophen (TYLENOL) 500 MG tablet Take 500 mg by mouth every 6 (six) hours as needed.     albuterol (VENTOLIN HFA) 108 (90 Base) MCG/ACT inhaler Inhale 2 puffs into the lungs every 6 (six) hours as needed for wheezing or shortness of breath. 8 g 5   Ascorbic Acid (VITAMIN C) 250 MG CHEW Chew 250 mg by mouth daily.     calcium carbonate (OS-CAL) 600 MG TABS tablet Take 600 mg by mouth daily.     cetirizine-pseudoephedrine (ZYRTEC-D) 5-120 MG per tablet Take 1 tablet by mouth once as needed for allergies.     Cholecalciferol (VITAMIN D-3 PO) Take 4,000 Units by mouth daily.     CRANBERRY PO Take by mouth.     estradiol (ESTRACE) 0.1 MG/GM vaginal cream Place 1 Applicatorful vaginally 2 (two) times a week.  Estradiol-Estriol-Progesterone (BIEST/PROGESTERONE) CREA Apply 2 clicks daily, except on Sunday. 40 g PRN   fluticasone furoate-vilanterol (BREO ELLIPTA) 100-25 MCG/INH AEPB Inhale 1 puff into the lungs daily. 60 each 3   Glucosamine-Chondroitin (GLUCOSAMINE CHONDR COMPLEX PO) Take 1 tablet by mouth daily.     Omega-3 Fatty Acids (FISH OIL) 1000 MG CAPS Take 3 capsules by mouth daily.      pantoprazole (PROTONIX) 40 MG tablet TOME UNA TABLETA TODOS LOS DIAS 90 tablet 1   Pirfenidone (ESBRIET) 267 MG TABS Take 3 tablets (801 mg total) by mouth 3 (three) times daily. 270 tablet 2   RESTASIS 0.05 % ophthalmic emulsion 1 drop 2 (two) times daily.     Vitamin A 2400  MCG (8000 UT) CAPS Take 2,400 mcg by mouth daily.     Vitamin E 180 MG (400 UNIT) CAPS Take 180 mg by mouth daily.     No current facility-administered medications on file prior to visit.        ROS:  All others reviewed and negative.  Objective        PE:  BP 122/90   Pulse (!) 104   Temp 98.5 F (36.9 C) (Oral)   Resp 18   Ht 5' 2.5" (1.588 m)   Wt 137 lb 6.4 oz (62.3 kg)   SpO2 92%   BMI 24.73 kg/m                 Constitutional: Pt appears in NAD               HENT: Head: NCAT.                Right Ear: External ear normal.                 Left Ear: External ear normal.                Eyes: . Pupils are equal, round, and reactive to light. Conjunctivae and EOM are normal               Nose: without d/c or deformity               Neck: Neck supple. Gross normal ROM               Cardiovascular: Normal rate and regular rhythm.                 Pulmonary/Chest: Effort normal and breath sounds without rales or wheezing.                Abd:  Soft, mild low mid abd tender, ND, + BS, no organomegaly               Neurological: Pt is alert. At baseline orientation, motor grossly intact               Skin: Skin is warm. No rashes, no other new lesions, LE edema - none               Psychiatric: Pt behavior is normal without agitation   Micro: none  Cardiac tracings I have personally interpreted today:  none  Pertinent Radiological findings (summarize): none   Lab Results  Component Value Date   WBC 9.0 07/25/2020   HGB 12.8 07/25/2020   HCT 37.4 07/25/2020   PLT 284.0 07/25/2020   GLUCOSE 116 (H) 12/25/2020   CHOL 217 (H) 04/15/2020   TRIG 302.0 (H) 04/15/2020   HDL 47.00 04/15/2020  LDLDIRECT 149.0 04/15/2020   LDLCALC 124 (H) 08/01/2018   ALT 29 12/25/2020   AST 31 12/25/2020   NA 137 12/25/2020   K 4.3 12/25/2020   CL 101 12/25/2020   CREATININE 0.83 12/25/2020   BUN 20 12/25/2020   CO2 30 12/25/2020   TSH 1.40 04/15/2020   HGBA1C 6.2 04/15/2020    Assessment/Plan:  Donna [place] is a 70 y.o. White or Caucasian [1] female with  has a past medical history of ALLERGIC RHINITIS (05/19/2009), Anemia, iron deficiency (06/09/2015), B12 deficiency (08/02/2018), Chagas' disease (07/37/1062), Complication of anesthesia, GERD (gastroesophageal reflux disease), and Headache(784.0) (05/19/2009).  Hyperglycemia Lab Results  Component Value Date   HGBA1C 6.2 04/15/2020   Stable, pt to continue current medical treatment  - diet   Bad odor of urine C/w probable uti, for urine cx and empiric acute course antibx  Recurrent UTI Ok for trimethoprim post acute course antibx,  to f/u any worsening symptoms or concerns  Followup: Return if symptoms worsen or fail to improve.  Cathlean Cower, MD 12/27/2020 3:55 PM [facility]

## 2020-12-24 NOTE — Telephone Encounter (Signed)
Ok to go to lab prior to visit - order is done

## 2020-12-24 NOTE — Patient Instructions (Signed)
Please take all new medication as prescribed - the septra antibiotic  Please take all new medication as prescribed - the trimethoprim for AFTER the septra is done in 10 days  Please continue all other medications as before, and refills have been done if requested.  Please have the pharmacy call with any other refills you may need.  Please continue your efforts at being more active, low cholesterol diet, and weight control.  Please keep your appointments with your specialists as you may have planned  Please go to the LAB at the blood drawing area for the tests to be done - just the urine testing today  You will be contacted by phone if any changes need to be made immediately.  Otherwise, you will receive a letter about your results with an explanation, but please check with MyChart first.  Please remember to sign up for MyChart if you have not done so, as this will be important to you in the future with finding out test results, communicating by private email, and scheduling acute appointments online when needed.

## 2020-12-25 ENCOUNTER — Other Ambulatory Visit (INDEPENDENT_AMBULATORY_CARE_PROVIDER_SITE_OTHER): Payer: Medicare PPO

## 2020-12-25 ENCOUNTER — Other Ambulatory Visit: Payer: Self-pay | Admitting: Pharmacist

## 2020-12-25 DIAGNOSIS — Z5181 Encounter for therapeutic drug level monitoring: Secondary | ICD-10-CM | POA: Diagnosis not present

## 2020-12-25 LAB — COMPREHENSIVE METABOLIC PANEL
ALT: 29 U/L (ref 0–35)
AST: 31 U/L (ref 0–37)
Albumin: 4.5 g/dL (ref 3.5–5.2)
Alkaline Phosphatase: 67 U/L (ref 39–117)
BUN: 20 mg/dL (ref 6–23)
CO2: 30 mEq/L (ref 19–32)
Calcium: 9.8 mg/dL (ref 8.4–10.5)
Chloride: 101 mEq/L (ref 96–112)
Creatinine, Ser: 0.83 mg/dL (ref 0.40–1.20)
GFR: 70.27 mL/min (ref >60.00)
Glucose, Bld: 116 mg/dL — ABNORMAL HIGH (ref 70–99)
Potassium: 4.3 mEq/L (ref 3.5–5.1)
Sodium: 137 mEq/L (ref 135–145)
Total Bilirubin: 0.3 mg/dL (ref 0.2–1.2)
Total Protein: 7.5 g/dL (ref 6.0–8.3)

## 2020-12-25 NOTE — Progress Notes (Signed)
CM[facility] lab order placed today for Esbriet monitoring  Knox Saliva, PharmD, MPH, BCPS Clinical Pharmacist (Rheumatology and Pulmonology)

## 2020-12-26 ENCOUNTER — Encounter: Payer: Self-pay | Admitting: Internal Medicine

## 2020-12-26 LAB — URINE CULTURE

## 2020-12-27 ENCOUNTER — Encounter: Payer: Self-pay | Admitting: Internal Medicine

## 2020-12-27 NOTE — Assessment & Plan Note (Signed)
[place] for trimethoprim post acute course antibx,  to f/u any worsening symptoms or concerns

## 2020-12-27 NOTE — Assessment & Plan Note (Signed)
C/w probable uti, for urine cx and empiric acute course antibx

## 2020-12-27 NOTE — Assessment & Plan Note (Signed)
Lab Results  Component Value Date   HGBA1C 6.2 04/15/2020   Stable, pt to continue current medical treatment  - diet

## 2020-12-29 NOTE — Telephone Encounter (Signed)
JD please advise. Thanks

## 2021-01-01 NOTE — Telephone Encounter (Signed)
JD please advise. Thanks  I am taking  an antibiotic  sulfamethoxazole-ole-temp DS tablet twice a day for UTI prescribed by my pcp .do you think that  this antibiotic would interact with Esdriet  y make me feel terrible .

## 2021-01-02 ENCOUNTER — Telehealth: Payer: Self-pay | Admitting: Pulmonary Disease

## 2021-01-02 NOTE — Telephone Encounter (Signed)
Pt states she called yesterday about her sinus infection and no one has contacted her. Has had infection for about a week.  Pharmacy is [facility] off of [address]. Please advise as pt is very upset that no one has contacted her (there are several [place] encounters)

## 2021-01-02 NOTE — Telephone Encounter (Signed)
I am taking  an antibiotic  sulfamethoxazole-ole-temp DS tablet twice a day for UTI prescribed by my pcp .do you think that  this antibiotic would interact with Esdriet  y make me feel terrible .     Pt also stated that she will finishe the sulfmethoxazole tomorrow for her UTI.   She is now having nasal congestion with yellow discharge, headache, nausea at times and have vomited from coughing, sometimes she can cough up yellow sputum, PND, and head pressure.  Going on for about a week per pt.  She has not tried any OTC meds for this.  She is requesting that something be called to the pharmacy.  Please advise.  JD is off today.  Will forward to EW for recs.   Allergies  Allergen Reactions   Codeine Nausea And Vomiting   Oxycontin [Oxycodone Hcl] Nausea Only and Other (See Comments)    FELT LIKE I WAS GOING TO FAINT   Statins Other (See Comments)    myalgias   Ibuprofen Other (See Comments)    Other reaction(s): GI Bleed

## 2021-01-02 NOTE — Telephone Encounter (Signed)
Called and spoke with pt letting her know recs stated by [facility] and she verbalized understanding. Nothing further needed.

## 2021-01-02 NOTE — Telephone Encounter (Signed)
There's no interaction with Esbriet or Bactrim found. For nasal congestion she should start taking mucinex 62m twice daily, flonase nasal spray 1 puff per nostril daily and saline nasal irrigation twice daily.

## 2021-01-08 ENCOUNTER — Other Ambulatory Visit (HOSPITAL_COMMUNITY): Payer: Self-pay

## 2021-01-09 ENCOUNTER — Other Ambulatory Visit: Payer: Self-pay | Admitting: Internal Medicine

## 2021-01-09 NOTE — Telephone Encounter (Signed)
Please refill as per office routine med refill policy (all routine meds to be refilled for 3 mo or monthly (per pt preference) up to one year from last visit, then month to month grace period for 3 mo, then further med refills will have to be denied)

## 2021-01-13 ENCOUNTER — Other Ambulatory Visit (HOSPITAL_COMMUNITY): Payer: Self-pay

## 2021-01-16 ENCOUNTER — Other Ambulatory Visit (HOSPITAL_COMMUNITY): Payer: Self-pay

## 2021-01-19 ENCOUNTER — Other Ambulatory Visit: Payer: Self-pay | Admitting: Pulmonary Disease

## 2021-01-19 DIAGNOSIS — R0602 Shortness of breath: Secondary | ICD-10-CM

## 2021-01-22 ENCOUNTER — Other Ambulatory Visit (INDEPENDENT_AMBULATORY_CARE_PROVIDER_SITE_OTHER): Payer: Medicare PPO

## 2021-01-22 ENCOUNTER — Other Ambulatory Visit: Payer: Self-pay | Admitting: *Deleted

## 2021-01-22 DIAGNOSIS — Z5181 Encounter for therapeutic drug level monitoring: Secondary | ICD-10-CM

## 2021-01-22 LAB — COMPREHENSIVE METABOLIC PANEL
ALT: 26 U/L (ref 0–35)
AST: 26 U/L (ref 0–37)
Albumin: 4.6 g/dL (ref 3.5–5.2)
Alkaline Phosphatase: 61 U/L (ref 39–117)
BUN: 24 mg/dL — ABNORMAL HIGH (ref 6–23)
CO2: 30 mEq/L (ref 19–32)
Calcium: 10.2 mg/dL (ref 8.4–10.5)
Chloride: 101 mEq/L (ref 96–112)
Creatinine, Ser: 0.73 mg/dL (ref 0.40–1.20)
GFR: 81.93 mL/min (ref >60.00)
Glucose, Bld: 118 mg/dL — ABNORMAL HIGH (ref 70–99)
Potassium: 4.4 mEq/L (ref 3.5–5.1)
Sodium: 138 mEq/L (ref 135–145)
Total Bilirubin: 0.3 mg/dL (ref 0.2–1.2)
Total Protein: 7.5 g/dL (ref 6.0–8.3)

## 2021-02-02 DIAGNOSIS — N3020 Other chronic cystitis without hematuria: Secondary | ICD-10-CM | POA: Diagnosis not present

## 2021-02-02 DIAGNOSIS — N952 Postmenopausal atrophic vaginitis: Secondary | ICD-10-CM | POA: Diagnosis not present

## 2021-02-10 ENCOUNTER — Other Ambulatory Visit (HOSPITAL_COMMUNITY): Payer: Self-pay

## 2021-02-12 ENCOUNTER — Other Ambulatory Visit (HOSPITAL_COMMUNITY): Payer: Self-pay

## 2021-02-16 ENCOUNTER — Other Ambulatory Visit: Payer: Self-pay | Admitting: Obstetrics and Gynecology

## 2021-02-16 DIAGNOSIS — Z1231 Encounter for screening mammogram for malignant neoplasm of breast: Secondary | ICD-10-CM

## 2021-02-17 ENCOUNTER — Other Ambulatory Visit (INDEPENDENT_AMBULATORY_CARE_PROVIDER_SITE_OTHER): Payer: Medicare PPO

## 2021-02-17 DIAGNOSIS — Z5181 Encounter for therapeutic drug level monitoring: Secondary | ICD-10-CM

## 2021-02-17 LAB — COMPREHENSIVE METABOLIC PANEL
ALT: 23 U/L (ref 0–35)
AST: 23 U/L (ref 0–37)
Albumin: 4.4 g/dL (ref 3.5–5.2)
Alkaline Phosphatase: 58 U/L (ref 39–117)
BUN: 21 mg/dL (ref 6–23)
CO2: 28 mEq/L (ref 19–32)
Calcium: 9.5 mg/dL (ref 8.4–10.5)
Chloride: 106 mEq/L (ref 96–112)
Creatinine, Ser: 0.67 mg/dL (ref 0.40–1.20)
GFR: 87.03 mL/min (ref >60.00)
Glucose, Bld: 106 mg/dL — ABNORMAL HIGH (ref 70–99)
Potassium: 3.9 mEq/L (ref 3.5–5.1)
Sodium: 140 mEq/L (ref 135–145)
Total Bilirubin: 0.3 mg/dL (ref 0.2–1.2)
Total Protein: 7.1 g/dL (ref 6.0–8.3)

## 2021-03-10 ENCOUNTER — Other Ambulatory Visit: Payer: Self-pay | Admitting: Pulmonary Disease

## 2021-03-10 ENCOUNTER — Other Ambulatory Visit (HOSPITAL_COMMUNITY): Payer: Self-pay

## 2021-03-10 DIAGNOSIS — J849 Interstitial pulmonary disease, unspecified: Secondary | ICD-10-CM

## 2021-03-10 MED ORDER — PIRFENIDONE 267 MG PO TABS
801.0000 mg | ORAL_TABLET | Freq: Three times a day (TID) | ORAL | 2 refills | Status: DC
  Filled 2021-03-10: qty 270, 30d supply, fill #0
  Filled 2021-04-06: qty 270, 30d supply, fill #1

## 2021-03-13 ENCOUNTER — Other Ambulatory Visit (HOSPITAL_COMMUNITY): Payer: Self-pay

## 2021-03-19 ENCOUNTER — Other Ambulatory Visit (INDEPENDENT_AMBULATORY_CARE_PROVIDER_SITE_OTHER): Payer: Medicare Other

## 2021-03-19 DIAGNOSIS — Z5181 Encounter for therapeutic drug level monitoring: Secondary | ICD-10-CM

## 2021-03-19 LAB — COMPREHENSIVE METABOLIC PANEL
ALT: 25 U/L (ref 0–35)
AST: 26 U/L (ref 0–37)
Albumin: 4.5 g/dL (ref 3.5–5.2)
Alkaline Phosphatase: 53 U/L (ref 39–117)
BUN: 20 mg/dL (ref 6–23)
CO2: 30 mEq/L (ref 19–32)
Calcium: 9.9 mg/dL (ref 8.4–10.5)
Chloride: 103 mEq/L (ref 96–112)
Creatinine, Ser: 0.8 mg/dL (ref 0.40–1.20)
GFR: 73.33 mL/min (ref >60.00)
Glucose, Bld: 87 mg/dL (ref 70–99)
Potassium: 4 mEq/L (ref 3.5–5.1)
Sodium: 139 mEq/L (ref 135–145)
Total Bilirubin: 0.3 mg/dL (ref 0.2–1.2)
Total Protein: 7.6 g/dL (ref 6.0–8.3)

## 2021-04-06 ENCOUNTER — Other Ambulatory Visit (HOSPITAL_COMMUNITY): Payer: Self-pay

## 2021-04-08 ENCOUNTER — Encounter: Payer: Self-pay | Admitting: Pulmonary Disease

## 2021-04-08 ENCOUNTER — Other Ambulatory Visit: Payer: Self-pay

## 2021-04-08 ENCOUNTER — Ambulatory Visit: Payer: Medicare Other | Admitting: Pulmonary Disease

## 2021-04-08 ENCOUNTER — Other Ambulatory Visit (HOSPITAL_COMMUNITY): Payer: Self-pay

## 2021-04-08 ENCOUNTER — Telehealth: Payer: Self-pay | Admitting: Pharmacist

## 2021-04-08 VITALS — BP 112/70 | HR 93 | Ht 62.5 in | Wt 138.0 lb

## 2021-04-08 DIAGNOSIS — J84112 Idiopathic pulmonary fibrosis: Secondary | ICD-10-CM

## 2021-04-08 MED ORDER — PIRFENIDONE 801 MG PO TABS
801.0000 mg | ORAL_TABLET | Freq: Three times a day (TID) | ORAL | 1 refills | Status: DC
  Filled 2021-04-08: qty 90, 30d supply, fill #0
  Filled 2021-05-05: qty 90, 30d supply, fill #1
  Filled 2021-06-09: qty 90, 30d supply, fill #2
  Filled 2021-07-03: qty 90, 30d supply, fill #3
  Filled 2021-08-05: qty 90, 30d supply, fill #4
  Filled 2021-09-01: qty 90, 30d supply, fill #5

## 2021-04-08 NOTE — Patient Instructions (Addendum)
Continue taking pirfenidone 3 times daily  I will check with our pharmacy team about the prescription so we can consolidate to the one 843m tab, 3 times daily  Follow up in July with pulmonary function tests and CT Chest scan

## 2021-04-08 NOTE — Progress Notes (Signed)
Synopsis: Referred in July 202 for shortness of breath by Cathlean Cower, MD  Subjective:   PATIENT ID: Donna Holmes GENDER: female DOB: Oct 13, 1950, MRN: [id number]  HPI  Chief Complaint  Patient presents with   Follow-up    4 mo f/u. States she has been doing well since last visit.    Donna Holmes is a 71 year old woman, never smoker who returns to pulmonary clinic for follow up of pulmonary fibrosis.  Patient was started on Esbriet therapy in August 2022.  Needs to tolerate the medicine okay.  She is eating more in order to reduce any GI side effects from the [place] which seems to be working.  She reports her sense of taste is absent.  She is interested in learning more about investigational studies regarding pulmonary fibrosis.  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 She met with our pharmacy team and has been started on esbriet therapy. She is currently taking esbriet 3 x 275m tablet, three times daily. She has been ordered 8055mtablets to be taken 3 times daily and is awaiting this prescription. She complains of stomch discomfort since taking the medication which is alleviated by eating food. She also complains of headaches every other day that are relieved by acetaminophen.   She is walking 2 miles per day. She is using breo ellipta daily and as needed albuterol with relief.   OV 09/18/20 She has noticed shortness of breath for a few months with some wheezing, dry cough and chest tightness. The dyspnea is most notable with exertion. She denies any recent infections and has not had covid. She denies any respiratory issues in childhood.   She does report reflux symptoms and has been taking pantoprazole 4076maily. She denies any sinus congestion and post-nasal drainage. She denies any muscle aches, morning stiffness of skin rashes.  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/3491Complication of anesthesia    GERD (gastroesophageal reflux disease)    Headache(784.0) 05/19/2009     Family History  Problem Relation Age of Onset   Alcohol abuse Father    Hypertension Mother    Prostate cancer Maternal Uncle      Social History   Socioeconomic History   Marital status: Married    Spouse name: Not on file   Number of children: 1   Years of education: Not on file   Highest education level: Not on file  Occupational History   Occupation: nurse retired  Tobacco Use   Smoking status: Never   Smokeless tobacco: Never  Vaping Use   Vaping Use: Never used  Substance and Sexual Activity   Alcohol use: No   Drug use: No   Sexual activity: Not Currently  Other Topics Concern   Not on file  Social History Narrative   Not on file   Social Determinants of Health   Financial Resource Strain: Not on file  Food Insecurity: Not on file  Transportation Needs: Not on file  Physical Activity: Not on file  Stress: Not on file  Social Connections: Not on file  Intimate Partner Violence: Not on file     Allergies  Allergen Reactions   Codeine Nausea And Vomiting   Oxycontin [Oxycodone Hcl] Nausea Only and Other (See Comments)  FELT LIKE I WAS GOING TO FAINT   Statins Other (See Comments)    myalgias   Ibuprofen Other (See Comments)    Other reaction(s): GI Bleed     Outpatient Medications Prior to Visit  Medication Sig Dispense Refill   acetaminophen (TYLENOL) 500 MG tablet Take 500 mg by mouth every 6 (six) hours as needed.     albuterol (VENTOLIN HFA) 108 (90 Base) MCG/ACT inhaler Inhale 2 puffs into the lungs every 6 (six) hours as needed for wheezing or shortness of breath. 8 g 5   Ascorbic Acid (VITAMIN C) 250 MG CHEW Chew 250 mg by mouth daily.     calcium carbonate (OS-CAL) 600 MG TABS tablet Take 600 mg by  mouth daily.     cetirizine-pseudoephedrine (ZYRTEC-D) 5-120 MG per tablet Take 1 tablet by mouth once as needed for allergies.     Cholecalciferol (VITAMIN D-3 PO) Take 4,000 Units by mouth daily.     CRANBERRY PO Take by mouth.     estradiol (ESTRACE) 0.1 MG/GM vaginal cream Place 1 Applicatorful vaginally 2 (two) times a week.     Estradiol-Estriol-Progesterone (BIEST/PROGESTERONE) CREA Apply 2 clicks daily, except on Sunday. 40 g PRN   fluticasone furoate-vilanterol (BREO ELLIPTA) 100-25 MCG/ACT AEPB Take 1 puff by mouth once daily  1 each 3   Glucosamine-Chondroitin (GLUCOSAMINE CHONDR COMPLEX PO) Take 1 tablet by mouth daily.     Omega-3 Fatty Acids (FISH OIL) 1000 MG CAPS Take 3 capsules by mouth daily.      pantoprazole (PROTONIX) 40 MG tablet TAKE 1 TABLET BY MOUTH EVERY DAY 90 tablet 1   RESTASIS 0.05 % ophthalmic emulsion 1 drop 2 (two) times daily.     trimethoprim (TRIMPEX) 100 MG tablet 1 tab by mouth on Mon - Wed - Fri 36 tablet 3   Vitamin A 2400 MCG (8000 UT) CAPS Take 2,400 mcg by mouth daily.     Vitamin E 180 MG (400 UNIT) CAPS Take 180 mg by mouth daily.     Pirfenidone (ESBRIET) 267 MG TABS Take 3 tablets (801 mg total) by mouth 3 (three) times daily. 270 tablet 2   sulfamethoxazole-trimethoprim (BACTRIM DS) 800-160 MG tablet Take 1 tablet by mouth 2 (two) times daily. 20 tablet 0   No facility-administered medications prior to visit.    Review of Systems  Constitutional:  Negative for chills, fever, malaise/fatigue and weight loss.  HENT:  Negative for congestion, sinus pain and sore throat.   Eyes: Negative.   Respiratory:  Negative for cough, hemoptysis, sputum production, shortness of breath and wheezing.   Cardiovascular:  Negative for chest pain, palpitations, orthopnea, claudication and leg swelling.  Gastrointestinal:  Positive for abdominal pain. Negative for heartburn, nausea and vomiting.  Genitourinary: Negative.   Musculoskeletal:  Negative for joint  pain and myalgias.  Skin:  Negative for rash.  Neurological:  Positive for headaches. Negative for weakness.  Endo/Heme/Allergies: Negative.   Psychiatric/Behavioral: Negative.      Objective:   Vitals:   04/08/21 1333  BP: 112/70  Pulse: 93  SpO2: 98%  Weight: 138 lb (62.6 kg)  Height: 5' 2.5" (1.588 m)    Physical Exam Constitutional:      General: She is not in acute distress.    Appearance: She is not ill-appearing.  HENT:     Head: Normocephalic and atraumatic.  Eyes:     General: No scleral icterus.    Conjunctiva/sclera: Conjunctivae normal.     Pupils: Pupils are  equal, round, and reactive to light.  Cardiovascular:     Rate and Rhythm: Normal rate and regular rhythm.     Pulses: Normal pulses.     Heart sounds: Normal heart sounds. No murmur heard. Pulmonary:     Effort: Pulmonary effort is normal.     Breath sounds: Rales (bibasilar) present. No wheezing or rhonchi.  Abdominal:     General: Bowel sounds are normal.     Palpations: Abdomen is soft.  Musculoskeletal:     Right lower leg: No edema.     Left lower leg: No edema.  Lymphadenopathy:     Cervical: No cervical adenopathy.  Skin:    General: Skin is warm and dry.  Neurological:     General: No focal deficit present.     Mental Status: She is alert.   CBC    Component Value Date/Time   WBC 9.0 07/25/2020 1345   RBC 4.38 07/25/2020 1345   HGB 12.8 07/25/2020 1345   HGB 12.8 12/16/2017 1249   HCT 37.4 07/25/2020 1345   HCT 38.4 12/16/2017 1249   PLT 284.0 07/25/2020 1345   PLT 307 12/16/2017 1249   MCV 85.4 07/25/2020 1345   MCV 88 12/16/2017 1249   MCH 29.3 12/16/2017 1249   MCH 29.9 05/17/2014 0529   MCHC 34.3 07/25/2020 1345   RDW 14.7 07/25/2020 1345   RDW 13.7 12/16/2017 1249   LYMPHSABS 2.8 07/25/2020 1345   MONOABS 0.6 07/25/2020 1345   EOSABS 0.2 07/25/2020 1345   BASOSABS 0.1 07/25/2020 1345   BMP Latest Ref Rng & Units 03/19/2021 02/17/2021 01/22/2021  Glucose 70 - 99 mg/dL  87 106(H) 118(H)  BUN 6 - 23 mg/dL 20 21 24(H)  Creatinine 0.40 - 1.20 mg/dL 0.80 0.67 0.73  Sodium 135 - 145 mEq/L 139 140 138  Potassium 3.5 - 5.1 mEq/L 4.0 3.9 4.4  Chloride 96 - 112 mEq/L 103 106 101  CO2 19 - 32 mEq/L _0 Calcium 8.4 - 10.5 mg/dL 9.9 9.5 10.2   Chest imaging: HRCT Chest 10/03/20 Moderate patchy confluent subpleural reticulation and ground-glass opacity throughout both lungs with associated mild-to-moderate traction bronchiectasis and mild architectural distortion. There is a basilar predominance to these findings. No frank honeycombing. Findings are categorized as probable UIP per consensus guidelines.  CXR 07/25/20 Heart size and mediastinal contours are within normal limits. Study is hypoinspiratory with crowding of the perihilar and bibasilar bronchovascular markings. Given the low lung volumes, lungs are clear, possible mild atelectasis at the LEFT lung base. No pleural effusion or pneumothorax. Osseous structures about the chest are unremarkable.  PFT: PFT Results Latest Ref Rng & Units 09/23/2020  FVC-Pre L 1.82  FVC-Predicted Pre % 64  FVC-Post L 1.81  FVC-Predicted Post % 64  Pre FEV1/FVC % % 81  Post FEV1/FCV % % 82  FEV1-Pre L 1.47  FEV1-Predicted Pre % 69  FEV1-Post L 1.49  DLCO uncorrected ml/min/mmHg 13.70  DLCO UNC% % 73  DLVA Predicted % 104  TLC L 2.88  TLC % Predicted % 58  RV % Predicted % 46  PFT 2022 - Moderate restriction and minimal diffusion defect  Echo 2012: LV EF 55-60%. Grade I diastolic dysfunction.    Assessment & Plan:   IPF (idiopathic pulmonary fibrosis) ([place]) - Plan: Pulmonary Function Test, CT CHEST HIGH RESOLUTION  Discussion: Donna Holmes is a 71 year old woman, never smoker who returns to pulmonary clinic for follow up of pulmonary fibrosis.  She is  tolerating Esbriet therapy well.  She is to continue Esbriet 801 mg tablet, 3 times daily.  She is to continue breo ellipta 1 puff daily. She can  continue as needed albuterol as well.  She is to continue PPI therapy for her GERD.   Follow up in July with pulmonary function tests and repeat CT chest scan.  Freda Jackson, MD [facility] Office: [phone]   Current Outpatient Medications:    acetaminophen (TYLENOL) 500 MG tablet, Take 500 mg by mouth every 6 (six) hours as needed., Disp: , Rfl:    albuterol (VENTOLIN HFA) 108 (90 Base) MCG/ACT inhaler, Inhale 2 puffs into the lungs every 6 (six) hours as needed for wheezing or shortness of breath., Disp: 8 g, Rfl: 5   Ascorbic Acid (VITAMIN C) 250 MG CHEW, Chew 250 mg by mouth daily., Disp: , Rfl:    calcium carbonate (OS-CAL) 600 MG TABS tablet, Take 600 mg by mouth daily., Disp: , Rfl:    cetirizine-pseudoephedrine (ZYRTEC-D) 5-120 MG per tablet, Take 1 tablet by mouth once as needed for allergies., Disp: , Rfl:    Cholecalciferol (VITAMIN D-3 PO), Take 4,000 Units by mouth daily., Disp: , Rfl:    CRANBERRY PO, Take by mouth., Disp: , Rfl:    estradiol (ESTRACE) 0.1 MG/GM vaginal cream, Place 1 Applicatorful vaginally 2 (two) times a week., Disp: , Rfl:    Estradiol-Estriol-Progesterone (BIEST/PROGESTERONE) CREA, Apply 2 clicks daily, except on Sunday., Disp: 40 g, Rfl: PRN   fluticasone furoate-vilanterol (BREO ELLIPTA) 100-25 MCG/ACT AEPB, Take 1 puff by mouth once daily , Disp: 1 each, Rfl: 3   Glucosamine-Chondroitin (GLUCOSAMINE CHONDR COMPLEX PO), Take 1 tablet by mouth daily., Disp: , Rfl:    Omega-3 Fatty Acids (FISH OIL) 1000 MG CAPS, Take 3 capsules by mouth daily. , Disp: , Rfl:    pantoprazole (PROTONIX) 40 MG tablet, TAKE 1 TABLET BY MOUTH EVERY DAY, Disp: 90 tablet, Rfl: 1   RESTASIS 0.05 % ophthalmic emulsion, 1 drop 2 (two) times daily., Disp: , Rfl:    trimethoprim (TRIMPEX) 100 MG tablet, 1 tab by mouth on Mon - Wed - Fri, Disp: 36 tablet, Rfl: 3   Vitamin A 2400 MCG (8000 UT) CAPS, Take 2,400 mcg by mouth daily., Disp: , Rfl:    Vitamin  E 180 MG (400 UNIT) CAPS, Take 180 mg by mouth daily., Disp: , Rfl:    Pirfenidone (ESBRIET) 801 MG TABS, Take 1 tablet (801 mg) by mouth with breakfast, with lunch, and with evening meal., Disp: 270 tablet, Rfl: 1

## 2021-04-08 NOTE — Telephone Encounter (Signed)
-----  Message from Freddi Starr, MD sent at 04/08/2021  2:00 PM EST ----- Regarding: Esbriet dosing Hi Sharanda Shinault,  I saw this patient today and she is still receiving a prescription for esbriet 3 x 24m tablet, three times daily. I thought she was already ordered for the 8038mtablets 3 times daily at last visit.   Would you be able to look into this for me.  Thanks, JD

## 2021-04-08 NOTE — Telephone Encounter (Signed)
Test claim processed for pirfenidone 884m tablets. [facility].  Patient notified via My[facility] SalivaPharmD, MPH, BCPS Clinical Pharmacist (Rheumatology and Pulmonology)

## 2021-04-10 ENCOUNTER — Ambulatory Visit
Admission: RE | Admit: 2021-04-10 | Discharge: 2021-04-10 | Disposition: A | Discharge status: Home / Self Care | Payer: Medicare Other | Source: Ambulatory Visit | Attending: Obstetrics and Gynecology | Admitting: Obstetrics and Gynecology

## 2021-04-10 DIAGNOSIS — Z1231 Encounter for screening mammogram for malignant neoplasm of breast: Secondary | ICD-10-CM

## 2021-04-14 ENCOUNTER — Other Ambulatory Visit (HOSPITAL_COMMUNITY): Payer: Self-pay

## 2021-04-19 ENCOUNTER — Encounter: Payer: Self-pay | Admitting: Pulmonary Disease

## 2021-04-20 ENCOUNTER — Other Ambulatory Visit: Payer: Self-pay

## 2021-04-20 ENCOUNTER — Other Ambulatory Visit (INDEPENDENT_AMBULATORY_CARE_PROVIDER_SITE_OTHER): Payer: Medicare Other

## 2021-04-20 DIAGNOSIS — Z5181 Encounter for therapeutic drug level monitoring: Secondary | ICD-10-CM

## 2021-04-20 LAB — HEPATIC FUNCTION PANEL
ALT: 29 U/L (ref 0–35)
AST: 27 U/L (ref 0–37)
Albumin: 4.3 g/dL (ref 3.5–5.2)
Alkaline Phosphatase: 47 U/L (ref 39–117)
Bilirubin, Direct: 0.1 mg/dL (ref 0.0–0.3)
Total Bilirubin: 0.4 mg/dL (ref 0.2–1.2)
Total Protein: 7.3 g/dL (ref 6.0–8.3)

## 2021-04-22 ENCOUNTER — Encounter: Payer: Self-pay | Admitting: Internal Medicine

## 2021-04-22 ENCOUNTER — Other Ambulatory Visit: Payer: Self-pay

## 2021-04-22 ENCOUNTER — Ambulatory Visit: Payer: Medicare Other | Admitting: Internal Medicine

## 2021-04-22 VITALS — BP 110/66 | HR 101 | Temp 99.0°F | Ht 62.5 in | Wt 135.0 lb

## 2021-04-22 DIAGNOSIS — Z0001 Encounter for general adult medical examination with abnormal findings: Secondary | ICD-10-CM

## 2021-04-22 DIAGNOSIS — D509 Iron deficiency anemia, unspecified: Secondary | ICD-10-CM

## 2021-04-22 DIAGNOSIS — R739 Hyperglycemia, unspecified: Secondary | ICD-10-CM

## 2021-04-22 DIAGNOSIS — E785 Hyperlipidemia, unspecified: Secondary | ICD-10-CM | POA: Diagnosis not present

## 2021-04-22 DIAGNOSIS — R8290 Unspecified abnormal findings in urine: Secondary | ICD-10-CM | POA: Diagnosis not present

## 2021-04-22 DIAGNOSIS — E538 Deficiency of other specified B group vitamins: Secondary | ICD-10-CM

## 2021-04-22 DIAGNOSIS — E559 Vitamin D deficiency, unspecified: Secondary | ICD-10-CM

## 2021-04-22 LAB — CBC WITH DIFFERENTIAL/PLATELET
Basophils Absolute: 0.1 10*3/uL (ref 0.0–0.1)
Basophils Relative: 1.1 % (ref 0.0–3.0)
Eosinophils Absolute: 0.1 10*3/uL (ref 0.0–0.7)
Eosinophils Relative: 1.7 % (ref 0.0–5.0)
HCT: 36.9 % (ref 36.0–46.0)
Hemoglobin: 12 g/dL (ref 12.0–15.0)
Lymphocytes Relative: 33.6 % (ref 12.0–46.0)
Lymphs Abs: 2.6 10*3/uL (ref 0.7–4.0)
MCHC: 32.7 g/dL (ref 30.0–36.0)
MCV: 86.8 fl (ref 78.0–100.0)
Monocytes Absolute: 0.7 10*3/uL (ref 0.1–1.0)
Monocytes Relative: 9.5 % (ref 3.0–12.0)
Neutro Abs: 4.2 10*3/uL (ref 1.4–7.7)
Neutrophils Relative %: 54.1 % (ref 43.0–77.0)
Platelets: 328 10*3/uL (ref 150.0–400.0)
RBC: 4.25 Mil/uL (ref 3.87–5.11)
RDW: 14.9 % (ref 11.5–15.5)
WBC: 7.7 10*3/uL (ref 4.0–10.5)

## 2021-04-22 LAB — BASIC METABOLIC PANEL
BUN: 25 mg/dL — ABNORMAL HIGH (ref 6–23)
CO2: 27 mEq/L (ref 19–32)
Calcium: 10 mg/dL (ref 8.4–10.5)
Chloride: 102 mEq/L (ref 96–112)
Creatinine, Ser: 0.85 mg/dL (ref 0.40–1.20)
GFR: 68.14 mL/min (ref >60.00)
Glucose, Bld: 90 mg/dL (ref 70–99)
Potassium: 4.4 mEq/L (ref 3.5–5.1)
Sodium: 140 mEq/L (ref 135–145)

## 2021-04-22 LAB — VITAMIN D 25 HYDROXY (VIT D DEFICIENCY, FRACTURES): VITD: 63.99 ng/mL (ref 30.00–100.00)

## 2021-04-22 LAB — HEMOGLOBIN A1C: Hgb A1c MFr Bld: 6 % (ref 4.6–6.5)

## 2021-04-22 LAB — LIPID PANEL
Cholesterol: 274 mg/dL — ABNORMAL HIGH (ref 0–200)
HDL: 45.4 mg/dL (ref >39.00)
NonHDL: 228.47
Total CHOL/HDL Ratio: 6
Triglycerides: 288 mg/dL — ABNORMAL HIGH (ref 0.0–149.0)
VLDL: 57.6 mg/dL — ABNORMAL HIGH (ref 0.0–40.0)

## 2021-04-22 LAB — URINALYSIS, ROUTINE W REFLEX MICROSCOPIC
Bilirubin Urine: NEGATIVE
Hgb urine dipstick: NEGATIVE
Leukocytes,Ua: NEGATIVE
Nitrite: POSITIVE — AB
RBC / HPF: NONE SEEN (ref 0–?)
Specific Gravity, Urine: 1.025 (ref 1.000–1.030)
Total Protein, Urine: NEGATIVE
Urine Glucose: NEGATIVE
Urobilinogen, UA: 0.2 (ref 0.0–1.0)
pH: 5.5 (ref 5.0–8.0)

## 2021-04-22 LAB — VITAMIN B12: Vitamin B-12: 267 pg/mL (ref 211–911)

## 2021-04-22 LAB — LDL CHOLESTEROL, DIRECT: Direct LDL: 194 mg/dL

## 2021-04-22 LAB — TSH: TSH: 1.42 u[IU]/mL (ref 0.35–5.50)

## 2021-04-22 NOTE — Patient Instructions (Signed)
Please continue all other medications as before, and refills have been done if requested.  Please have the pharmacy call with any other refills you may need.  Please continue your efforts at being more active, low cholesterol diet, and weight control.  You are otherwise up to date with prevention measures today.  Please keep your appointments with your specialists as you may have planned  Please go to the LAB at the blood drawing area for the tests to be done  You will be contacted by phone if any changes need to be made immediately.  Otherwise, you will receive a letter about your results with an explanation, but please check with MyChart first.  Please remember to sign up for MyChart if you have not done so, as this will be important to you in the future with finding out test results, communicating by private email, and scheduling acute appointments online when needed.  Please make an Appointment to return in 6 months, or sooner if needed

## 2021-04-22 NOTE — Assessment & Plan Note (Signed)
?  UTI - for urine studies,  to f/u any worsening symptoms or concerns

## 2021-04-22 NOTE — Assessment & Plan Note (Signed)
Lab Results  Component Value Date   VITAMINB12 267 04/22/2021   Stable, cont oral replacement - b12 1000 mcg qd

## 2021-04-22 NOTE — Assessment & Plan Note (Signed)
Lab Results  Component Value Date   WBC 7.7 04/22/2021   HGB 12.0 04/22/2021   HCT 36.9 04/22/2021   MCV 86.8 04/22/2021   PLT 328.0 04/22/2021   Stable, cont to monitor

## 2021-04-22 NOTE — Progress Notes (Signed)
Patient ID: Donna Holmes, female   DOB: 1950-04-24, 71 y.o.   MRN: [id number]         Chief Complaint:: wellness exam and Follow-up  Iron deficiency, b12 def, bad urine odor, hyperglycemia, hld,        HPI:  Donna Holmes is a 71 y.o. female here for wellness exam; declines shingrix and tdap, o/w up to date; Also due for f/u DXA next month.       Also, Pt denies chest pain, increased sob or doe, wheezing, orthopnea, PND, increased LE swelling, palpitations, dizziness or syncope.  No overt bleeding, bruising,  Pt denies polydipsia, polyuria, or new focal neuro s/s.   Pt denies fever, wt loss, night sweats, loss of appetite, or other constitutional symptoms  Denies urinary symptoms such as dysuria, frequency, urgency, flank pain, hematuria or n/v, fever, chills, but had 1 wk of bad odor of urine again, thinks she may have another UTI as before.        Wt Readings from Last 3 Encounters:  04/22/21 135 lb (61.2 kg)  04/08/21 138 lb (62.6 kg)  12/24/20 137 lb 6.4 oz (62.3 kg)   BP Readings from Last 3 Encounters:  04/22/21 110/66  04/08/21 112/70  12/24/20 122/90   Immunization History  Administered Date(s) Administered   Fluad Quad(high Dose 65+) 12/13/2018, 01/16/2020   Influenza, High Dose Seasonal PF 01/15/2021   Influenza-Unspecified 12/02/2015, 12/21/2016, 12/28/2017   PFIZER Comirnaty(Gray Top)Covid-19 Tri-Sucrose Vaccine 07/24/2020   PFIZER(Purple Top)SARS-COV-2 Vaccination 04/22/2019, 05/17/2019, 12/23/2019   Pfizer Covid-19 Vaccine Bivalent Booster 4yr & up 03/02/2021   Pneumococcal Conjugate-13 01/29/2014   Pneumococcal Polysaccharide-23 05/06/2017   Td 05/19/2009   There are no preventive care reminders to display for this patient.     Past Medical History:  Diagnosis Date   ALLERGIC RHINITIS 05/19/2009   Anemia, iron deficiency 06/09/2015   B12 deficiency 08/02/2018   Chagas' disease 133/54/5625  Complication of anesthesia    GERD (gastroesophageal reflux  disease)    Headache(784.0) 05/19/2009   Past Surgical History:  Procedure Laterality Date   ANTERIOR AND POSTERIOR REPAIR WITH SACROSPINOUS FIXATION N/A 05/16/2014   Procedure: ANTERIOR AND POSTERIOR REPAIR WITH SACROSPINOUS LIGAMENT SUSPENSION;  Surgeon: DLuz Lex MD;  [facility];  Service: Gynecology;  Laterality: N/A;   BARTHOLIN CYST MARSUPIALIZATION     BUNIONECTOMY Right    LAPAROSCOPIC ASSISTED VAGINAL HYSTERECTOMY N/A 05/16/2014   Procedure: LAPAROSCOPIC ASSISTED VAGINAL HYSTERECTOMY;  Surgeon: DLuz Lex MD;  [facility];  Service: Gynecology;  Laterality: N/A;   NEUROMA SURGERY Right    2nd interspace    reports that she has never smoked. She has never used smokeless tobacco. She reports that she does not drink alcohol and does not use drugs. family history includes Alcohol abuse in her father; Hypertension in her mother; Prostate cancer in her maternal uncle. Allergies  Allergen Reactions   Aspirin    Codeine Nausea And Vomiting   Oxycontin [Oxycodone Hcl] Nausea Only and Other (See Comments)    FELT LIKE I WAS GOING TO FAINT   Statins Other (See Comments)    myalgias   Sulfa Antibiotics Nausea And Vomiting   Ibuprofen Other (See Comments)    Other reaction(s): GI Bleed   Current Outpatient Medications on File Prior to Visit  Medication Sig Dispense Refill   acetaminophen (TYLENOL) 500 MG tablet Take 500 mg by mouth every 6 (six) hours as needed.     albuterol (VENTOLIN HFA)  108 (90 Base) MCG/ACT inhaler Inhale 2 puffs into the lungs every 6 (six) hours as needed for wheezing or shortness of breath. 8 g 5   Ascorbic Acid (VITAMIN C) 250 MG CHEW Chew 250 mg by mouth daily.     calcium carbonate (OS-CAL) 600 MG TABS tablet Take 600 mg by mouth daily.     cetirizine-pseudoephedrine (ZYRTEC-D) 5-120 MG per tablet Take 1 tablet by mouth once as needed for allergies.     Cholecalciferol (VITAMIN D-3 PO) Take 4,000 Units by mouth daily.     CRANBERRY PO Take by  mouth.     estradiol (ESTRACE) 0.1 MG/GM vaginal cream Place 1 Applicatorful vaginally 2 (two) times a week.     Estradiol-Estriol-Progesterone (BIEST/PROGESTERONE) CREA Apply 2 clicks daily, except on Sunday. 40 g PRN   fluticasone furoate-vilanterol (BREO ELLIPTA) 100-25 MCG/ACT AEPB Take 1 puff by mouth once daily  1 each 3   Glucosamine-Chondroitin (GLUCOSAMINE CHONDR COMPLEX PO) Take 1 tablet by mouth daily.     Omega-3 Fatty Acids (FISH OIL) 1000 MG CAPS Take 3 capsules by mouth daily.      pantoprazole (PROTONIX) 40 MG tablet TAKE 1 TABLET BY MOUTH EVERY DAY 90 tablet 1   Pirfenidone (ESBRIET) 801 MG TABS Take 1 tablet (801 mg) by mouth with breakfast, with lunch, and with evening meal. 270 tablet 1   RESTASIS 0.05 % ophthalmic emulsion 1 drop 2 (two) times daily.     trimethoprim (TRIMPEX) 100 MG tablet 1 tab by mouth on Mon - Wed - Fri 36 tablet 3   Vitamin A 2400 MCG (8000 UT) CAPS Take 2,400 mcg by mouth daily.     Vitamin E 180 MG (400 UNIT) CAPS Take 180 mg by mouth daily.     No current facility-administered medications on file prior to visit.        ROS:  All others reviewed and negative.  Objective        PE:  BP 110/66 (BP Location: Left Arm, Patient Position: Sitting, Cuff Size: Normal)    Pulse (!) 101    Temp 99 F (37.2 C) (Oral)    Ht 5' 2.5" (1.588 m)    Wt 135 lb (61.2 kg)    SpO2 100%    BMI 24.30 kg/m                 Constitutional: Pt appears in NAD               HENT: Head: NCAT.                Right Ear: External ear normal.                 Left Ear: External ear normal.                Eyes: . Pupils are equal, round, and reactive to light. Conjunctivae and EOM are normal               Nose: without d/c or deformity               Neck: Neck supple. Gross normal ROM               Cardiovascular: Normal rate and regular rhythm.                 Pulmonary/Chest: Effort normal and breath sounds without rales or wheezing.  Abd:  Soft, NT, ND, + BS,  no organomegaly               Neurological: Pt is alert. At baseline orientation, motor grossly intact               Skin: Skin is warm. No rashes, no other new lesions, LE edema - none               Psychiatric: Pt behavior is normal without agitation   Micro: none  Cardiac tracings I have personally interpreted today:  none  Pertinent Radiological findings (summarize): none   Lab Results  Component Value Date   WBC 7.7 04/22/2021   HGB 12.0 04/22/2021   HCT 36.9 04/22/2021   PLT 328.0 04/22/2021   GLUCOSE 90 04/22/2021   CHOL 274 (H) 04/22/2021   TRIG 288.0 (H) 04/22/2021   HDL 45.40 04/22/2021   LDLDIRECT 194.0 04/22/2021   LDLCALC 124 (H) 08/01/2018   ALT 29 04/20/2021   AST 27 04/20/2021   NA 140 04/22/2021   K 4.4 04/22/2021   CL 102 04/22/2021   CREATININE 0.85 04/22/2021   BUN 25 (H) 04/22/2021   CO2 27 04/22/2021   TSH 1.42 04/22/2021   HGBA1C 6.0 04/22/2021   Assessment/Plan:  Donna Holmes is a 71 y.o. White or Caucasian [1] female with  has a past medical history of ALLERGIC RHINITIS (05/19/2009), Anemia, iron deficiency (06/09/2015), B12 deficiency (08/02/2018), Chagas' disease (80/20/8910), Complication of anesthesia, GERD (gastroesophageal reflux disease), and Headache(784.0) (05/19/2009).  Encounter for well adult exam with abnormal findings Age and sex appropriate education and counseling updated with regular exercise and diet Referrals for preventative services - none needed Immunizations addressed - declines shingrix and tdap Smoking counseling  - none needed Evidence for depression or other mood disorder - none significant Most recent labs reviewed. I have personally reviewed and have noted: 1) the patient's medical and social history 2) The patient's current medications and supplements 3) The patient's height, weight, and BMI have been recorded in the chart   Anemia, iron deficiency Lab Results  Component Value Date   WBC 7.7 04/22/2021   HGB  12.0 04/22/2021   HCT 36.9 04/22/2021   MCV 86.8 04/22/2021   PLT 328.0 04/22/2021   Stable, cont to monitor  B12 deficiency Lab Results  Component Value Date   VITAMINB12 267 04/22/2021   Stable, cont oral replacement - b12 1000 mcg qd   Bad odor of urine ? UTI - for urine studies,  to f/u any worsening symptoms or concerns  Hyperglycemia Lab Results  Component Value Date   HGBA1C 6.0 04/22/2021   Stable, pt to continue current medical treatment  - diet   Hyperlipidemia Lab Results  Component Value Date   LDLCALC 124 (H) 08/01/2018   uncontrolled, pt to continue current low chol diet, has been statin intolerant, for f/u lab today  Followup: Return in about 6 months (around 10/20/2021).  Cathlean Cower, MD 04/22/2021 7:04 PM [facility]

## 2021-04-22 NOTE — Assessment & Plan Note (Signed)
Lab Results  Component Value Date   HGBA1C 6.0 04/22/2021   Stable, pt to continue current medical treatment  - diet

## 2021-04-22 NOTE — Assessment & Plan Note (Signed)
Lab Results  Component Value Date   LDLCALC 124 (H) 08/01/2018   uncontrolled, pt to continue current low chol diet, has been statin intolerant, for f/u lab today

## 2021-04-22 NOTE — Assessment & Plan Note (Signed)
Age and sex appropriate education and counseling updated with regular exercise and diet Referrals for preventative services - none needed Immunizations addressed - declines shingrix and tdap Smoking counseling  - none needed Evidence for depression or other mood disorder - none significant Most recent labs reviewed. I have personally reviewed and have noted: 1) the patient's medical and social history 2) The patient's current medications and supplements 3) The patient's height, weight, and BMI have been recorded in the chart

## 2021-04-24 ENCOUNTER — Encounter: Payer: Self-pay | Admitting: Internal Medicine

## 2021-04-24 LAB — URINE CULTURE

## 2021-04-24 MED ORDER — CEFDINIR 300 MG PO CAPS: 300.0000 mg | ORAL_CAPSULE | Freq: Two times a day (BID) | ORAL | 0 refills | Status: DC

## 2021-04-24 MED ORDER — CIPROFLOXACIN HCL 500 MG PO TABS: 500.0000 mg | ORAL_TABLET | Freq: Two times a day (BID) | ORAL | 0 refills | Status: DC

## 2021-04-24 NOTE — Addendum Note (Signed)
Addended by: Biagio Borg on: 04/24/2021 01:05 PM   Modules accepted: Orders

## 2021-05-05 ENCOUNTER — Other Ambulatory Visit (HOSPITAL_COMMUNITY): Payer: Self-pay

## 2021-05-06 ENCOUNTER — Other Ambulatory Visit: Payer: Self-pay | Admitting: Pulmonary Disease

## 2021-05-06 DIAGNOSIS — R0602 Shortness of breath: Secondary | ICD-10-CM

## 2021-05-11 ENCOUNTER — Ambulatory Visit: Payer: Medicare Other | Admitting: Internal Medicine

## 2021-05-11 ENCOUNTER — Other Ambulatory Visit: Payer: Self-pay

## 2021-05-11 ENCOUNTER — Encounter: Payer: Self-pay | Admitting: Internal Medicine

## 2021-05-11 VITALS — BP 108/66 | HR 70 | Temp 98.1°F | Ht 62.8 in | Wt 136.0 lb

## 2021-05-11 DIAGNOSIS — E7800 Pure hypercholesterolemia, unspecified: Secondary | ICD-10-CM | POA: Diagnosis not present

## 2021-05-11 DIAGNOSIS — N951 Menopausal and female climacteric states: Secondary | ICD-10-CM

## 2021-05-11 DIAGNOSIS — I700 Atherosclerosis of aorta: Secondary | ICD-10-CM

## 2021-05-11 NOTE — Progress Notes (Signed)
Rich Brave [place],acting as a Education administrator for Maximino Greenland, MD.,have documented all relevant documentation on the behalf of Maximino Greenland, MD,as directed by  Maximino Greenland, MD while in the presence of Maximino Greenland, MD.  This visit occurred during the SARS-CoV-2 public health emergency.  Safety protocols were in place, including screening questions prior to the visit, additional usage of staff PPE, and extensive cleaning of exam room while observing appropriate contact time as indicated for disinfecting solutions.  Subjective:     Patient ID: Donna Holmes , female    DOB: 07-Nov-1950 , 71 y.o.   MRN: [id number]   Chief Complaint  Patient presents with   BHRT f/u    HPI  The patient is here today for a follow-up on her hormones. She feels well on her current regimen. She has no specific concerns or complaints at this time. She was recently seen by her PCP and had labs performed.     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   Complication of anesthesia    GERD (gastroesophageal reflux disease)    Headache(784.0) 05/19/2009     Family History  Problem Relation Age of Onset   Alcohol abuse Father    Hypertension Mother    Prostate cancer Maternal Uncle      Current Outpatient Medications:    acetaminophen (TYLENOL) 500 MG tablet, Take 500 mg by mouth every 6 (six) hours as needed., Disp: , Rfl:    albuterol (VENTOLIN HFA) 108 (90 Base) MCG/ACT inhaler, Inhale 2 puffs into the lungs every 6 (six) hours as needed for wheezing or shortness of breath., Disp: 8 g, Rfl: 5   Ascorbic Acid (VITAMIN C) 250 MG CHEW, Chew 250 mg by mouth daily., Disp: , Rfl:    BREO ELLIPTA 100-25 MCG/ACT AEPB, TAKE 1 PUFF BY MOUTH ONCE DAILY, Disp: 60 each, Rfl: 3   calcium carbonate (OS-CAL) 600 MG TABS tablet, Take 600 mg by mouth daily., Disp: , Rfl:     cetirizine-pseudoephedrine (ZYRTEC-D) 5-120 MG per tablet, Take 1 tablet by mouth once as needed for allergies., Disp: , Rfl:    Cholecalciferol (VITAMIN D-3 PO), Take 4,000 Units by mouth daily., Disp: , Rfl:    CRANBERRY PO, Take by mouth., Disp: , Rfl:    estradiol (ESTRACE) 0.1 MG/GM vaginal cream, Place 1 Applicatorful vaginally 2 (two) times a week., Disp: , Rfl:    Estradiol-Estriol-Progesterone (BIEST/PROGESTERONE) CREA, Apply 2 clicks daily, except on Sunday., Disp: 40 g, Rfl: PRN   Glucosamine-Chondroitin (GLUCOSAMINE CHONDR COMPLEX PO), Take 1 tablet by mouth daily., Disp: , Rfl:    Omega-3 Fatty Acids (FISH OIL) 1000 MG CAPS, Take 3 capsules by mouth daily. , Disp: , Rfl:    pantoprazole (PROTONIX) 40 MG tablet, TAKE 1 TABLET BY MOUTH EVERY DAY, Disp: 90 tablet, Rfl: 1   Pirfenidone (ESBRIET) 801 MG TABS, Take 1 tablet (801 mg) by mouth with breakfast, with lunch, and with evening meal., Disp: 270 tablet, Rfl: 1   RESTASIS 0.05 % ophthalmic emulsion, 1 drop 2 (two) times daily., Disp: , Rfl:    Vitamin A 2400 MCG (8000 UT) CAPS, Take 2,400 mcg by mouth daily., Disp: , Rfl:    Vitamin E 180 MG (400 UNIT) CAPS, Take 180 mg by mouth daily., Disp: , Rfl:    trimethoprim (TRIMPEX) 100 MG tablet, 1 tab by mouth  on Mon - Wed - Fri (Patient not taking: Reported on 05/11/2021), Disp: 36 tablet, Rfl: 3   Allergies  Allergen Reactions   Aspirin    Codeine Nausea And Vomiting   Oxycontin [Oxycodone Hcl] Nausea Only and Other (See Comments)    FELT LIKE I WAS GOING TO FAINT   Statins Other (See Comments)    myalgias   Sulfa Antibiotics Nausea And Vomiting   Ibuprofen Other (See Comments)    Other reaction(s): GI Bleed     Review of Systems  Constitutional: Negative.   Respiratory: Negative.    Cardiovascular: Negative.   Gastrointestinal: Negative.   Neurological: Negative.   Psychiatric/Behavioral: Negative.      Today's Vitals   05/11/21 1107  BP: 108/66  Pulse: 70  Temp: 98.1  F (36.7 C)  Weight: 136 lb (61.7 kg)  Height: 5' 2.8" (1.595 m)   Body mass index is 24.25 kg/m.  Wt Readings from Last 3 Encounters:  05/11/21 136 lb (61.7 kg)  04/22/21 135 lb (61.2 kg)  04/08/21 138 lb (62.6 kg)    BP Readings from Last 3 Encounters:  05/11/21 108/66  04/22/21 110/66  04/08/21 112/70    Objective:  Physical Exam Vitals and nursing note reviewed.  Constitutional:      Appearance: Normal appearance.  HENT:     Head: Normocephalic and atraumatic.     Nose:     Comments: Masked     Mouth/Throat:     Comments: Masked  Eyes:     Extraocular Movements: Extraocular movements intact.  Cardiovascular:     Rate and Rhythm: Normal rate and regular rhythm.     Heart sounds: Normal heart sounds.  Pulmonary:     Effort: Pulmonary effort is normal.     Breath sounds: Normal breath sounds.  Musculoskeletal:     Cervical back: Normal range of motion.  Skin:    General: Skin is warm.  Neurological:     General: No focal deficit present.     Mental Status: She is alert.  Psychiatric:        Mood and Affect: Mood normal.        Behavior: Behavior normal.     Assessment And Plan:     1. Symptomatic menopausal or female climacteric states Comments: I will change Biest to 4 days weekly since she is using Estrace vaginally, I plan to wean her off of Biest after this supply runs out. She verbalizes understanding of her treatment plan. She will f/u in 4 months for re-evaluation.   2. Aortic atherosclerosis ([place]) Comments: She declines statin therapy due to h/o myalgias. She is encouraged to practice heart healthy habits. Should discuss ASA use with PCP.   3. Pure hypercholesterolemia Comments: Chronic, not on statin therapy due to h/o myalgias. She is encouraged to avoid fried foods, increase fish intake (if tolerated) and to aim for at least 150 minutes of exercise per week.   Patient was given opportunity to ask questions. Patient verbalized understanding of the  plan and was able to repeat key elements of the plan. All questions were answered to their satisfaction.   I, Maximino Greenland, MD, have reviewed all documentation for this visit. The documentation on 05/11/21 for the exam, diagnosis, procedures, and orders are all accurate and complete.   IF YOU HAVE BEEN REFERRED TO A SPECIALIST, IT MAY TAKE 1-2 WEEKS TO SCHEDULE/PROCESS THE REFERRAL. IF YOU HAVE NOT HEARD FROM US/SPECIALIST IN TWO WEEKS, PLEASE GIVE [place] A  CALL AT [phone] X [phone].   THE PATIENT IS ENCOURAGED TO PRACTICE SOCIAL DISTANCING DUE TO THE COVID-19 PANDEMIC.

## 2021-05-11 NOTE — Patient Instructions (Signed)
Mediterranean Diet A Mediterranean diet refers to food and lifestyle choices that are based on the traditions of countries located on the [employer]. It focuses on eating more fruits, vegetables, whole grains, beans, nuts, seeds, and heart-healthy fats, and eating less dairy, meat, eggs, and processed foods with added sugar, salt, and fat. This way of eating has been shown to help prevent certain conditions and improve outcomes for people who have chronic diseases, like kidney disease and heart disease. What are tips for following this plan? Reading food labels Check the serving size of packaged foods. For foods such as rice and pasta, the serving size refers to the amount of cooked product, not dry. Check the total fat in packaged foods. Avoid foods that have saturated fat or trans fats. Check the ingredient list for added sugars, such as corn syrup. Shopping  Buy a variety of foods that offer a balanced diet, including: Fresh fruits and vegetables (produce). Grains, beans, nuts, and seeds. Some of these may be available in unpackaged forms or large amounts (in bulk). Fresh seafood. Poultry and eggs. Low-fat dairy products. Buy whole ingredients instead of prepackaged foods. Buy fresh fruits and vegetables in-season from local farmers markets. Buy plain frozen fruits and vegetables. If you do not have access to quality fresh seafood, buy precooked frozen shrimp or canned fish, such as tuna, salmon, or sardines. Stock your pantry so you always have certain foods on hand, such as olive oil, canned tuna, canned tomatoes, rice, pasta, and beans. Cooking Cook foods with extra-virgin olive oil instead of using butter or other vegetable oils. Have meat as a side dish, and have vegetables or grains as your main dish. This means having meat in small portions or adding small amounts of meat to foods like pasta or stew. Use beans or vegetables instead of meat in common dishes like chili or  lasagna. Experiment with different cooking methods. Try roasting, broiling, steaming, and sauting vegetables. Add frozen vegetables to soups, stews, pasta, or rice. Add nuts or seeds for added healthy fats and plant protein at each meal. You can add these to yogurt, salads, or vegetable dishes. Marinate fish or vegetables using olive oil, lemon juice, garlic, and fresh herbs. Meal planning Plan to eat one vegetarian meal one day each week. Try to work up to two vegetarian meals, if possible. Eat seafood two or more times a week. Have healthy snacks readily available, such as: Vegetable sticks with hummus. Greek yogurt. Fruit and nut trail mix. Eat balanced meals throughout the week. This includes: Fruit: 2-3 servings a day. Vegetables: 4-5 servings a day. Low-fat dairy: 2 servings a day. Fish, poultry, or lean meat: 1 serving a day. Beans and legumes: 2 or more servings a week. Nuts and seeds: 1-2 servings a day. Whole grains: 6-8 servings a day. Extra-virgin olive oil: 3-4 servings a day. Limit red meat and sweets to only a few servings a month. Lifestyle  Cook and eat meals together with your family, when possible. Drink enough fluid to keep your urine pale yellow. Be physically active every day. This includes: Aerobic exercise like running or swimming. Leisure activities like gardening, walking, or housework. Get 7-8 hours of sleep each night. If recommended by your health care provider, drink red wine in moderation. This means 1 glass a day for nonpregnant women and 2 glasses a day for men. A glass of wine equals 5 oz (150 mL). What foods should I eat? Fruits Apples. Apricots. Avocado. Berries. Bananas. Cherries. Dates.  Figs. Grapes. Lemons. Melon. Oranges. Peaches. Plums. Pomegranate. Vegetables Artichokes. Beets. Broccoli. Cabbage. Carrots. Eggplant. Green beans. Chard. Kale. Spinach. Onions. Leeks. Peas. Squash. Tomatoes. Peppers. Radishes. Grains Whole-grain pasta. Brown  rice. Bulgur wheat. Polenta. Couscous. Whole-wheat bread. [place]. Meats and other proteins Beans. Almonds. Sunflower seeds. Pine nuts. Peanuts. [place]. Salmon. Scallops. Shrimp. [place]. Tilapia. Clams. Oysters. Eggs. Poultry without skin. Dairy Low-fat milk. Cheese. Greek yogurt. Fats and oils Extra-virgin olive oil. Avocado oil. Grapeseed oil. Beverages Water. Red wine. Herbal tea. Sweets and desserts Greek yogurt with honey. Baked apples. Poached pears. Trail mix. Seasonings and condiments Basil. Cilantro. Coriander. Cumin. Mint. Parsley. Sage. Rosemary. Tarragon. Garlic. Oregano. Thyme. Pepper. Balsamic vinegar. Tahini. Hummus. Tomato sauce. Olives. Mushrooms. The items listed above may not be a complete list of foods and beverages you can eat. Contact a dietitian for more information. What foods should I limit? This is a list of foods that should be eaten rarely or only on special occasions. Fruits Fruit canned in syrup. Vegetables Deep-fried potatoes (french fries). Grains Prepackaged pasta or rice dishes. Prepackaged cereal with added sugar. Prepackaged snacks with added sugar. Meats and other proteins Beef. Pork. Lamb. Poultry with skin. Hot dogs. [place] Salines. Dairy Ice cream. Sour cream. Whole milk. Fats and oils Butter. Canola oil. Vegetable oil. Beef fat (tallow). Lard. Beverages Juice. Sugar-sweetened soft drinks. Beer. Liquor and spirits. Sweets and desserts Cookies. Cakes. Pies. Candy. Seasonings and condiments Mayonnaise. Pre-made sauces and marinades. The items listed above may not be a complete list of foods and beverages you should limit. Contact a dietitian for more information. Summary The Mediterranean diet includes both food and lifestyle choices. Eat a variety of fresh fruits and vegetables, beans, nuts, seeds, and whole grains. Limit the amount of red meat and sweets that you eat. If recommended by your health care provider, drink red wine in moderation.  This means 1 glass a day for nonpregnant women and 2 glasses a day for men. A glass of wine equals 5 oz (150 mL). This information is not intended to replace advice given to you by your health care provider. Make sure you discuss any questions you have with your health care provider. Document Revised: 04/06/2019 Document Reviewed: 02/01/2019 Elsevier Patient Education  2022 [employer].

## 2021-05-12 ENCOUNTER — Other Ambulatory Visit (HOSPITAL_COMMUNITY): Payer: Self-pay

## 2021-05-22 ENCOUNTER — Other Ambulatory Visit: Payer: Self-pay

## 2021-05-22 ENCOUNTER — Encounter: Payer: Self-pay | Admitting: Pulmonary Disease

## 2021-05-22 ENCOUNTER — Other Ambulatory Visit (INDEPENDENT_AMBULATORY_CARE_PROVIDER_SITE_OTHER): Payer: Medicare Other

## 2021-05-22 DIAGNOSIS — Z5181 Encounter for therapeutic drug level monitoring: Secondary | ICD-10-CM

## 2021-05-22 LAB — HEPATIC FUNCTION PANEL
ALT: 26 U/L (ref 0–35)
AST: 26 U/L (ref 0–37)
Albumin: 4.7 g/dL (ref 3.5–5.2)
Alkaline Phosphatase: 60 U/L (ref 39–117)
Bilirubin, Direct: 0.1 mg/dL (ref 0.0–0.3)
Total Bilirubin: 0.3 mg/dL (ref 0.2–1.2)
Total Protein: 7.6 g/dL (ref 6.0–8.3)

## 2021-05-22 NOTE — Telephone Encounter (Signed)
Received the following message from patient:  ? ?"Dr Hetty Ely I was wonder if I was enrolled on a research study you mentioned  on my last visit with you. As of now ,no one has contacted me from that department. I would like to know what is the status . ?Thanks [employer]." ? ?Dr. Erin Fulling, can you please advise? Thanks!  ?

## 2021-05-25 ENCOUNTER — Telehealth: Payer: Self-pay | Admitting: *Deleted

## 2021-05-25 ENCOUNTER — Other Ambulatory Visit: Payer: Self-pay | Admitting: Pulmonary Disease

## 2021-05-25 NOTE — Telephone Encounter (Signed)
Freddi Starr, MD ?to Valerie Salts, [place]   ?   3:40 PM ?I let the director of the research center know of your interest and they said they would be getting in contact with you.  ? ?Cherina, can you place formal referral to the research team.  ? ?Thanks,  ?JD  ? ? ?Will forward to Research as I am not familiar with referral process for them. Thanks.  ?

## 2021-05-25 NOTE — Telephone Encounter (Signed)
Patient interested in research. Referred to [facility] by Dr. Erin Fulling. ?

## 2021-05-26 NOTE — Telephone Encounter (Signed)
Donna Holmes has already contacted her. We are going to be emailing her a "research consent and prescreen her" ? ?

## 2021-06-01 ENCOUNTER — Other Ambulatory Visit (HOSPITAL_COMMUNITY): Payer: Self-pay

## 2021-06-09 ENCOUNTER — Other Ambulatory Visit (HOSPITAL_COMMUNITY): Payer: Self-pay

## 2021-06-10 ENCOUNTER — Telehealth: Payer: Self-pay | Admitting: Internal Medicine

## 2021-06-10 NOTE — Telephone Encounter (Signed)
Left message for patient to call back to schedule Medicare Annual Wellness Visit  ? ?Last AWV  05/08/18 ? ?Please schedule at anytime with [facility] if patient calls the office back.   ? ? ? ?Any questions, please call me at [phone]  ?

## 2021-06-11 ENCOUNTER — Other Ambulatory Visit (HOSPITAL_COMMUNITY): Payer: Self-pay

## 2021-06-16 ENCOUNTER — Encounter: Payer: Self-pay | Admitting: Pulmonary Disease

## 2021-06-16 DIAGNOSIS — J84112 Idiopathic pulmonary fibrosis: Secondary | ICD-10-CM

## 2021-06-16 DIAGNOSIS — Z5181 Encounter for therapeutic drug level monitoring: Secondary | ICD-10-CM

## 2021-06-16 NOTE — Telephone Encounter (Signed)
Dr. Erin Fulling, please see pt's email. She is asking if she still needs to be doing monthly LFTs. This will be her third month - pt states she has an appt tomorrow for her monthly draw. Pt has been on Esbriet since 10/2020. Thanks.  ?

## 2021-06-24 LAB — HM DEXA SCAN

## 2021-07-02 ENCOUNTER — Other Ambulatory Visit (HOSPITAL_COMMUNITY): Payer: Self-pay

## 2021-07-03 ENCOUNTER — Other Ambulatory Visit (HOSPITAL_COMMUNITY): Payer: Self-pay

## 2021-07-07 ENCOUNTER — Encounter: Payer: Medicare Other | Admitting: *Deleted

## 2021-07-07 DIAGNOSIS — Z006 Encounter for examination for normal comparison and control in clinical research program: Secondary | ICD-10-CM

## 2021-07-07 DIAGNOSIS — J84112 Idiopathic pulmonary fibrosis: Secondary | ICD-10-CM

## 2021-07-07 NOTE — Research (Signed)
Title:A Randomized, Double-blind, Placebo-controlled, Phase 3 Study of the Efficacy and Safety of Inhaled Treprostinil in Subjects with Idiopathic Pulmonary Fibrosis  ? ?Dose and Duration of Treatment:Treprostinil for Inhalation 0.6 mg/mL, or placebo (randomly assigned 1:1) over a 52 week period. ? ?Protocol # RIN-PF-301; IND # [id number]; Clinical Trials.gov Identifier: [id number] ? ?Sponsor: [employer], [place] [address] ? ?Protocol Version/Amendment: Amendment 3, date 25Apr2022 for 07/07/2021 date of double checked  yes ?Below information reflects this PA -YES ? ?Consent Version: ver.2.0  for 07/07/2021 date of double checked  yes ? ?Psychologist, forensic Sponsor:  [employer] ?Investigator Brochure Version: v.17 for _0  date_1  of 07/07/2021 below information reflects this IB - YES ?Psychologist, forensic Product:  Treprostinil for Inhalation, 0.6 mg/mL ? ?Key Features of the Drug: ?Treprostinil (LRX-15, 15AU81, UT-15) is a stable tricyclic benzindene analogue of the naturally occurring prostacyclin (PGI2; epoprostenol), a member of the eicosanoid family of autocoids; these compounds occur widely in tissues and have important pharmacological properties with potent activity, especially on the cardiovascular system and smooth muscle ([place]). ?An inhalation solution of treprostinil, 0.6 mg/mL, delivered using an ultrasonic nebulizer, is currently FDA approved to treat pulmonary arterial hypertension (PAH); [WHO Group 1, PH-ILD; WHO Group 3. ?Inhaled treprostinil is NOW being evaluated as primary treatment of IPF, idiopathic pulmonary fibrosis.  ? ?Key Inclusion Criteria: ??71 years of age ?Diagnosis of IPF based on the 2018 ATS/ERS/JRS/ALAT Clinical Practice Guideline (FVC ?45% predicted at Screening).  ?Subjects on pirfenidone or nintedanib must be on a stable and optimized dose for ?30 days prior to Baseline. Concomitant use of both pirfenidone and nintedanib  is not permitted. ?Males with a partner of childbearing potential must use a condom for the duration of treatment and for at least 48 hours after discontinuing study drug.  ? ?Key Exclusion Criteria: ?Subject is pregnant or lactating.  ?FEV1/FVC <0.70 at Screening.  ?Prior intolerance or significant lack of efficacy to a prostacyclin or prostacyclin analogue that resulted in discontinuation or inability to effectively titrate that therapy.  ?The subject has received any PAH-approved therapy, including prostacyclin therapy (epoprostenol, treprostinil, iloprost, or beraprost; except for acute vasoreactivity testing), IP receptor agonists (selexipag), endothelin receptor antagonists, phosphodiesterase type 5 inhibitors (PDE5-Is), or soluble guanylate cyclase stimulators within 60 days prior to Baseline. As needed use of a PDE5-I for erectile dysfunction is permitted, provided no doses are taken within 48 hours of any study-related efficacy assessments.  ?Use of any of the following medications:  ?`     a. Azathioprine (AZA), cyclosporine, mycophenolate mofetil, tacroliumus, oral ?          corticosteroids (OCS) >20 mg/day or the combination of  ?          OCS+AZA+N-acetylcysteine within 30 days prior to Baseline.  ?      b. Cyclophosphamide within 60 days prior to Baseline  ?      c. Rituximab within 6 months prior to Baseline  ? Receiving >10 L/min of oxygen at rest at Baseline.  ? Exacerbation of IPF or active pulmonary or upper respiratory infection within 30 days prior to Baseline. Subjects must have completed any antibiotic or steroid regimens for treatment of the infection or acute exacerbation more than 30 days prior to Baseline to be eligible. If hospitalized for an acute exacerbation of IPF or a pulmonary or upper respiratory infection, subjects must have been discharged more than 90 days prior to Baseline to be eligible.  ?Uncontrolled cardiac disease, defined as myocardial infarction within  6 months prior to  Baseline or unstable angina within 30 days prior to Baseline. ?Use of any investigational drug/device or participation in any investigational study in which the subject received a medical intervention (ie, procedure, device, medication/supplement) within 30 days prior to Screening. Subjects participating in non-interventional, observational, or registry studies are eligible.  ?Life expectancy <6 months due to IPF or a concomitant illness.  ?Acute pulmonary embolism within 90 days prior to Baseline.  ? ?Pharmacodynamics:  (Dose-Response Relationships) - In a clinical study of 241 healthy volunteers, single doses of Tyvaso 54 ?g (the target maintenance dose per session) and 84 ?g (supratherapeutic inhalation dose) prolonged the QTc interval by approximately 10 ms. The QTc effect dissipated rapidly as the concentration of treprostinil decreased. ? ?Pharmacokinetics: (ADME) - Treprostinil is substantially metabolized by the liver, primarily by CYP2C8.  The elimination of treprostinil (following [place] administration of treprostinil) is biphasic, with a terminal elimination t1?2 of approximately 4 hours.   ? ?Contraindications:   ?None ? ?Special Warnings/Considerations: ?Treprostinil undergoes substantial hepatic metabolism; thus, liver impairment could result in decreased metabolism and increased systemic exposure to treprostinil potentiating the occurrence and/or severity of AEs and/or overdose. ?There are no adequate and well-controlled studies with Tyvaso in pregnant women. There are risks to the mother and the fetus associated with PAH. ?Patients who discontinue the use of any effective therapy may be expected to notice a decline in their clinical status. Patients who abruptly discontinue therapy (ie, miss 2 or more doses), or markedly reduce the dose, are at risk for worsening of PAH symptoms or a rebound in [facility]. ?There is no evidence to suggest or suspect that patients taking inhaled treprostinil would be at risk for  withdrawal symptoms or rebound effects.  ?Treprostinil inhibits platelet aggregation and increases the risk of bleeding, particularly with concomitant anticoagulants and antiplatelet therapies. ?Treprostinil is a pulmonary and systemic vasodilator. In patients with low systemic arterial pressure, inhaled treprostinil solution may cause symptomatic hypotension, which may present as but is not limited to light-headedness, dizziness, blurred or faded vision, and syncope. Concomitant administration of treprostinil with diuretics, antihypertensive agents, or other vasodilators that may lower blood pressure increases the risk of symptomatic hypotension. ?Signs and symptoms of overdose with inhaled treprostinil solution may correspond to dose-limiting pharmacologic effects, including diarrhea, flushing, headache, hypotension, nausea, and vomiting. Most events are self-limiting and resolve with reduction or withholding of inhaled treprostinil solution. ? ?Interactions:   ?           Drug Interaction Studies Conducted with Treprostinil: ?Study # P01:08: Treprostinil formulation [place] Remodulin vs Acetaminophen, to evaluate effect of acetaminophen on Treprostinil pK, showed no effect. ?Study # P01:12: Treprostinil formulation [place] Remodulin vs Warfarin, to evaluate effect of Warfarin on Treprostinil pK/PD, showed no effect. ?Study TDE-PH-105: Treprostinil formulation Orenitram vs Bosentan, to evaluate effect of Bosentan on Treprostinil pK, showed no effect. ?Study TDE-PH-106: Treprostinil formulation Orenitram vs Sildenafil, to evaluate effect of Sildenafil on Treprostinil pK, showed no effect. ?Study TDE-PH-109: Treprostinil formulation Orenitram vs Rifampicin (CYP2C8/9 inducer), to evaluate the effect of inducing the MIW8E3 metabolic pathway on the pK of Treprostinil, confirmed metabolic pathway via OZY2Q8/2. Expected interaction resulting in reduced Cmax and AUC. ?Study TDE-PH-110: Treprostinil formulation Orenitram vs  Gemfibrozil (CYP2C8  inhibitor), to evaluate the effect of inhibiting the NOI3B0 metabolic pathway on the pK of Treprostinil, confirmed metabolic pathway primarily via CYP2C8. Expected interaction resulting

## 2021-07-07 NOTE — Progress Notes (Signed)
Subject Donna Holmes, DOB [birth year] , was seen for documentation of consent for participation in Clinical Trial study/Protocol # # RIN-PF-301 ?She had no questions. ?History & exam findings: ?Cardiopulmonary symptoms are stable ?Other or new symptoms denied. ?Pertinent physical findings include: Slight tachycardia with S4;slightly ?accentuated S2; rales auscultated over the lower two thirds of the posterior chest. ?All physical findings NCS ?                                                                    Hendricks Limes MD,SI ? ?

## 2021-07-10 ENCOUNTER — Other Ambulatory Visit (HOSPITAL_COMMUNITY): Payer: Self-pay

## 2021-07-27 ENCOUNTER — Other Ambulatory Visit: Payer: Self-pay | Admitting: Internal Medicine

## 2021-07-27 NOTE — Telephone Encounter (Signed)
Please refill as per office routine med refill policy (all routine meds to be refilled for 3 mo or monthly (per pt preference) up to one year from last visit, then month to month grace period for 3 mo, then further med refills will have to be denied) ? ?

## 2021-07-28 ENCOUNTER — Other Ambulatory Visit (HOSPITAL_COMMUNITY): Payer: Self-pay

## 2021-07-31 ENCOUNTER — Other Ambulatory Visit (HOSPITAL_COMMUNITY): Payer: Self-pay

## 2021-08-05 ENCOUNTER — Other Ambulatory Visit (HOSPITAL_COMMUNITY): Payer: Self-pay

## 2021-08-13 ENCOUNTER — Other Ambulatory Visit: Payer: Self-pay | Admitting: Internal Medicine

## 2021-08-13 ENCOUNTER — Other Ambulatory Visit (HOSPITAL_COMMUNITY): Payer: Self-pay

## 2021-08-13 NOTE — Telephone Encounter (Signed)
Please refill as per office routine med refill policy (all routine meds to be refilled for 3 mo or monthly (per pt preference) up to one year from last visit, then month to month grace period for 3 mo, then further med refills will have to be denied)

## 2021-08-25 ENCOUNTER — Encounter: Payer: Medicare Other | Admitting: *Deleted

## 2021-08-25 DIAGNOSIS — J84112 Idiopathic pulmonary fibrosis: Secondary | ICD-10-CM

## 2021-08-25 DIAGNOSIS — Z006 Encounter for examination for normal comparison and control in clinical research program: Secondary | ICD-10-CM

## 2021-08-25 NOTE — Research (Signed)
Title:A Randomized, Double-blind, Placebo-controlled, Phase 3 Study of the Efficacy and Safety of Inhaled Treprostinil in Subjects with Idiopathic Pulmonary Fibrosis   Dose and Duration of Treatment:Treprostinil for Inhalation 0.6 mg/mL, or placebo (randomly assigned 1:1) over a 52 week period.  Protocol # RIN-PF-301; IND # [id number]; Clinical Trials.gov Identifier: [id number]  Sponsor: [employer] [place], [place] [id number]  Investigator Brochure Product:  Treprostinil for Inhalation, 0.6 mg/mL  Key Features of the Drug: Treprostinil (LRX-15, 15AU81, UT-15) is a stable tricyclic benzindene analogue of the naturally occurring prostacyclin (PGI2; epoprostenol), a member of the eicosanoid family of autocoids; these compounds occur widely in tissues and have important pharmacological properties with potent activity, especially on the cardiovascular system and smooth muscle ([place]). An inhalation solution of treprostinil, 0.6 mg/mL, delivered using an ultrasonic nebulizer, is currently FDA approved to treat pulmonary arterial hypertension (PAH); [WHO Group 1, PH-ILD; WHO Group 3. Inhaled treprostinil is NOW being evaluated as primary treatment of IPF, idiopathic pulmonary fibrosis.   Key Inclusion Criteria: ?73 years of age Diagnosis of IPF based on the 2018 ATS/ERS/JRS/ALAT Clinical Practice Guideline (FVC ?45% predicted at Screening).  Subjects on pirfenidone or nintedanib must be on a stable and optimized dose for ?30 days prior to Baseline. Concomitant use of both pirfenidone and nintedanib is not permitted. Males with a partner of childbearing potential must use a condom for the duration of treatment and for at least 48 hours after discontinuing study drug.   Key Exclusion Criteria: Subject is pregnant or lactating.  FEV1/FVC <0.70 at Screening.  Prior intolerance or significant lack of efficacy to a prostacyclin or prostacyclin analogue that resulted in  discontinuation or inability to effectively titrate that therapy.  The subject has received any PAH-approved therapy, including prostacyclin therapy (epoprostenol, treprostinil, iloprost, or beraprost; except for acute vasoreactivity testing), IP receptor agonists (selexipag), endothelin receptor antagonists, phosphodiesterase type 5 inhibitors (PDE5-Is), or soluble guanylate cyclase stimulators within 60 days prior to Baseline. As needed use of a PDE5-I for erectile dysfunction is permitted, provided no doses are taken within 48 hours of any study-related efficacy assessments.  Use of any of the following medications:  `     a. Azathioprine (AZA), cyclosporine, mycophenolate mofetil, tacroliumus, oral           corticosteroids (OCS) >20 mg/day or the combination of            OCS+AZA+N-acetylcysteine within 30 days prior to Baseline.        b. Cyclophosphamide within 60 days prior to Baseline        c. Rituximab within 6 months prior to Baseline   Receiving >10 L/min of oxygen at rest at Baseline.   Exacerbation of IPF or active pulmonary or upper respiratory infection within 30 days prior to Baseline. Subjects must have completed any antibiotic or steroid regimens for treatment of the infection or acute exacerbation more than 30 days prior to Baseline to be eligible. If hospitalized for an acute exacerbation of IPF or a pulmonary or upper respiratory infection, subjects must have been discharged more than 90 days prior to Baseline to be eligible.  Uncontrolled cardiac disease, defined as myocardial infarction within 6 months prior to Baseline or unstable angina within 30 days prior to Baseline. Use of any investigational drug/device or participation in any investigational study in which the subject received a medical intervention (ie, procedure, device, medication/supplement) within 30 days prior to Screening. Subjects participating in non-interventional, observational, or registry studies are eligible.   Life expectancy <  6 months due to IPF or a concomitant illness.  Acute pulmonary embolism within 90 days prior to Baseline.   Pharmacodynamics:  (Dose-Response Relationships) - In a clinical study of 241 healthy volunteers, single doses of Tyvaso 54 ?g (the target maintenance dose per session) and 84 ?g (supratherapeutic inhalation dose) prolonged the QTc interval by approximately 10 ms. The QTc effect dissipated rapidly as the concentration of treprostinil decreased.  Pharmacokinetics: (ADME) - Treprostinil is substantially metabolized by the liver, primarily by CYP2C8.  The elimination of treprostinil (following [place] administration of treprostinil) is biphasic, with a terminal elimination t1?2 of approximately 4 hours.    Contraindications:   None  Special Warnings/Considerations: Treprostinil undergoes substantial hepatic metabolism; thus, liver impairment could result in decreased metabolism and increased systemic exposure to treprostinil potentiating the occurrence and/or severity of AEs and/or overdose. There are no adequate and well-controlled studies with Tyvaso in pregnant women. There are risks to the mother and the fetus associated with PAH. Patients who discontinue the use of any effective therapy may be expected to notice a decline in their clinical status. Patients who abruptly discontinue therapy (ie, miss 2 or more doses), or markedly reduce the dose, are at risk for worsening of PAH symptoms or a rebound in [facility]. There is no evidence to suggest or suspect that patients taking inhaled treprostinil would be at risk for withdrawal symptoms or rebound effects.  Treprostinil inhibits platelet aggregation and increases the risk of bleeding, particularly with concomitant anticoagulants and antiplatelet therapies. Treprostinil is a pulmonary and systemic vasodilator. In patients with low systemic arterial pressure, inhaled treprostinil solution may cause symptomatic hypotension, which may  present as but is not limited to light-headedness, dizziness, blurred or faded vision, and syncope. Concomitant administration of treprostinil with diuretics, antihypertensive agents, or other vasodilators that may lower blood pressure increases the risk of symptomatic hypotension. Signs and symptoms of overdose with inhaled treprostinil solution may correspond to dose-limiting pharmacologic effects, including diarrhea, flushing, headache, hypotension, nausea, and vomiting. Most events are self-limiting and resolve with reduction or withholding of inhaled treprostinil solution.  Interactions:              Drug Interaction Studies Conducted with Treprostinil: Study # P01:08: Treprostinil formulation [place] Remodulin vs Acetaminophen, to evaluate effect of acetaminophen on Treprostinil pK, showed no effect. Study # P01:12: Treprostinil formulation [place] Remodulin vs Warfarin, to evaluate effect of Warfarin on Treprostinil pK/PD, showed no effect. Study TDE-PH-105: Treprostinil formulation Orenitram vs Bosentan, to evaluate effect of Bosentan on Treprostinil pK, showed no effect. Study TDE-PH-106: Treprostinil formulation Orenitram vs Sildenafil, to evaluate effect of Sildenafil on Treprostinil pK, showed no effect. Study TDE-PH-109: Treprostinil formulation Orenitram vs Rifampicin (CYP2C8/9 inducer), to evaluate the effect of inducing the WGN5A2 metabolic pathway on the pK of Treprostinil, confirmed metabolic pathway via ZHY8M5/7. Expected interaction resulting in reduced Cmax and AUC. Study TDE-PH-110: Treprostinil formulation Orenitram vs Gemfibrozil (CYP2C8  inhibitor), to evaluate the effect of inhibiting the QIO9G2 metabolic pathway on the pK of Treprostinil, confirmed metabolic pathway primarily via CYP2C8. Expected interaction resulting in significantly increased Cmax and AUC. Study TDE-PH-110: Treprostinil formulation Orenitram vs Fluconazole (CYP2C9 inhibitor), to evaluate the effect of inhibiting the  XBM8U1 metabolic pathway on on the pK of Treprostinil, had no notable effect. Confirmed CYP2C9 plays a minor role in treprostinil metabolism.  Serious Adverse Reactions Observed in Clinical Studies with Inhaled Treprostinil: Pulmonary Arterial Hypertension: all occurrences from RIN-PH-301 study for Pulmonary Arterial Hypertension. No serious adverse reactions met criteria for inclusion  in inhaled treprostinil solution clinical studies for interstitial lung disease. System/Organ Class: Nervous System disorders - Syncope in 3 of 115 subjects exposed (2.6%)      Serious Adverse Reactions Observed Postmarketing: Gastrointestinal disorders: vomiting, nausea, diarrhea, GI Hemorrhage (reflects an aggregate of the following: GI hemorrhage, rectal hemorrhage, blood in stool [hematochezia or melena], and hematemesis). General disorders and administration site conditions: Pain Infections and infestations: Pneumonia Nervous system disorders: Syncope, dizziness, headache Respiratory, thoracic and mediastinal disorders: Hemoptysis, cough, epistaxis Vascular disorders: Hypotension, hemorrhage (unspecified blood loss ranging from nondescript to heavy or excessive bleeding).  Safety Data:  As of 29 September 2020, approximately 955 subjects have received treprostinil by inhalation across various studies, ranging from acute studies in healthy volunteers to long-term treatment in subjects with PAH and PH-ILD.   The estimate of worldwide exposure to treprostinil in the marketed inhaled formulations is approximately 41,[phone] total patient treatment-days (approximately 34,318 patient treatment-years). The most common adverse effects of inhaled treprostinil ?10% were cough, diarrhea, dizziness, flushing, headache, muscle/jaw/bone pain, nausea, pharyngolaryngeal pain, oropharyngeal pain, syncope, and throat irritation. Other adverse reactions in the observational study comparing subjects taking inhaled treprostinil and a  control group were cough, hemoptysis, nasal discomfort, and throat irritation. Angioedema is a rare but serious reaction that has been seen in the post-marketing setting with treprostinil.  Stability:  Ampoules of drug product are stable until the date indicated when stored in the unopened foil pouch. The [place] commercial labeling states the following regarding storage conditions: Store at [place] to [place] (88F to 84F), with excursions permitted to 20E to 33I (43F to 51F).  Once the foil pack is opened, ampoules should be used within 7 days. Because Tyvaso is light-sensitive, unopened ampoules should be stored in the foil pouch.   [facility] [place] Coordinator note :   This visit for Subject Donna Holmes with DOB: Jun 08, 1950 on 08/25/2021 for the above protocol is Visit/Encounter # Screening  and is for purpose of research.   The consent for this encounter is under Protocol Version: Amendment 3 approved 07 Jul 2020  IB: version17-  18JUL 2021  ICF: Amendment 1 Version 2.0 approved 29Dec2020 is currently IRB approved.   Subject expressed continued interest and consent in continuing as a study subject. Subject confirmed that there was  no change in contact information (e.g. address, telephone, email). Subject thanked for participation in research and contribution to science.  In this visit 08/25/2021 the subject will be evaluated by  Sub-Investigator named Dr. Unice Cobble. This research coordinator has verified that the above investigator is  up to date with his/her training logs.   The Subject was informed that the PI Dr. Brand Males continues to have oversight of the subject's visits and course  through relevant discussions, reviews and also specifically of this visit by routing of this note to the [place].  This visit is a rescreening for this subject. The subject was a screen fail due to spirometry values not meeting criteria. The subject decided and the sponsor  allowed, for the subject to be rescreened.   All procedures were completed per the above mentioned protocol for the screening visit. The subject was consented prior to any study procedures taking place. Please refer to the subjects study binder for further documentation and details of the visit.     Signed by [place], CMA, BS, [place] Coordinator [place], [place] 11:58 AM 08/25/2021

## 2021-08-25 NOTE — Progress Notes (Signed)
Donna Holmes, [name] 07-24-1950,was seen as subject in a clinical trial /Protocol # RIN-PF-301 New symptoms include cough productive of scant yellow sputum , chiefly each morning X 2 weeks without other infectious or URTI symptoms. She describes chronic Raynaud's phenomenon symptoms of bluish color in feet post cold exposure w/o hand involvement. Pertinent physical findings include: Coarse rales @ lower 2/3 of lungs. Slight regular tachycardia. All physical findings NCS                                                                     Hendricks Limes MD,SI

## 2021-09-01 ENCOUNTER — Other Ambulatory Visit (HOSPITAL_COMMUNITY): Payer: Self-pay

## 2021-09-07 ENCOUNTER — Other Ambulatory Visit (INDEPENDENT_AMBULATORY_CARE_PROVIDER_SITE_OTHER): Payer: Medicare Other

## 2021-09-07 DIAGNOSIS — Z5181 Encounter for therapeutic drug level monitoring: Secondary | ICD-10-CM

## 2021-09-07 DIAGNOSIS — J84112 Idiopathic pulmonary fibrosis: Secondary | ICD-10-CM

## 2021-09-07 LAB — HEPATIC FUNCTION PANEL
ALT: 25 U/L (ref 0–35)
AST: 27 U/L (ref 0–37)
Albumin: 4.4 g/dL (ref 3.5–5.2)
Alkaline Phosphatase: 59 U/L (ref 39–117)
Bilirubin, Direct: 0.1 mg/dL (ref 0.0–0.3)
Total Bilirubin: 0.3 mg/dL (ref 0.2–1.2)
Total Protein: 7.4 g/dL (ref 6.0–8.3)

## 2021-09-09 ENCOUNTER — Other Ambulatory Visit (HOSPITAL_COMMUNITY): Payer: Self-pay

## 2021-09-20 ENCOUNTER — Other Ambulatory Visit: Payer: Self-pay | Admitting: Pulmonary Disease

## 2021-09-20 DIAGNOSIS — R0602 Shortness of breath: Secondary | ICD-10-CM

## 2021-09-21 ENCOUNTER — Ambulatory Visit (HOSPITAL_COMMUNITY)
Admission: RE | Admit: 2021-09-21 | Discharge: 2021-09-21 | Disposition: A | Discharge status: Home / Self Care | Payer: Medicare Other | Source: Ambulatory Visit | Attending: Internal Medicine | Admitting: Internal Medicine

## 2021-09-21 DIAGNOSIS — J84112 Idiopathic pulmonary fibrosis: Secondary | ICD-10-CM | POA: Diagnosis not present

## 2021-09-23 ENCOUNTER — Telehealth: Payer: Self-pay | Admitting: Internal Medicine

## 2021-09-23 ENCOUNTER — Encounter: Payer: Medicare Other | Admitting: *Deleted

## 2021-09-23 DIAGNOSIS — Z006 Encounter for examination for normal comparison and control in clinical research program: Secondary | ICD-10-CM

## 2021-09-23 DIAGNOSIS — J84112 Idiopathic pulmonary fibrosis: Secondary | ICD-10-CM

## 2021-09-23 NOTE — Progress Notes (Signed)
Donna Holmes,  [place]  12-Jan-1951,was seen as subject in a clinical trial /Protocol #[id number] on 23 September 2021. Cardiopulmonary symptoms include some progression in DOE @ incline and cough productive of up to 1 tsp / day of sputum. Palpitations are intermittent but a chronic issue. Pertinent physical findings include: Coarse rales @ lower 1/3 of lung bases, R > L & slight resting tachycardia.. All physical findings NCS. Results of HRCT  scan of chest 09/21/2021 reviewed & discussed with subject.UIP with bronchiectatic changes & aortic atherosclerosis present.  Recommended follow up with PCP as to risk factors in context of aortic atherosclerosis.                                                                      Hendricks Limes MD,SI

## 2021-09-23 NOTE — Telephone Encounter (Signed)
LVM for pt to rtn my call to schedule AWV with [facility] call back # [phone]

## 2021-09-23 NOTE — Research (Addendum)
TITLE: A Phase 2, Randomized, Double-Blind, Placebo-Controlled Study to Evaluate the Safety and Efficacy of [id number] in Patients With Idiopathic Pulmonary Fibrosis  Protocol #: [id number] NCT: [employer]  Protocol Version: version 8: approved 20JUL2022 IB:  version 7.0: approved  ICF: Main: V2.0 approved 08Apr2023, modified [id number]  Pregnancy: V2.0 approved 25Jan2023  Study Design:  This is a randomized, double-blinded, placebo-controlled multicenter study to evaluate the safety and efficacy of [id number] in patients with IPF with or without standard-of-care. 2:1 randomization ratio to [id number] 150 mg BID or the matching placebo for  24 weeks.    Mechanism of Action Proline is one of the largest constituents of collagen. In IPF, there is excessive deposition of collagen. Prolyl-tRNA Synthetase (PRS) is an enzyme that conjugates proline. [id number] (Bersiporocin) is the world's first selective PRS inhibitor that decreases collagen formation and subsequent pro-fibrotic markers.  Administration  A dose of [id number] 150 mg BID will be administered orally in the fasted state or at least 2 hours after the last meal, for 24 weeks.   Key Inclusion Criteria age = 38 years Documented diagnosis of IPF per the 2018 ATS/ERS/JRS/ALAT Clinical Practice Meeting all of the following criteria during the screening period:             FVC ? 40% predicted            DLCOcor ?25% to ? 80%              (FEV1)/FVC ratio ? 0.7             Able to walk at least 150 m in 6MWT, resting SpO2 should be ? 88% with a maximum of 6L O2/min  On a stable dose of pirfenidone OR nintedanib for at least 3 months OR on neither pirfenidone nor nintedanib.  Key Exclusion Criteria Currently taking medication known as a strong CYP2D6 inhibitor OR taking medication known to be CYP2D6 inducers OR CYP2D6 substrate with narrow therapeutic index. GFR  < 30 mL/min/1.85m moderate to severe hepatic  impairment (Child-Pugh B and C). Patients with =3upper limit of normal of alanine aminotransferase, aspartate aminotransferase or gamma-glutamyl transpeptidase. Abnormal ECG findings including but not limited to QTc >500 ms.  Pharmacokinetics Urine PK data indicate that renal elimination is not the major clearance pathway for [id number]   Adverse effects and risk Overall, when [id number] administered concomitantly with Pirfenidone and Nintedanib, [id number] mg enteric-coated tablet was generally well tolerated and safe.   Safety data from edition number [id number] abstracted in March 2023.   Gastrointestinal adverse reactions (e.g Diarrhea, abdominal pain, nausea, vomiting) followed by CNS (headache and dizziness) as below were the most commonly observed adverse reactions.  Most adverse reactions were mild or moderate and reversible.  An improved enteric-coated 150 mg tablet (new) was reformulated to lower the initial dissolution rate in pH 6.8. Enteric-coated tablets (new) demonstrated that nausea and vomiting appear to have decreased. While the incidence rate of diarrhea was slightly increased than old formulation administration, the severity was all mild.  Below table comprises predominantly of enteric coated tablet.  Overall adverse event % n=229 Average of 4 different studies [id number]=24(part 1) Esbriet combination [id number]=24(part 2) Ofev combination   Nausea 54 out of 229 23.58% 6(25%) 2(8.33%)  Vomiting 37 out of 205 18.04% 1(4.17%) 1(4.17%)  Diarrhea 37 out of 229 16.15% 2(8.33%) 7(29.17%)  Abdominal pain 7 out of 229 3.05% 0 1(4.17%)  Constipation 4 out of 54  7.4% - -  Abdominal discomfort  4 out of 205  1.95% 0 1(4.17%)  Headache 25 out of 205  12.19% 4(16.67%) 1(4.17%)  Dizziness 8 out of 175 (4.57%) 3(12.50%) 1(4.17%)   Severity of TEAE Table  Overall adverse event % n=229 Average of 4 different studies [id number] n=24(part 1) Esbriet  combination [id number] n=24(part 2) Ofev combination   TEAE - occurrences  382 19 18  Severity- mild 351 (91.88%) 18 (94.73%) 17 (94.44%)   Moderate 31 (8.11%) 1 (5.26%) 1 (5.55%)  Severe 0 (0%) 0 (0%)  0 (0%)     91% TEAEs were mild  (96 of 106 TEAEs, including all 16 TEAEs in the placebo group). 9% (n=10) were moderate.   TEAEs occurred in a dose dependent manner with 7 of the 10 occurring in daily doses >= 635m  All moderate TEAEs were classed as Gastrointestinal Disorders (nausea, vomiting, diarrhea, and epigastric discomfort), were considered recovered/resolved within 24 hours, and had no sequelae. There were no severe, life-threatening or fatal TEAEs across the study. There were no subjects with serious TEAEs, and there were no subjects with TEAEs leading to IP discontinuation during Part 1 (SAD) of the study.  EKG concerns There were no effects on ECG with dose up to 80 mg/kg in cynomolgus monkeys. In humans no EKG abnormalities reported  except in Part 2 (([id number] concomitantly with Nintedanib), one case of PR prolongation LFT concerns In rats, reversible, minimal DWN12088HCl-related centrilobular hepatocyte hypertrophy was noted in the liver administered ?50 mg/kg/day and was considered consistent with DWN12088HCl-related induction of hepatocellular enzymes.  In humans, on investigation in the MAD study of six subjects, hepatic enzyme was increased in one participant (16.7%).   Rare concerns based on animal data - not seen in humans Excessive salivation in rats  at doses >50 mg/kg/day and mildly diminished appetite in 1 monkey was observed on one occasion.  With extremely high doses of 1200 mg/kg decreased activity, increased/labored/shallow respiration, gasping, vocalizing and piloerection was observed in female (but not female) rats 6h post dose.  [facility] note :   This visit for Subject NEleen Litzwith  DOB: 1Aug 13, 1952on 09/23/2021 for the above protocol is Visit/Encounter # Screening  and is for purpose of research.   Protocol Version: version 8: approved 20JUL2022 IB:  version 7.0: approved  ICF: Main: V2.0 approved 08Apr2023, modified 13Jun23  Pregnancy: V2.0 approved 25Jan2023  Subject [id number] continued interest and consent in continuing as a study subject. Subject confirmed that there was  no change in contact information (e.g. address, telephone, email). Subject thanked for participation in research and contribution to science.  In this visit 09/23/2021 the subject will be evaluated by Sub-Investigator named Dr. WUnice Cobble This research coordinator has verified that the above investigator is up to date with his/her training logs.   The Subject was informed that the PI Dr. MBrand Malescontinues to have oversight of the subject's visits and course  through relevant discussions, reviews and also specifically of this visit by routing of this note to the [place]  This visit is a key visit of Screening The PI is not available for this visit.    Because the PI is NOT available, the sub-I reported and CRC has confirmed that the PI has discussed the visit a-prior with the sub-investigator.    Signed by  LJaye BeagleRN, MSN/MBA  Clinical Research Coordinator / Nurse [place]12:03 PM 09/23/2021

## 2021-09-25 ENCOUNTER — Ambulatory Visit (INDEPENDENT_AMBULATORY_CARE_PROVIDER_SITE_OTHER): Payer: Medicare Other | Admitting: Pulmonary Disease

## 2021-09-25 ENCOUNTER — Encounter: Payer: Self-pay | Admitting: Pulmonary Disease

## 2021-09-25 ENCOUNTER — Ambulatory Visit: Payer: Medicare Other | Admitting: Pulmonary Disease

## 2021-09-25 VITALS — BP 118/72 | HR 88 | Ht 63.0 in | Wt 135.0 lb

## 2021-09-25 DIAGNOSIS — J84112 Idiopathic pulmonary fibrosis: Secondary | ICD-10-CM | POA: Diagnosis not present

## 2021-09-25 NOTE — Patient Instructions (Signed)
Continue taking pirfenidone 3 times daily  Your CT Chest and breathing tests show moderate progression of disease  We will continue to monitor closely  Follow up in 6 months

## 2021-09-25 NOTE — Progress Notes (Signed)
Synopsis: Referred in July 202 for shortness of breath by Cathlean Cower, MD  Subjective:   PATIENT ID: Donna Holmes GENDER: female DOB: 02/24/1951, MRN: [id number]  HPI  Chief Complaint  Patient presents with   Follow-up    F/U after PFT. Noticed an increased in coughing over the past month. Phlegm has been clear.    Donna Holmes is a 71 year old woman, never smoker who returns to pulmonary clinic for follow up of pulmonary fibrosis.  HRCT Chest 09/21/21 does show progression of disease. Unable to perform PFTs today due to the pft machine being in need of repair.  Liver function testing remains within normal limits.  She continues on esbriet. She has intermittent diarrhea and stomach pains.   She is being evaluated for clinical trial for her pulmonary fibrosis.   New mucous production 2 months ago, coming from her lungs.   Travels to [place] 04/08/21 Patient was started on Esbriet therapy in August 2022.  Needs to tolerate the medicine okay.  She is eating more in order to reduce any GI side effects from the [place] which seems to be working.  She reports her sense of taste is absent.  She is interested in learning more about investigational studies regarding pulmonary fibrosis.  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 She met with our pharmacy team and has been started on esbriet therapy. She is currently taking esbriet 3 x 29m tablet, three times daily. She has been ordered 8032mtablets to be taken 3 times daily and is awaiting this prescription. She complains of stomch discomfort since taking the medication which is alleviated by eating food. She also complains of headaches every other day that are relieved by acetaminophen.   She is walking 2 miles per day. She is using breo ellipta daily and as needed albuterol with relief.   OV 09/18/20 She has  noticed shortness of breath for a few months with some wheezing, dry cough and chest tightness. The dyspnea is most notable with exertion. She denies any recent infections and has not had covid. She denies any respiratory issues in childhood.   She does report reflux symptoms and has been taking pantoprazole 4084maily. She denies any sinus congestion and post-nasal drainage. She denies any muscle aches, morning stiffness of skin rashes.  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/2426Complication of anesthesia    GERD (gastroesophageal reflux disease)    Headache(784.0) 05/19/2009     Family History  Problem Relation Age of Onset   Alcohol abuse Father    Hypertension Mother    Prostate cancer Maternal Uncle      Social History   Socioeconomic History   Marital status: Married    Spouse name: Not on file   Number of children: 1   Years of education: Not on file   Highest education level: Not on file  Occupational History   Occupation: nurse retired  Tobacco Use   Smoking status: Never   Smokeless tobacco: Never  Vaping Use   Vaping Use: Never used  Substance and Sexual Activity   Alcohol use: No   Drug use: No   Sexual activity: Not Currently  Other Topics Concern   Not on file  Social History  Narrative   Not on file   Social Determinants of Health   Financial Resource Strain: Low Risk  (05/06/2017)   Overall Financial Resource Strain (CARDIA)    Difficulty of Paying Living Expenses: Not hard at all  Food Insecurity: No Food Insecurity (05/06/2017)   Hunger Vital Sign    Worried About Running Out of Food in the Last Year: Never true    Ran Out of Food in the Last Year: Never true  Transportation Needs: No Transportation Needs (05/06/2017)   PRAPARE - Hydrologist (Medical): No    Lack of Transportation (Non-Medical): No   Physical Activity: Sufficiently Active (05/06/2017)   Exercise Vital Sign    Days of Exercise per Week: 4 days    Minutes of Exercise per Session: 50 min  Stress: No Stress Concern Present (05/08/2018)   [place]    Feeling of Stress : Not at all  Social Connections: Unknown (05/08/2018)   Social Connection and Isolation Panel [NHANES]    Frequency of Communication with Friends and Family: Not on file    Frequency of Social Gatherings with Friends and Family: Not on file    Attends Religious Services: Not on file    Active Member of Clubs or Organizations: Yes    Attends Archivist Meetings: More than 4 times per year    Marital Status: Married  Human resources officer Violence: Not At Risk (05/06/2017)   Humiliation, Afraid, Rape, and Kick questionnaire    Fear of Current or Ex-Partner: No    Emotionally Abused: No    Physically Abused: No    Sexually Abused: No     Allergies  Allergen Reactions   Aspirin    Codeine Nausea And Vomiting   Oxycontin [Oxycodone Hcl] Nausea Only and Other (See Comments)    FELT LIKE I WAS GOING TO FAINT   Statins Other (See Comments)    myalgias   Sulfa Antibiotics Nausea And Vomiting   Ibuprofen Other (See Comments)    Other reaction(s): GI Bleed     Outpatient Medications Prior to Visit  Medication Sig Dispense Refill   acetaminophen (TYLENOL) 500 MG tablet Take 500 mg by mouth every 6 (six) hours as needed.     albuterol (VENTOLIN HFA) 108 (90 Base) MCG/ACT inhaler TAKE 2 PUFFS BY MOUTH EVERY 6 HOURS AS NEEDED FOR WHEEZE OR SHORTNESS OF BREATH 8.5 each 5   Ascorbic Acid (VITAMIN C) 250 MG CHEW Chew 250 mg by mouth daily.     BREO ELLIPTA 100-25 MCG/ACT AEPB TAKE 1 PUFF BY MOUTH ONCE DAILY 60 each 3   calcium carbonate (OS-CAL) 600 MG TABS tablet Take 600 mg by mouth daily.     cetirizine-pseudoephedrine (ZYRTEC-D) 5-120 MG per tablet Take 1 tablet by mouth once as needed  for allergies.     Cholecalciferol (VITAMIN D-3 PO) Take 4,000 Units by mouth daily.     CRANBERRY PO Take by mouth.     estradiol (ESTRACE) 0.1 MG/GM vaginal cream Place 1 Applicatorful vaginally 2 (two) times a week.     Estradiol-Estriol-Progesterone (BIEST/PROGESTERONE) CREA Apply 2 clicks daily, except on Sunday. 40 g PRN   Glucosamine-Chondroitin (GLUCOSAMINE CHONDR COMPLEX PO) Take 1 tablet by mouth daily.     Omega-3 Fatty Acids (FISH OIL) 1000 MG CAPS Take 3 capsules by mouth daily.      pantoprazole (PROTONIX) 40 MG tablet TAKE 1 TABLET BY MOUTH EVERY DAY 90  tablet 2   Pirfenidone (ESBRIET) 801 MG TABS Take 1 tablet (801 mg) by mouth with breakfast, with lunch, and with evening meal. 270 tablet 1   RESTASIS 0.05 % ophthalmic emulsion 1 drop 2 (two) times daily.     Vitamin A 2400 MCG (8000 UT) CAPS Take 2,400 mcg by mouth daily.     Vitamin E 180 MG (400 UNIT) CAPS Take 180 mg by mouth daily.     trimethoprim (TRIMPEX) 100 MG tablet 1 tab by mouth on Mon - Wed - Fri (Patient not taking: Reported on 05/11/2021) 36 tablet 3   No facility-administered medications prior to visit.    Review of Systems  Constitutional:  Negative for chills, fever, malaise/fatigue and weight loss.  HENT:  Negative for congestion, sinus pain and sore throat.   Eyes: Negative.   Respiratory:  Negative for cough, hemoptysis, sputum production, shortness of breath and wheezing.   Cardiovascular:  Negative for chest pain, palpitations, orthopnea, claudication and leg swelling.  Gastrointestinal:  Positive for abdominal pain. Negative for heartburn, nausea and vomiting.  Genitourinary: Negative.   Musculoskeletal:  Negative for joint pain and myalgias.  Skin:  Negative for rash.  Neurological:  Positive for headaches. Negative for weakness.  Endo/Heme/Allergies: Negative.   Psychiatric/Behavioral: Negative.       Objective:   Vitals:   09/25/21 1106  BP: 118/72  Pulse: 88  SpO2: 100%  Weight: 135  lb (61.2 kg)  Height: _0  (1.6 m)     Physical Exam Constitutional:      General: She is not in acute distress.    Appearance: She is not ill-appearing.  HENT:     Head: Normocephalic and atraumatic.  Eyes:     General: No scleral icterus.    Conjunctiva/sclera: Conjunctivae normal.     Pupils: Pupils are equal, round, and reactive to light.  Cardiovascular:     Rate and Rhythm: Normal rate and regular rhythm.     Pulses: Normal pulses.     Heart sounds: Normal heart sounds. No murmur heard. Pulmonary:     Effort: Pulmonary effort is normal.     Breath sounds: Rales (bibasilar) present. No wheezing or rhonchi.  Abdominal:     General: Bowel sounds are normal.     Palpations: Abdomen is soft.  Musculoskeletal:     Right lower leg: No edema.     Left lower leg: No edema.  Lymphadenopathy:     Cervical: No cervical adenopathy.  Skin:    General: Skin is warm and dry.  Neurological:     General: No focal deficit present.     Mental Status: She is alert.    CBC    Component Value Date/Time   WBC 7.7 04/22/2021 1036   RBC 4.25 04/22/2021 1036   HGB 12.0 04/22/2021 1036   HGB 12.8 12/16/2017 1249   HCT 36.9 04/22/2021 1036   HCT 38.4 12/16/2017 1249   PLT 328.0 04/22/2021 1036   PLT 307 12/16/2017 1249   MCV 86.8 04/22/2021 1036   MCV 88 12/16/2017 1249   MCH 29.3 12/16/2017 1249   MCH 29.9 05/17/2014 0529   MCHC 32.7 04/22/2021 1036   RDW 14.9 04/22/2021 1036   RDW 13.7 12/16/2017 1249   LYMPHSABS 2.6 04/22/2021 1036   MONOABS 0.7 04/22/2021 1036   EOSABS 0.1 04/22/2021 1036   BASOSABS 0.1 04/22/2021 1036      Latest Ref Rng & Units 04/22/2021   10:36 AM 03/19/2021   12:01  PM 02/17/2021    1:59 PM  BMP  Glucose 70 - 99 mg/dL 90  87  106   BUN 6 - 23 mg/dL _0 Creatinine 0.40 - 1.20 mg/dL 0.85  0.80  0.67   Sodium 135 - 145 mEq/L 140  139  140   Potassium 3.5 - 5.1 mEq/L 4.4  4.0  3.9   Chloride 96 - 112 mEq/L 102  103  106   CO2 19 - 32 mEq/L _1 Calcium 8.4 - 10.5 mg/dL 10.0  9.9  9.5    Chest imaging: HRCT Chest 09/21/21 Lungs/Pleura: High-resolution images demonstrate widespread areas of ground-glass attenuation, septal thickening, subpleural reticulation, cylindrical bronchiectasis and peripheral bronchiolectasis scattered throughout the lungs bilaterally, with a definitive craniocaudal gradient. These findings are clearly progressive compared to the prior study. Inspiratory and expiratory imaging is unremarkable. No acute consolidative airspace disease. No pleural effusions. No definite suspicious appearing pulmonary nodules or masses are noted.  HRCT Chest 10/03/20 Moderate patchy confluent subpleural reticulation and ground-glass opacity throughout both lungs with associated mild-to-moderate traction bronchiectasis and mild architectural distortion. There is a basilar predominance to these findings. No frank honeycombing. Findings are categorized as probable UIP per consensus guidelines.  CXR 07/25/20 Heart size and mediastinal contours are within normal limits. Study is hypoinspiratory with crowding of the perihilar and bibasilar bronchovascular markings. Given the low lung volumes, lungs are clear, possible mild atelectasis at the LEFT lung base. No pleural effusion or pneumothorax. Osseous structures about the chest are unremarkable.  PFT:    Latest Ref Rng & Units 09/23/2020   10:49 AM  PFT Results  FVC-Pre L 1.82   FVC-Predicted Pre % 64   FVC-Post L 1.81   FVC-Predicted Post % 64   Pre FEV1/FVC % % 81   Post FEV1/FCV % % 82   FEV1-Pre L 1.47   FEV1-Predicted Pre % 69   FEV1-Post L 1.49   DLCO uncorrected ml/min/mmHg 13.70   DLCO UNC% % 73   DLVA Predicted % 104   TLC L 2.88   TLC % Predicted % 58   RV % Predicted % 46   PFT 2022 - Moderate restriction and minimal diffusion defect  Echo 2012: LV EF 55-60%. Grade I diastolic dysfunction.    Assessment & Plan:   No diagnosis  found.  Discussion: Donna Holmes is a 71 year old woman, never smoker who returns to pulmonary clinic for follow up of pulmonary fibrosis.  She is tolerating Esbriet therapy well.  She is to continue Esbriet 801 mg tablet, 3 times daily.  She is to continue breo ellipta 1 puff daily. She can continue as needed albuterol as well.  She is to continue PPI therapy for her GERD.   Follow up in July with pulmonary function tests and repeat CT chest scan.  Freda Jackson, MD [facility] Office: [phone]   Current Outpatient Medications:    acetaminophen (TYLENOL) 500 MG tablet, Take 500 mg by mouth every 6 (six) hours as needed., Disp: , Rfl:    albuterol (VENTOLIN HFA) 108 (90 Base) MCG/ACT inhaler, TAKE 2 PUFFS BY MOUTH EVERY 6 HOURS AS NEEDED FOR WHEEZE OR SHORTNESS OF BREATH, Disp: 8.5 each, Rfl: 5   Ascorbic Acid (VITAMIN C) 250 MG CHEW, Chew 250 mg by mouth daily., Disp: , Rfl:    BREO ELLIPTA 100-25 MCG/ACT AEPB, TAKE 1 PUFF BY MOUTH ONCE DAILY, Disp: 60  each, Rfl: 3   calcium carbonate (OS-CAL) 600 MG TABS tablet, Take 600 mg by mouth daily., Disp: , Rfl:    cetirizine-pseudoephedrine (ZYRTEC-D) 5-120 MG per tablet, Take 1 tablet by mouth once as needed for allergies., Disp: , Rfl:    Cholecalciferol (VITAMIN D-3 PO), Take 4,000 Units by mouth daily., Disp: , Rfl:    CRANBERRY PO, Take by mouth., Disp: , Rfl:    estradiol (ESTRACE) 0.1 MG/GM vaginal cream, Place 1 Applicatorful vaginally 2 (two) times a week., Disp: , Rfl:    Estradiol-Estriol-Progesterone (BIEST/PROGESTERONE) CREA, Apply 2 clicks daily, except on Sunday., Disp: 40 g, Rfl: PRN   Glucosamine-Chondroitin (GLUCOSAMINE CHONDR COMPLEX PO), Take 1 tablet by mouth daily., Disp: , Rfl:    Omega-3 Fatty Acids (FISH OIL) 1000 MG CAPS, Take 3 capsules by mouth daily. , Disp: , Rfl:    pantoprazole (PROTONIX) 40 MG tablet, TAKE 1 TABLET BY MOUTH EVERY DAY, Disp: 90 tablet, Rfl: 2   Pirfenidone  (ESBRIET) 801 MG TABS, Take 1 tablet (801 mg) by mouth with breakfast, with lunch, and with evening meal., Disp: 270 tablet, Rfl: 1   RESTASIS 0.05 % ophthalmic emulsion, 1 drop 2 (two) times daily., Disp: , Rfl:    Vitamin A 2400 MCG (8000 UT) CAPS, Take 2,400 mcg by mouth daily., Disp: , Rfl:    Vitamin E 180 MG (400 UNIT) CAPS, Take 180 mg by mouth daily., Disp: , Rfl:

## 2021-09-25 NOTE — Patient Instructions (Signed)
Full PFT performed today.

## 2021-09-25 NOTE — Progress Notes (Signed)
Full PFT performed today.

## 2021-09-26 LAB — PULMONARY FUNCTION TEST
DL/VA % pred: 106 %
DL/VA: 4.43 ml/min/mmHg/L
DLCO cor % pred: 50 %
DLCO cor: 9.42 ml/min/mmHg
DLCO unc % pred: 50 %
DLCO unc: 9.42 ml/min/mmHg
FEF 25-75 Post: 1.48 L/sec
FEF 25-75 Pre: 1.55 L/sec
FEF2575-%Change-Post: -4 %
FEF2575-%Pred-Post: 87 %
FEF2575-%Pred-Pre: 91 %
FEV1-%Change-Post: 3 %
FEV1-%Pred-Post: 60 %
FEV1-%Pred-Pre: 58 %
FEV1-Post: 1.27 L
FEV1-Pre: 1.22 L
FEV1FVC-%Change-Post: 2 %
FEV1FVC-%Pred-Pre: 104 %
FEV6-%Change-Post: 1 %
FEV6-%Pred-Post: 59 %
FEV6-%Pred-Pre: 58 %
FEV6-Post: 1.56 L
FEV6-Pre: 1.55 L
FEV6FVC-%Pred-Post: 105 %
FEV6FVC-%Pred-Pre: 105 %
FVC-%Change-Post: 1 %
FVC-%Pred-Post: 56 %
FVC-%Pred-Pre: 55 %
FVC-Post: 1.56 L
FVC-Pre: 1.55 L
Post FEV1/FVC ratio: 81 %
Post FEV6/FVC ratio: 100 %
Pre FEV1/FVC ratio: 79 %
Pre FEV6/FVC Ratio: 100 %
RV % pred: 57 %
RV: 1.26 L
TLC % pred: 53 %
TLC: 2.63 L

## 2021-09-28 ENCOUNTER — Encounter: Payer: Self-pay | Admitting: Pulmonary Disease

## 2021-09-28 DIAGNOSIS — J84112 Idiopathic pulmonary fibrosis: Secondary | ICD-10-CM

## 2021-10-01 ENCOUNTER — Other Ambulatory Visit (HOSPITAL_COMMUNITY): Payer: Self-pay

## 2021-10-01 ENCOUNTER — Other Ambulatory Visit: Payer: Self-pay | Admitting: Pulmonary Disease

## 2021-10-01 DIAGNOSIS — J84112 Idiopathic pulmonary fibrosis: Secondary | ICD-10-CM

## 2021-10-01 MED ORDER — PIRFENIDONE 801 MG PO TABS
801.0000 mg | ORAL_TABLET | Freq: Three times a day (TID) | ORAL | 1 refills | Status: DC
  Filled 2021-10-01: qty 270, 90d supply, fill #0
  Filled 2022-01-08: qty 270, 90d supply, fill #1

## 2021-10-06 ENCOUNTER — Other Ambulatory Visit: Payer: Self-pay | Admitting: Internal Medicine

## 2021-10-06 ENCOUNTER — Telehealth: Payer: Self-pay | Admitting: *Deleted

## 2021-10-06 DIAGNOSIS — Z006 Encounter for examination for normal comparison and control in clinical research program: Secondary | ICD-10-CM

## 2021-10-06 DIAGNOSIS — J84112 Idiopathic pulmonary fibrosis: Secondary | ICD-10-CM

## 2021-10-06 NOTE — Telephone Encounter (Signed)
-----  Message from Biagio Borg, MD sent at 09/26/2021  8:55 PM EDT ----- Please to contact pt - please make ROV to look into cardiac part to her shortness of breath,   thanks ----- Message ----- From: Hendricks Limes, MD Sent: 09/25/2021   4:55 PM EDT To: Rickey Primus, RN; Biagio Borg, MD; #  She was seen @ [facility] this week . Her DOE is most likely due to progressive PF; but HRCT also shows aortic atherosclerosis. This is in context of high LDL as subjectively intolerant to statin. I hope the DOE is not also an anginal variant. I recommended she see Dr Jenny Reichmann to reassess addressing cardiac risks. [employer]

## 2021-10-06 NOTE — Telephone Encounter (Signed)
Per chart pt has appt 10/21/21 w/ Dr. Jenny Reichmann.Marland KitchenJohny Holmes

## 2021-10-07 ENCOUNTER — Other Ambulatory Visit (HOSPITAL_COMMUNITY): Payer: Self-pay

## 2021-10-21 ENCOUNTER — Ambulatory Visit: Payer: Medicare Other | Admitting: Internal Medicine

## 2021-10-21 ENCOUNTER — Encounter: Payer: Self-pay | Admitting: Internal Medicine

## 2021-10-21 VITALS — BP 118/76 | HR 105 | Temp 98.7°F | Ht 63.0 in | Wt 133.8 lb

## 2021-10-21 DIAGNOSIS — R739 Hyperglycemia, unspecified: Secondary | ICD-10-CM

## 2021-10-21 DIAGNOSIS — E538 Deficiency of other specified B group vitamins: Secondary | ICD-10-CM

## 2021-10-21 DIAGNOSIS — R0600 Dyspnea, unspecified: Secondary | ICD-10-CM | POA: Diagnosis not present

## 2021-10-21 DIAGNOSIS — I700 Atherosclerosis of aorta: Secondary | ICD-10-CM

## 2021-10-21 DIAGNOSIS — E7800 Pure hypercholesterolemia, unspecified: Secondary | ICD-10-CM | POA: Diagnosis not present

## 2021-10-21 MED ORDER — ROSUVASTATIN CALCIUM 10 MG PO TABS: 10.0000 mg | ORAL_TABLET | Freq: Every day | ORAL | 3 refills | Status: DC

## 2021-10-21 NOTE — Progress Notes (Addendum)
Patient ID: Donna Holmes, female   DOB: 12-19-1950, 71 y.o.   MRN: [id number]        Chief Complaint: follow up recent worsening doe - ? PF vs anginal equivalent, hld, aortic atherosclerosis, hyperglycemia, b12 deficiency       HPI:  Donna Holmes is a 71 y.o. female here overall diong well but has persistent reproducible DOE, possibly related to worsening pulm fibrosis vs other, has f/u with [facility] tomorrow.  Pt denies chest pain, wheezing, orthopnea, PND, increased LE swelling, palpitations, dizziness or syncope.   Pt denies polydipsia, polyuria, or new focal neuro s/s.    Pt denies fever, wt loss, night sweats, loss of appetite, or other constitutional symptoms  Pt does not want stress testing  Last echo 2012 with normal EF.         Wt Readings from Last 3 Encounters:  10/21/21 133 lb 12.8 oz (60.7 kg)  09/25/21 135 lb (61.2 kg)  05/11/21 136 lb (61.7 kg)   BP Readings from Last 3 Encounters:  10/21/21 118/76  09/25/21 118/72  05/11/21 108/66         Past Medical History:  Diagnosis Date   ALLERGIC RHINITIS 05/19/2009   Anemia, iron deficiency 06/09/2015   B12 deficiency 08/02/2018   Chagas' disease 09/05/7626   Complication of anesthesia    GERD (gastroesophageal reflux disease)    Headache(784.0) 05/19/2009   Past Surgical History:  Procedure Laterality Date   ANTERIOR AND POSTERIOR REPAIR WITH SACROSPINOUS FIXATION N/A 05/16/2014   Procedure: ANTERIOR AND POSTERIOR REPAIR WITH SACROSPINOUS LIGAMENT SUSPENSION;  Surgeon: Luz Lex, MD;  Location: [facility];  Service: Gynecology;  Laterality: N/A;   BARTHOLIN CYST MARSUPIALIZATION     BUNIONECTOMY Right    LAPAROSCOPIC ASSISTED VAGINAL HYSTERECTOMY N/A 05/16/2014   Procedure: LAPAROSCOPIC ASSISTED VAGINAL HYSTERECTOMY;  Surgeon: Luz Lex, MD;  Location: [facility];  Service: Gynecology;  Laterality: N/A;   NEUROMA SURGERY Right    2nd interspace    reports that she has never smoked. She has never used smokeless  tobacco. She reports that she does not drink alcohol and does not use drugs. family history includes Alcohol abuse in her father; Hypertension in her mother; Prostate cancer in her maternal uncle. Allergies  Allergen Reactions   Aspirin    Codeine Nausea And Vomiting   Oxycontin [Oxycodone Hcl] Nausea Only and Other (See Comments)    FELT LIKE I WAS GOING TO FAINT   Statins Other (See Comments)    myalgias   Sulfa Antibiotics Nausea And Vomiting   Ibuprofen Other (See Comments)    Other reaction(s): GI Bleed   Current Outpatient Medications on File Prior to Visit  Medication Sig Dispense Refill   acetaminophen (TYLENOL) 500 MG tablet Take 500 mg by mouth every 6 (six) hours as needed.     albuterol (VENTOLIN HFA) 108 (90 Base) MCG/ACT inhaler TAKE 2 PUFFS BY MOUTH EVERY 6 HOURS AS NEEDED FOR WHEEZE OR SHORTNESS OF BREATH 8.5 each 5   Ascorbic Acid (VITAMIN C) 250 MG CHEW Chew 250 mg by mouth daily.     BREO ELLIPTA 100-25 MCG/ACT AEPB TAKE 1 PUFF BY MOUTH ONCE DAILY 60 each 3   calcium carbonate (OS-CAL) 600 MG TABS tablet Take 600 mg by mouth daily.     cetirizine-pseudoephedrine (ZYRTEC-D) 5-120 MG per tablet Take 1 tablet by mouth once as needed for allergies.     Cholecalciferol (VITAMIN D-3 PO) Take 4,000 Units by mouth daily.  CRANBERRY PO Take by mouth.     estradiol (ESTRACE) 0.1 MG/GM vaginal cream Place 1 Applicatorful vaginally 2 (two) times a week.     Estradiol-Estriol-Progesterone (BIEST/PROGESTERONE) CREA Apply 2 clicks daily, except on Sunday. 40 g PRN   Glucosamine-Chondroitin (GLUCOSAMINE CHONDR COMPLEX PO) Take 1 tablet by mouth daily.     Omega-3 Fatty Acids (FISH OIL) 1000 MG CAPS Take 3 capsules by mouth daily.      pantoprazole (PROTONIX) 40 MG tablet TAKE 1 TABLET BY MOUTH EVERY DAY 90 tablet 2   Pirfenidone (ESBRIET) 801 MG TABS Take 1 tablet (801 mg) by mouth with breakfast, with lunch, and with evening meal. 270 tablet 1   RESTASIS 0.05 % ophthalmic  emulsion 1 drop 2 (two) times daily.     Vitamin A 2400 MCG (8000 UT) CAPS Take 2,400 mcg by mouth daily.     Vitamin E 180 MG (400 UNIT) CAPS Take 180 mg by mouth daily.     No current facility-administered medications on file prior to visit.        ROS:  All others reviewed and negative.  Objective        PE:  BP 118/76 (BP Location: Left Arm, Patient Position: Sitting, Cuff Size: Normal)   Pulse (!) 105   Temp 98.7 F (37.1 C) (Oral)   Ht _0  (1.6 m)   Wt 133 lb 12.8 oz (60.7 kg)   SpO2 100%   BMI 23.70 kg/m                 Constitutional: Pt appears in NAD               HENT: Head: NCAT.                Right Ear: External ear normal.                 Left Ear: External ear normal.                Eyes: . Pupils are equal, round, and reactive to light. Conjunctivae and EOM are normal               Nose: without d/c or deformity               Neck: Neck supple. Gross normal ROM               Cardiovascular: Normal rate and regular rhythm.                 Pulmonary/Chest: Effort normal and breath sounds without rales or wheezing.                Abd:  Soft, NT, ND, + BS, no organomegaly               Neurological: Pt is alert. At baseline orientation, motor grossly intact               Skin: Skin is warm. No rashes, no other new lesions, LE edema - trace bilateral               Psychiatric: Pt behavior is normal without agitation   Micro: none  Cardiac tracings I have personally interpreted today:  none  Pertinent Radiological findings (summarize): none   Lab Results  Component Value Date   WBC 7.7 04/22/2021   HGB 12.0 04/22/2021   HCT 36.9 04/22/2021   PLT 328.0 04/22/2021   GLUCOSE 90 04/22/2021  CHOL 274 (H) 04/22/2021   TRIG 288.0 (H) 04/22/2021   HDL 45.40 04/22/2021   LDLDIRECT 194.0 04/22/2021   LDLCALC 124 (H) 08/01/2018   ALT 25 09/07/2021   AST 27 09/07/2021   NA 140 04/22/2021   K 4.4 04/22/2021   CL 102 04/22/2021   CREATININE 0.85 04/22/2021    BUN 25 (H) 04/22/2021   CO2 27 04/22/2021   TSH 1.42 04/22/2021   HGBA1C 6.0 04/22/2021   Assessment/Plan:  Donna Holmes is a 71 y.o. White or Caucasian [1] female with  has a past medical history of ALLERGIC RHINITIS (05/19/2009), Anemia, iron deficiency (06/09/2015), B12 deficiency (08/02/2018), Chagas' disease (32/67/1245), Complication of anesthesia, GERD (gastroesophageal reflux disease), and Headache(784.0) (05/19/2009).  Hyperlipidemia Lab Results  Component Value Date   LDLCALC 124 (H) 08/01/2018   Uncontrolled, goal ldl < 70, has had some statin intolerance in past, willing to try crestor 10 qd, low chol diet   Hyperglycemia Lab Results  Component Value Date   HGBA1C 6.0 04/22/2021   Stable, pt to continue current medical treatment  - diet, wt control, activity   B12 deficiency Lab Results  Component Value Date   VITAMINB12 267 04/22/2021   Low, to start oral replacement - b12 1000 mcg qd   Aortic atherosclerosis (HCC) Pt to start crestor 10 qd, exercise, low chol diet  Dyspnea Pt not interested in echo or stress testing for now, ok for Card Ct Score  Followup: Return in about 6 months (around 04/23/2022).  Cathlean Cower, MD 10/23/2021 9:34 PM [facility]

## 2021-10-21 NOTE — Patient Instructions (Signed)
Please take all new medication as prescribed - the low dose crestor 10 mg per day  You will be contacted regarding the referral for: Cardiac CT score  Please continue all other medications as before, and refills have been done if requested.  Please have the pharmacy call with any other refills you may need.  Please continue your efforts at being more active, low cholesterol diet, and weight control.  Please keep your appointments with your specialists as you may have planned - [facility] tomorrow  Please make an Appointment to return in 6 months, or sooner if needed

## 2021-10-23 ENCOUNTER — Encounter: Payer: Self-pay | Admitting: Internal Medicine

## 2021-10-23 NOTE — Assessment & Plan Note (Signed)
Pt to start crestor 10 qd, exercise, low chol diet

## 2021-10-23 NOTE — Assessment & Plan Note (Signed)
Lab Results  Component Value Date   HGBA1C 6.0 04/22/2021   Stable, pt to continue current medical treatment  - diet, wt control, activity

## 2021-10-23 NOTE — Assessment & Plan Note (Signed)
Lab Results  Component Value Date   LDLCALC 124 (H) 08/01/2018   Uncontrolled, goal ldl < 70, has had some statin intolerance in past, willing to try crestor 10 qd, low chol diet

## 2021-10-23 NOTE — Assessment & Plan Note (Signed)
Lab Results  Component Value Date   VITAMINB12 267 04/22/2021   Low, to start oral replacement - b12 1000 mcg qd

## 2021-10-23 NOTE — Assessment & Plan Note (Signed)
Pt not interested in echo or stress testing for now, ok for Card Ct Score

## 2021-10-30 ENCOUNTER — Other Ambulatory Visit (HOSPITAL_COMMUNITY): Payer: Self-pay

## 2021-10-31 ENCOUNTER — Encounter: Payer: Self-pay | Admitting: Internal Medicine

## 2021-11-03 ENCOUNTER — Other Ambulatory Visit (HOSPITAL_BASED_OUTPATIENT_CLINIC_OR_DEPARTMENT_OTHER): Payer: Medicare Other

## 2021-11-03 ENCOUNTER — Ambulatory Visit (HOSPITAL_BASED_OUTPATIENT_CLINIC_OR_DEPARTMENT_OTHER): Admission: RE | Admit: 2021-11-03 | Payer: Medicare Other | Source: Ambulatory Visit

## 2021-11-06 ENCOUNTER — Other Ambulatory Visit (HOSPITAL_BASED_OUTPATIENT_CLINIC_OR_DEPARTMENT_OTHER): Payer: Medicare Other

## 2021-11-08 ENCOUNTER — Encounter: Payer: Self-pay | Admitting: Pharmacist

## 2021-11-10 ENCOUNTER — Encounter: Payer: Self-pay | Admitting: Internal Medicine

## 2021-11-10 ENCOUNTER — Ambulatory Visit: Payer: Medicare Other | Admitting: Internal Medicine

## 2021-11-10 VITALS — BP 106/78 | HR 96 | Temp 98.1°F | Ht 63.0 in | Wt 130.4 lb

## 2021-11-10 DIAGNOSIS — N951 Menopausal and female climacteric states: Secondary | ICD-10-CM

## 2021-11-10 DIAGNOSIS — I700 Atherosclerosis of aorta: Secondary | ICD-10-CM

## 2021-11-10 DIAGNOSIS — Z79890 Hormone replacement therapy (postmenopausal): Secondary | ICD-10-CM

## 2021-11-10 DIAGNOSIS — J84112 Idiopathic pulmonary fibrosis: Secondary | ICD-10-CM | POA: Diagnosis not present

## 2021-11-10 NOTE — Progress Notes (Signed)
I,Victoria T Hamilton,acting as a scribe for Maximino Greenland, MD.,have documented all relevant documentation on the behalf of Maximino Greenland, MD,as directed by  Maximino Greenland, MD while in the presence of Maximino Greenland, MD.    Subjective:     Patient ID: Donna Holmes , female    DOB: 1951/01/15 , 71 y.o.   MRN: [id number]   Chief Complaint  Patient presents with   BHRT f/u    HPI  The patient is here today for a follow-up on her hormones. She feels well on her current regimen. She has no specific concerns or complaints at this time. She was recently seen by her PCP and had labs performed. She has been using BHRT Biest along with vaginal Estrace.      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   Complication of anesthesia    GERD (gastroesophageal reflux disease)    Headache(784.0) 05/19/2009     Family History  Problem Relation Age of Onset   Alcohol abuse Father    Hypertension Mother    Prostate cancer Maternal Uncle      Current Outpatient Medications:    acetaminophen (TYLENOL) 500 MG tablet, Take 500 mg by mouth every 6 (six) hours as needed., Disp: , Rfl:    albuterol (VENTOLIN HFA) 108 (90 Base) MCG/ACT inhaler, TAKE 2 PUFFS BY MOUTH EVERY 6 HOURS AS NEEDED FOR WHEEZE OR SHORTNESS OF BREATH, Disp: 8.5 each, Rfl: 5   Ascorbic Acid (VITAMIN C) 250 MG CHEW, Chew 250 mg by mouth daily., Disp: , Rfl:    calcium carbonate (OS-CAL) 600 MG TABS tablet, Take 600 mg by mouth daily., Disp: , Rfl:    cetirizine-pseudoephedrine (ZYRTEC-D) 5-120 MG per tablet, Take 1 tablet by mouth once as needed for allergies., Disp: , Rfl:    Cholecalciferol (VITAMIN D-3 PO), Take 4,000 Units by mouth daily., Disp: , Rfl:    CRANBERRY PO, Take by mouth., Disp: , Rfl:    estradiol (ESTRACE) 0.1 MG/GM vaginal cream, Place 1 Applicatorful vaginally 2 (two) times a week.,  Disp: , Rfl:    Estradiol-Estriol-Progesterone (BIEST/PROGESTERONE) CREA, Apply 2 clicks daily, except on Sunday., Disp: 40 g, Rfl: PRN   Glucosamine-Chondroitin (GLUCOSAMINE CHONDR COMPLEX PO), Take 1 tablet by mouth daily., Disp: , Rfl:    Omega-3 Fatty Acids (FISH OIL) 1000 MG CAPS, Take 3 capsules by mouth daily. , Disp: , Rfl:    pantoprazole (PROTONIX) 40 MG tablet, TAKE 1 TABLET BY MOUTH EVERY DAY, Disp: 90 tablet, Rfl: 2   Pirfenidone (ESBRIET) 801 MG TABS, Take 1 tablet (801 mg) by mouth with breakfast, with lunch, and with evening meal., Disp: 270 tablet, Rfl: 1   RESTASIS 0.05 % ophthalmic emulsion, 1 drop 2 (two) times daily., Disp: , Rfl:    rosuvastatin (CRESTOR) 10 MG tablet, Take 1 tablet (10 mg total) by mouth daily., Disp: 90 tablet, Rfl: 3   Vitamin A 2400 MCG (8000 UT) CAPS, Take 2,400 mcg by mouth daily., Disp: , Rfl:    Vitamin E 180 MG (400 UNIT) CAPS, Take 180 mg by mouth daily., Disp: , Rfl:    BREO ELLIPTA 100-25 MCG/ACT AEPB, TAKE 1 PUFF BY MOUTH ONCE DAILY (Patient not taking: Reported on 11/10/2021), Disp: 60 each, Rfl: 3   Allergies  Allergen Reactions   Ambrisentan Other (See Comments)    a) absolute  contraindication during study expected to last 6 months b) contact PI if medicine needs to be given (Dr. Brand Males [phone] or [phone])   Amitriptyline Other (See Comments)    a) absolute contraindication during study expected to last 6 months b) contact PI if medicine needs to be given (Dr. Brand Males [phone] or [phone])   Amoxapine And Related Other (See Comments)    a) absolute contraindication during study expected to last 6 months b) contact PI if medicine needs to be given (Dr. Brand Males [phone] or [phone])   Aspirin    Azathioprine Other (See Comments)    a) absolute contraindication during study expected to last 6 months b) absolute contraindication if given in combination with N-acetylcystein AND  prednisone c) contact PI if medicine needs to be given (Dr. Brand Males [phone] or [phone])   Bortezomib Other (See Comments)    a) absolute contraindication during study expected to last 6 months b) contact PI if medicine needs to be given (Dr. Brand Males [phone] or [phone])   Bupropion Other (See Comments)    a) absolute contraindication during study expected to last 6 months b) contact PI if medicine needs to be given (Dr. Brand Males [phone] or [phone])   Clomipramine Other (See Comments)    a) absolute contraindication during study expected to last 6 months b) contact PI if medicine needs to be given (Dr. Brand Males [phone] or [phone])   Clonidine Derivatives Other (See Comments)    a) absolute contraindication during study expected to last 6 months b) contact PI if medicine needs to be given (Dr. Brand Males [phone] or [phone])   Codeine Nausea And Vomiting   Dacomitinib Other (See Comments)    a) absolute contraindication during study expected to last 6 months b) contact PI if medicine needs to be given (Dr. Brand Males [phone] or [phone])   Desipramine Other (See Comments)    a) absolute contraindication during study expected to last 6 months b) contact PI if medicine needs to be given (Dr. Brand Males [phone] or [phone])   Dronedarone Other (See Comments)    a) absolute contraindication during study expected to last 6 months b) contact PI if medicine needs to be given (Dr. Brand Males [phone] or [phone])   Enasidenib Other (See Comments)    a) absolute contraindication during study expected to last 6 months b) contact PI if medicine needs to be given (Dr. Brand Males [phone] or [phone])   Erlotinib Other (See Comments)    a) absolute contraindication during study expected to last 6 months b) contact PI if medicine needs to be given (Dr. Brand Males [phone] or [phone])   Fluoxetine Other (See Comments)    a) absolute contraindication during study expected to last 6 months b) contact PI if medicine needs to be given (Dr. Brand Males [phone] or [phone])   Glycerol Phenylbutyrate Other (See Comments)    a) absolute contraindication during study expected to last 6 months b) contact PI if medicine needs to be given (Dr. Brand Males [phone] or [phone])   Idarubicin Other (See Comments)    a) absolute contraindication during study expected to last 6 months b) contact PI if medicine needs to be given (Dr. Brand Males [phone] or [phone])   Imipramine Other (See Comments)    a) absolute contraindication during study expected to last 6 months b) contact PI if medicine needs to be given (Dr. Brand Males [phone] or [phone])   Ixazomib Other (See Comments)  a) absolute contraindication during study expected to last 6 months b) contact PI if medicine needs to be given (Dr. Brand Males [phone] or [phone])   Midostaurin Other (See Comments)    a) absolute contraindication during study expected to last 6 months b) contact PI if medicine needs to be given (Dr. Brand Males [phone] or [phone])   N-Acetylcycteine [Acetylcysteine] Other (See Comments)    a) absolute contraindication during study expected to last 6 months b) absolute contraindication if given in combination with azathioprine AND prednisone c) contact PI if medicine needs to be given (Dr. Brand Males [phone] or [phone])   Nortriptyline Other (See Comments)    a) absolute contraindication during study expected to last 6 months b) contact PI if medicine needs to be given (Dr. Brand Males [phone] or [phone])   Orphenadrine Other (See Comments)    a) absolute contraindication during study expected to last 6 months b) contact PI if medicine needs to be  given (Dr. Brand Males [phone] or [phone])   Oxycontin [Oxycodone Hcl] Nausea Only and Other (See Comments)    FELT LIKE I WAS GOING TO FAINT   Paroxetine Other (See Comments)    a) absolute contraindication during study expected to last 6 months b) contact PI if medicine needs to be given (Dr. Brand Males [phone] or [phone])   Phenytoin Other (See Comments)    a) absolute contraindication during study expected to last 6 months b) contact PI if medicine needs to be given (Dr. Brand Males [phone] or [phone])   Pimozide Other (See Comments)    a) absolute contraindication during study expected to last 6 months b) contact PI if medicine needs to be given (Dr. Brand Males [phone] or [phone])   Ponatinib Other (See Comments)    a) absolute contraindication during study expected to last 6 months b) contact PI if medicine needs to be given (Dr. Brand Males [phone] or [phone])   Procainamide Other (See Comments)    a) absolute contraindication during study expected to last 6 months b) contact PI if medicine needs to be given (Dr. Brand Males [phone] or [phone])   Propafenone Other (See Comments)    a) absolute contraindication during study expected to last 6 months b) contact PI if medicine needs to be given (Dr. Brand Males [phone] or [phone])   Quinidine Other (See Comments)    a) absolute contraindication during study expected to last 6 months b) contact PI if medicine needs to be given (Dr. Brand Males [phone] or [phone])   Rucaparib Other (See Comments)    a) absolute contraindication during study expected to last 6 months b) contact PI if medicine needs to be given (Dr. Brand Males [phone] or [phone])   Sotalol Other (See Comments)    a) absolute contraindication during study expected to last 6 months b) contact PI if medicine needs to be given (Dr. Brand Males [phone] or [phone])   Statins Other (See Comments)    myalgias   Sulfa Antibiotics Nausea And Vomiting   Tamoxifen Other (See Comments)    a) absolute contraindication during study expected to last 6 months b) contact PI if medicine needs to be given (Dr. Brand Males [phone] or [phone])   Theophyllines Other (See Comments)    a) absolute contraindication during study expected to last 6 months b) contact PI if medicine needs to be given (Dr. Brand Males [phone] or [phone])   Thioridazine Other (See Comments)    a) absolute contraindication during  study expected to last 6 months b) contact PI if medicine needs to be given (Dr. Brand Males [phone] or [phone])   Tipranavir Other (See Comments)    a) absolute contraindication during study expected to last 6 months b) contact PI if medicine needs to be given (Dr. Brand Males [phone] or [phone])   Trabectedin Other (See Comments)    a) absolute contraindication during study expected to last 6 months b) contact PI if medicine needs to be given (Dr. Brand Males [phone] or [phone])   Trimipramine Other (See Comments)    a) absolute contraindication during study expected to last 6 months b) contact PI if medicine needs to be given (Dr. Brand Males [phone] or [phone])   Warfarin And Related Other (See Comments)    a) relative contraindication during study expected to last 6 months b) contact PI if medicine needs to be given (Dr. Brand Males [phone] or [phone]) c) patients with known indications for anticoag such as VTE or afib are excluded (contact PI)   Ibuprofen Other (See Comments)    Other reaction(s): GI Bleed     Review of Systems  Constitutional: Negative.   Respiratory: Negative.    Cardiovascular: Negative.   Neurological: Negative.   Psychiatric/Behavioral: Negative.       Today's Vitals   11/10/21 0836   BP: 106/78  Pulse: 96  Temp: 98.1 F (36.7 C)  SpO2: 98%  Weight: 130 lb 6.4 oz (59.1 kg)  Height: _0  (1.6 m)  PainSc: 0-No pain   Body mass index is 23.1 kg/m.  Wt Readings from Last 3 Encounters:  11/10/21 130 lb 6.4 oz (59.1 kg)  10/21/21 133 lb 12.8 oz (60.7 kg)  09/25/21 135 lb (61.2 kg)    Objective:  Physical Exam Vitals and nursing note reviewed.  Constitutional:      Appearance: Normal appearance.  HENT:     Head: Normocephalic and atraumatic.  Eyes:     Extraocular Movements: Extraocular movements intact.  Cardiovascular:     Rate and Rhythm: Normal rate and regular rhythm.     Heart sounds: Normal heart sounds.  Pulmonary:     Effort: Pulmonary effort is normal.     Breath sounds: Rales present.     Comments: Bibasilar crackles Musculoskeletal:     Cervical back: Normal range of motion.  Skin:    General: Skin is warm.  Neurological:     General: No focal deficit present.     Mental Status: She is alert.  Psychiatric:        Mood and Affect: Mood normal.        Behavior: Behavior normal.      Assessment And Plan:     1. Symptomatic menopausal or female climacteric states Comments: CBC and LFT results reviewed. Pt advised dose of Biest should be decreased since she is using vaginal estradiol. Unfortunately, she declines saliva testing. She does not wish to make any adjustments. For long term health, I do think it is important to decrease Biest dose (estradiol portion). It is important that she continue with progesterone nightly as well. She will f/u in 4-6 months for re-evaluation.   2. Aortic atherosclerosis (HCC) Comments: Goal LDL <70. She will c/w rosuvastatin 20m daily as per her PCP.   3. Need for postmenopausal hormone replacement - Estradiol - Progesterone   Patient was given opportunity to ask questions. Patient verbalized understanding of the plan and was able to repeat key elements of the plan. All questions  were answered to their  satisfaction.   I, Maximino Greenland, MD, have reviewed all documentation for this visit. The documentation on 11/10/21 for the exam, diagnosis, procedures, and orders are all accurate and complete.   IF YOU HAVE BEEN REFERRED TO A SPECIALIST, IT MAY TAKE 1-2 WEEKS TO SCHEDULE/PROCESS THE REFERRAL. IF YOU HAVE NOT HEARD FROM US/SPECIALIST IN TWO WEEKS, PLEASE GIVE [place] A CALL AT [phone] X [phone].   THE PATIENT IS ENCOURAGED TO PRACTICE SOCIAL DISTANCING DUE TO THE COVID-19 PANDEMIC.

## 2021-11-10 NOTE — Patient Instructions (Signed)
Health Maintenance for Postmenopausal Women Menopause is a normal process in which your ability to get pregnant comes to an end. This process happens slowly over many months or years, usually between the ages of 41 and 75. Menopause is complete when you have missed your menstrual period for 12 months. It is important to talk with your health care provider about some of the most common conditions that affect women after menopause (postmenopausal women). These include heart disease, cancer, and bone loss (osteoporosis). Adopting a healthy lifestyle and getting preventive care can help to promote your health and wellness. The actions you take can also lower your chances of developing some of these common conditions. What are the signs and symptoms of menopause? During menopause, you may have the following symptoms: Hot flashes. These can be moderate or severe. Night sweats. Decrease in sex drive. Mood swings. Headaches. Tiredness (fatigue). Irritability. Memory problems. Problems falling asleep or staying asleep. Talk with your health care provider about treatment options for your symptoms. Do I need hormone replacement therapy? Hormone replacement therapy is effective in treating symptoms that are caused by menopause, such as hot flashes and night sweats. Hormone replacement carries certain risks, especially as you become older. If you are thinking about using estrogen or estrogen with progestin, discuss the benefits and risks with your health care provider. How can I reduce my risk for heart disease and stroke? The risk of heart disease, heart attack, and stroke increases as you age. One of the causes may be a change in the body's hormones during menopause. This can affect how your body uses dietary fats, triglycerides, and cholesterol. Heart attack and stroke are medical emergencies. There are many things that you can do to help prevent heart disease and stroke. Watch your blood pressure High  blood pressure causes heart disease and increases the risk of stroke. This is more likely to develop in people who have high blood pressure readings or are overweight. Have your blood pressure checked: Every 3-5 years if you are 65-25 years of age. Every year if you are 70 years old or older. Eat a healthy diet  Eat a diet that includes plenty of vegetables, fruits, low-fat dairy products, and lean protein. Do not eat a lot of foods that are high in solid fats, added sugars, or sodium. Get regular exercise Get regular exercise. This is one of the most important things you can do for your health. Most adults should: Try to exercise for at least 150 minutes each week. The exercise should increase your heart rate and make you sweat (moderate-intensity exercise). Try to do strengthening exercises at least twice each week. Do these in addition to the moderate-intensity exercise. Spend less time sitting. Even light physical activity can be beneficial. Other tips Work with your health care provider to achieve or maintain a healthy weight. Do not use any products that contain nicotine or tobacco. These products include cigarettes, chewing tobacco, and vaping devices, such as e-cigarettes. If you need help quitting, ask your health care provider. Know your numbers. Ask your health care provider to check your cholesterol and your blood sugar (glucose). Continue to have your blood tested as directed by your health care provider. Do I need screening for cancer? Depending on your health history and family history, you may need to have cancer screenings at different stages of your life. This may include screening for: Breast cancer. Cervical cancer. Lung cancer. Colorectal cancer. What is my risk for osteoporosis? After menopause, you may be  at increased risk for osteoporosis. Osteoporosis is a condition in which bone destruction happens more quickly than new bone creation. To help prevent osteoporosis or  the bone fractures that can happen because of osteoporosis, you may take the following actions: If you are 108-33 years old, get at least 1,000 mg of calcium and at least 600 international units (IU) of vitamin D per day. If you are older than age 71 but younger than age 34, get at least 1,200 mg of calcium and at least 600 international units (IU) of vitamin D per day. If you are older than age 46, get at least 1,200 mg of calcium and at least 800 international units (IU) of vitamin D per day. Smoking and drinking excessive alcohol increase the risk of osteoporosis. Eat foods that are rich in calcium and vitamin D, and do weight-bearing exercises several times each week as directed by your health care provider. How does menopause affect my mental health? Depression may occur at any age, but it is more common as you become older. Common symptoms of depression include: Feeling depressed. Changes in sleep patterns. Changes in appetite or eating patterns. Feeling an overall lack of motivation or enjoyment of activities that you previously enjoyed. Frequent crying spells. Talk with your health care provider if you think that you are experiencing any of these symptoms. General instructions See your health care provider for regular wellness exams and vaccines. This may include: Scheduling regular health, dental, and eye exams. Getting and maintaining your vaccines. These include: Influenza vaccine. Get this vaccine each year before the flu season begins. Pneumonia vaccine. Shingles vaccine. Tetanus, diphtheria, and pertussis (Tdap) booster vaccine. Your health care provider may also recommend other immunizations. Tell your health care provider if you have ever been abused or do not feel safe at home. Summary Menopause is a normal process in which your ability to get pregnant comes to an end. This condition causes hot flashes, night sweats, decreased interest in sex, mood swings, headaches, or lack  of sleep. Treatment for this condition may include hormone replacement therapy. Take actions to keep yourself healthy, including exercising regularly, eating a healthy diet, watching your weight, and checking your blood pressure and blood sugar levels. Get screened for cancer and depression. Make sure that you are up to date with all your vaccines. This information is not intended to replace advice given to you by your health care provider. Make sure you discuss any questions you have with your health care provider. Document Revised: 07/21/2020 Document Reviewed: 07/21/2020 Elsevier Patient Education  [place].

## 2021-11-11 ENCOUNTER — Ambulatory Visit (HOSPITAL_BASED_OUTPATIENT_CLINIC_OR_DEPARTMENT_OTHER)
Admission: RE | Admit: 2021-11-11 | Discharge: 2021-11-11 | Disposition: A | Discharge status: Home / Self Care | Payer: Medicare Other | Source: Ambulatory Visit | Attending: Internal Medicine | Admitting: Internal Medicine

## 2021-11-11 ENCOUNTER — Encounter: Payer: Medicare Other | Admitting: *Deleted

## 2021-11-11 DIAGNOSIS — Z006 Encounter for examination for normal comparison and control in clinical research program: Secondary | ICD-10-CM | POA: Insufficient documentation

## 2021-11-11 DIAGNOSIS — J84112 Idiopathic pulmonary fibrosis: Secondary | ICD-10-CM

## 2021-11-12 ENCOUNTER — Other Ambulatory Visit (HOSPITAL_BASED_OUTPATIENT_CLINIC_OR_DEPARTMENT_OTHER): Payer: Medicare Other

## 2021-11-12 LAB — PROGESTERONE: Progesterone: 0.9 ng/mL

## 2021-11-12 LAB — ESTRADIOL: Estradiol: 87.8 pg/mL

## 2021-11-13 ENCOUNTER — Telehealth: Payer: Self-pay | Admitting: Internal Medicine

## 2021-11-13 MED ORDER — ONDANSETRON HCL 4 MG PO TABS: 4.0000 mg | ORAL_TABLET | Freq: Three times a day (TID) | ORAL | 0 refills | Status: DC | PRN

## 2021-11-13 NOTE — Telephone Encounter (Signed)
Subject notified coordinator of AE on 30Aug2023 with diarrhea.  Subject mentioned she drank a green drink that morning and was experiencing the same issues the day before when she drank the green drink . Subject instructed to stop drinking the green drink and monitor the diarrhea.   Subject notified coordinator 31/aug/2023 stating that she woke up with a headache and was nauseated after taking the IP. The nausea started about 3-4 hours after taking IP.  Subject ate breakfast 1 hour after IP and took her Protonix and esbriet. Subject only had 1 vomit episode on 31Aug2023 before lunch time. The rest of the evening felt good just had a headache.  Subject also states the diarrhea ended previous evening.  Subject instructed to monitor the times of nausea and if any further vomiting occurred over the evening.  Subject was notified on 01Sep2023 she said the night was fine no more nausea or vomiting just a headache around 1015 am Later subject notified coordinator 1100 stating she just vomited.  Again 3-4 hours after taking the IP.   Per Protocol 1.2.2.1 Nausea/Vomiting Grade 2 should be treated with ondansetron.  Subject traveling to [place] for the weekend. Subject gave [facility] on [facility] to call in medication. She is instructed to take mediation with IP first thing in the morning.  She is able to take it again up to 2 times each day 1-2 tablets each time. Also to hydrate with Coconut water, G2 Gatorade, or Pedialyte electrolyte replacement powder that is mixed with water. Also instructed to keep coordinator and PI updated throughout the weekend.

## 2021-11-13 NOTE — Telephone Encounter (Signed)
PI OVERSIGHT ATTESTATION  I the Principal Investigator (PI) for the above mentioned study attest that I reviewed the mentioned  clinical research coordinator  notes on research subject  Donna Holmes  born 21-Apr-1950 . I  agree with the findings mentioned above   CRC notified me and I d/w CRC and forumulated the action plan. Latest memor for sponsor is that if there is 5d of GI related AEs then study drug has to be discontinued   Dr.Azzure Garabedian Chase Caller, MD [facility]monary and [place] Staff Physician [facility] Pager: [phone], If no answer or between  15:00h - 7:00h: call [phone]  11/13/2021 10:58 PM

## 2021-11-18 ENCOUNTER — Telehealth: Payer: Self-pay | Admitting: Internal Medicine

## 2021-11-18 NOTE — Telephone Encounter (Signed)
TITLE: A Phase 2, Randomized, Double-Blind, Placebo-Controlled Study to Evaluate the Safety and Efficacy of [id number] in Patients With Idiopathic Pulmonary Fibrosis  Protocol #: [id number] NCT: [employer]  Protocol Version: version 2: approved [id number] IB:  version 8.0: approved 20July2022 ICF: Main: V2.0 approved 08Apr2022, modified [id number]  Pregnancy: V2.0 approved 25Jan2023  Study Design:  This is a randomized, double-blinded, placebo-controlled multicenter study to evaluate the safety and efficacy of [id number] in patients with IPF with or without standard-of-care. 2:1 randomization ratio to [id number] 150 mg BID or the matching placebo for  24 weeks.    Mechanism of Action Proline is one of the largest constituents of collagen. In IPF, there is excessive deposition of collagen. Prolyl-tRNA Synthetase (PRS) is an enzyme that conjugates proline. [id number] (Bersiporocin) is the world's first selective PRS inhibitor that decreases collagen formation and subsequent pro-fibrotic markers.  Administration  A dose of [id number] 150 mg BID will be administered orally in the fasted state or at least 2 hours after the last meal, for 24 weeks.   CRC called subject to check up on AE's.  Subject said they were starting to feel better until 05Sep2023.  Subject states increased fatigued, runny nose, tremors, chills, and poor appetite.  CRC asked subject when all theses symptoms started. Subject states taht the runny nose, chills, poor appetite, and fatigue have been going on since day 2 of IP just forgot to mention them. Subject states they were all getting better but on the 05Sep2023 they started getting worse again.   Subject states that after 2 days of taking the antinausea medication she developed tremors but just in the morning when she would wake up. Subject has been instructed to stop taking the antinausea medication and see if symptoms resolve.  Subject instructed to keep  hydrated with G2 Gatorade, coconut water, or Pedialyte electrolyte replacement.  Subject also instructed she is able to take ginger capsule from [employer] or fresh ginger to help also.   Subject states she was in bed all day. Subject just traveled to [place] , so she was instructed to take a covid test. She took and results were negative.   PI notified and all above instructions were provided from Dr. Chase Caller. CRC will keep in contact with Subject to monitor ongoing symptoms.    Signed by  Jaye Beagle RN MSN/MBA  Clinical Research Coordinator / Nurse [place], [place] 9:07 AM 11/18/2021

## 2021-11-19 NOTE — Telephone Encounter (Signed)
Spoke with subject today.  She is feeling worse and the nausea, chills, fatigue, headache, dry heaving is still ongoing. She feels her weight had dropped she states she weighed this morning her scale and was 126.2lb and she thinks she was 130 2 days ago.  At baseline visit on office scale she was 132.2lb. Subject starting taking protein shakes to help her appetite.   Since there is a decrease in caloric intake and possible weight loss this is now a Grade 3 AE per protocol   "6.3.2.2. Grade 3 Nausea and Vomiting For Grade 3 nausea (inadequate oral caloric or fluid intake; tube feedings, total parenteral nutrition [TPN], or hospitalization indicated), and Grade 3 vomiting (tube feeding, TPN, or hospitalization indicated), study treatment has to be discontinued until nausea or vomiting does not need treatment, and patients are treated with optimal supportive care (eg, ondansetrone or granisetron). Thereafter, patients may restart treatment with the study drug at a reduced dose."  Spoke with PI Dr. Chase Caller and decided, Therefore for subject to stop IP immediately and keep updated on when symptoms resolve.  At which this time she can start back on a reduced dose per protocol.  Holiday should not be more than 8 weeks.  Subject notified of instructions. She was also instructed to keep a weigh log to see if the weigh loss is ongoing. She verbalized understanding of instructions.   "6.3.5. Management of Adverse Events For all intolerable AEs, a dose reduction or an interruption (of maximum 8 weeks) has to be considered. Re-escalation is allowed in case of improved tolerance after start of dose reduction. Discussion with medical monitor and investigator or designee is recommended."

## 2021-11-19 NOTE — Research (Addendum)
TITLE: A Phase 2, Randomized, Double-Blind, Placebo-Controlled Study to Evaluate the Safety and Efficacy of [id number] in Patients With Idiopathic Pulmonary Fibrosis  Protocol #: [id number] NCT: [employer]  Protocol Version: version 2: approved [id number]  IB:  version 8.0: approved 20July2022  ICF: Main: V2.0 approved 08Apr2022, modified [id number] Pregnancy: V2.0 approved 25Jan2023   Study Design:  This is a randomized, double-blinded, placebo-controlled multicenter study to evaluate the safety and efficacy of [id number] in patients with IPF with or without standard-of-care. 2:1 randomization ratio to [id number] 150 mg BID or the matching placebo for  24 weeks.    Mechanism of Action Proline is one of the largest constituents of collagen. In IPF, there is excessive deposition of collagen. Prolyl-tRNA Synthetase (PRS) is an enzyme that conjugates proline. [id number] (Bersiporocin) is the world's first selective PRS inhibitor that decreases collagen formation and subsequent pro-fibrotic markers.  Administration  A dose of [id number] 150 mg BID will be administered orally in the fasted state or at least 2 hours after the last meal, for 24 weeks.   Key Inclusion Criteria age = 10 years Documented diagnosis of IPF per the 2018 ATS/ERS/JRS/ALAT Clinical Practice Meeting all of the following criteria during the screening period:             FVC ? 40% predicted            DLCOcor ?25% to ? 80%              (FEV1)/FVC ratio ? 0.7             Able to walk at least 150 m in 6MWT, resting SpO2 should be ? 88% with a maximum of 6L O2/min  On a stable dose of pirfenidone OR nintedanib for at least 3 months OR on neither pirfenidone nor nintedanib.  Key Exclusion Criteria Currently taking medication known as a strong CYP2D6 inhibitor OR taking medication known to be CYP2D6 inducers OR CYP2D6 substrate with narrow therapeutic index. GFR  < 30 mL/min/1.70m moderate to severe  hepatic impairment (Child-Pugh B and C). Patients with =3upper limit of normal of alanine aminotransferase, aspartate aminotransferase or gamma-glutamyl transpeptidase. Abnormal ECG findings including but not limited to QTc >500 ms.  Pharmacokinetics Urine PK data indicate that renal elimination is not the major clearance pathway for [id number]   Adverse effects and risk Overall, when [id number] administered concomitantly with Pirfenidone and Nintedanib, [id number] mg enteric-coated tablet was generally well tolerated and safe.   Safety data from edition number [id number] abstracted in March 2023.   Gastrointestinal adverse reactions (e.g Diarrhea, abdominal pain, nausea, vomiting) followed by CNS (headache and dizziness) as below were the most commonly observed adverse reactions.  Most adverse reactions were mild or moderate and reversible.  An improved enteric-coated 150 mg tablet (new) was reformulated to lower the initial dissolution rate in pH 6.8. Enteric-coated tablets (new) demonstrated that nausea and vomiting appear to have decreased. While the incidence rate of diarrhea was slightly increased than old formulation administration, the severity was all mild.  Below table comprises predominantly of enteric coated tablet.  Overall adverse event % n=229 Average of 4 different studies [id number]=24(part 1) Esbriet combination [id number]=24(part 2) Ofev combination   Nausea 54 out of 229 23.58% 6(25%) 2(8.33%)  Vomiting 37 out of 205 18.04% 1(4.17%) 1(4.17%)  Diarrhea 37 out of 229 16.15% 2(8.33%) 7(29.17%)  Abdominal pain 7 out of 229 3.05% 0 1(4.17%)  Constipation 4 out of 54  7.4% - -  Abdominal discomfort 4 out of 205  1.95% 0 1(4.17%)  Headache 25 out of 205  12.19% 4(16.67%) 1(4.17%)  Dizziness 8 out of 175 (4.57%) 3(12.50%) 1(4.17%)   Severity of TEAE Table  Overall adverse event % n=229 Average of 4 different studies [id number] n=24(part 1) Esbriet  combination [id number] n=24(part 2) Ofev combination   TEAE - occurrences  382 19 18  Severity- mild 351 (91.88%) 18 (94.73%) 17 (94.44%)   Moderate 31 (8.11%) 1 (5.26%) 1 (5.55%)  Severe 0 (0%) 0 (0%)  0 (0%)     91% TEAEs were mild  (96 of 106 TEAEs, including all 16 TEAEs in the placebo group). 9% (n=10) were moderate.   TEAEs occurred in a dose dependent manner with 7 of the 10 occurring in daily doses >= 654m  All moderate TEAEs were classed as Gastrointestinal Disorders (nausea, vomiting, diarrhea, and epigastric discomfort), were considered recovered/resolved within 24 hours, and had no sequelae. There were no severe, life-threatening or fatal TEAEs across the study. There were no subjects with serious TEAEs, and there were no subjects with TEAEs leading to IP discontinuation during Part 1 (SAD) of the study.  EKG concerns There were no effects on ECG with dose up to 80 mg/kg in cynomolgus monkeys. In humans no EKG abnormalities reported  except in Part 2 (([id number] concomitantly with Nintedanib), one case of PR prolongation LFT concerns In rats, reversible, minimal DWN12088HCl-related centrilobular hepatocyte hypertrophy was noted in the liver administered ?50 mg/kg/day and was considered consistent with DWN12088HCl-related induction of hepatocellular enzymes.  In humans, on investigation in the MAD study of six subjects, hepatic enzyme was increased in one participant (16.7%).   Rare concerns based on animal data - not seen in humans Excessive salivation in rats  at doses >50 mg/kg/day and mildly diminished appetite in 1 monkey was observed on one occasion.  With extremely high doses of 1200 mg/kg decreased activity, increased/labored/shallow respiration, gasping, vocalizing and piloerection was observed in female (but not female) rats 6h post dose.  [facility] @ CGastonCoordinator note :   This visit for Subject Donna Brizuelawith  DOB: [birth year]on 11/11/2021 for the above protocol is Visit/Encounter # Randomization and is for purpose of research.   Protocol Version: version 2: approved 31Mar22022  IB:  version 8.0: approved 20July2022  ICF: Main: V2.0 approved 08Apr2022, modified [id number]: V2.0 approved 25Jan2023   Subject expressed continued interest and consent in continuing as a study subject. Subject confirmed that there was  no change in contact information (e.g. address, telephone, email). Subject thanked for participation in research and contribution to science.  In this visit 11/11/2021 the subject will be evaluated by Principal Investigator named Dr. MBrand Males This research coordinator has verified that the above investigator is up to date with his/her training logs.   The Subject was informed that the PI Dr. RChase Callercontinues to have oversight of the subject's visits and course  through relevant discussions, reviews and also specifically of this visit by routing of this note to the [facility]  This visit is a key visit of Randomization. The PI is available for this visit.     Subject arrived for visit and completed all assessments designated for this visit per protocol.  Subject also had HRCT for this visit. Subject did not have an new AE's and added new medications to her medlist. Subject received IP, ID card, diary and instructions. Visit week 4 was made and confirmed with subject.  Signed by  Jaye Beagle RN MSN/MBA  Clinical Research Coordinator / Nurse [facility]  [place], [place] 3:22 PM 11/19/2021

## 2021-11-21 ENCOUNTER — Encounter: Payer: Self-pay | Admitting: Pharmacist

## 2021-11-22 NOTE — Telephone Encounter (Signed)
PI OVERSIGHT ATTESTATION  I the Principal Investigator (PI) for the above mentioned study attest that I reviewed the mentioned  clinical research coordinator  notes on research subject  Donna Holmes  born [birth year] . I  agree with the findings mentioned above  Comment: will have her hold IP till we see her for scheduled research visit or through an unscheduled research visit next 5-10 biz days before taking a decision   Dr.Raeden Belzer Chase Caller, MD Pulmonary and [place] Staff Physician [facility] Pager: [phone], If no answer or between  15:00h - 7:00h: call [phone]  11/22/2021 5:14 PM

## 2021-11-24 ENCOUNTER — Ambulatory Visit: Payer: Medicare Other | Admitting: Internal Medicine

## 2021-11-24 VITALS — BP 112/60 | HR 85 | Temp 97.8°F | Ht 63.0 in | Wt 128.0 lb

## 2021-11-24 DIAGNOSIS — R739 Hyperglycemia, unspecified: Secondary | ICD-10-CM | POA: Diagnosis not present

## 2021-11-24 DIAGNOSIS — R1010 Upper abdominal pain, unspecified: Secondary | ICD-10-CM

## 2021-11-24 LAB — CBC WITH DIFFERENTIAL/PLATELET
Basophils Absolute: 0.1 10*3/uL (ref 0.0–0.1)
Basophils Relative: 1.3 % (ref 0.0–3.0)
Eosinophils Absolute: 0.1 10*3/uL (ref 0.0–0.7)
Eosinophils Relative: 2.2 % (ref 0.0–5.0)
HCT: 35.3 % — ABNORMAL LOW (ref 36.0–46.0)
Hemoglobin: 11.7 g/dL — ABNORMAL LOW (ref 12.0–15.0)
Lymphocytes Relative: 44 % (ref 12.0–46.0)
Lymphs Abs: 3 10*3/uL (ref 0.7–4.0)
MCHC: 33.2 g/dL (ref 30.0–36.0)
MCV: 87.7 fl (ref 78.0–100.0)
Monocytes Absolute: 0.7 10*3/uL (ref 0.1–1.0)
Monocytes Relative: 10.2 % (ref 3.0–12.0)
Neutro Abs: 2.9 10*3/uL (ref 1.4–7.7)
Neutrophils Relative %: 42.3 % — ABNORMAL LOW (ref 43.0–77.0)
Platelets: 290 10*3/uL (ref 150.0–400.0)
RBC: 4.02 Mil/uL (ref 3.87–5.11)
RDW: 14 % (ref 11.5–15.5)
WBC: 6.8 10*3/uL (ref 4.0–10.5)

## 2021-11-24 LAB — BASIC METABOLIC PANEL
BUN: 21 mg/dL (ref 6–23)
CO2: 29 mEq/L (ref 19–32)
Calcium: 9.4 mg/dL (ref 8.4–10.5)
Chloride: 103 mEq/L (ref 96–112)
Creatinine, Ser: 0.71 mg/dL (ref 0.40–1.20)
GFR: 84.21 mL/min (ref >60.00)
Glucose, Bld: 84 mg/dL (ref 70–99)
Potassium: 4.4 mEq/L (ref 3.5–5.1)
Sodium: 137 mEq/L (ref 135–145)

## 2021-11-24 LAB — HEPATIC FUNCTION PANEL
ALT: 16 U/L (ref 0–35)
AST: 17 U/L (ref 0–37)
Albumin: 4.1 g/dL (ref 3.5–5.2)
Alkaline Phosphatase: 45 U/L (ref 39–117)
Bilirubin, Direct: 0.1 mg/dL (ref 0.0–0.3)
Total Bilirubin: 0.3 mg/dL (ref 0.2–1.2)
Total Protein: 7 g/dL (ref 6.0–8.3)

## 2021-11-24 LAB — LIPASE: Lipase: 58 U/L (ref 11.0–59.0)

## 2021-11-24 NOTE — Progress Notes (Signed)
Patient ID: Donna Holmes, female   DOB: 05/23/1950, 71 y.o.   MRN: [id number]        Chief Complaint: follow up recent GI upset       HPI:  Donna Holmes is a 71 y.o. female here with c/o GI upset n/v/pain, tremors, chills, unsteadiness, sleepy, less appetite with taking a study drug [id number].  Has stopped the med, still feels fatigued, trying to drink more fluids, and asking for f/u labs today.  Pt denies chest pain, increased sob or doe, wheezing, orthopnea, PND, increased LE swelling, palpitations, dizziness or syncope.   Pt denies polydipsia, polyuria, or new focal neuro s/s.     Home tested covid neg yesterday.   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   Complication of anesthesia    GERD (gastroesophageal reflux disease)    Headache(784.0) 05/19/2009   Past Surgical History:  Procedure Laterality Date   ANTERIOR AND POSTERIOR REPAIR WITH SACROSPINOUS FIXATION N/A 05/16/2014   Procedure: ANTERIOR AND POSTERIOR REPAIR WITH SACROSPINOUS LIGAMENT SUSPENSION;  Surgeon: Luz Lex, MD;  Location: [facility];  Service: Gynecology;  Laterality: N/A;   BARTHOLIN CYST MARSUPIALIZATION     BUNIONECTOMY Right    LAPAROSCOPIC ASSISTED VAGINAL HYSTERECTOMY N/A 05/16/2014   Procedure: LAPAROSCOPIC ASSISTED VAGINAL HYSTERECTOMY;  Surgeon: Luz Lex, MD;  Location: [facility];  Service: Gynecology;  Laterality: N/A;   NEUROMA SURGERY Right    2nd interspace    reports that she has never smoked. She has never used smokeless tobacco. She reports that she does not drink alcohol and does not use drugs. family history includes Alcohol abuse in her  father; Hypertension in her mother; Prostate cancer in her maternal uncle. Allergies  Allergen Reactions   Aspirin    Codeine Nausea And Vomiting   Oxycontin [Oxycodone Hcl] Nausea Only and Other (See Comments)    FELT LIKE I WAS GOING TO FAINT   Ponatinib Other (See Comments)    a) absolute contraindication during study expected to last 6 months b) contact PI if medicine needs to be given (Dr. Brand Males [phone] or [phone])   Procainamide Other (See Comments)    a) absolute contraindication during study expected to last 6 months b) contact PI if medicine needs to be given (Dr. Brand Males [phone] or [phone])   Propafenone Other (See Comments)    a) absolute contraindication during study expected to last 6 months b) contact PI if medicine needs to be given (Dr. Brand Males [phone] or [phone])   Quinidine Other (See Comments)    a) absolute contraindication during study expected to last 6 months b) contact PI if medicine needs to be given (Dr. Brand Males [phone] or [phone])   Ritonavir Other (See Comments)    a) absolute contraindication during study expected to last 6 months b) contact PI if medicine needs to be given (Dr. Brand Males [phone] or [phone])   Rucaparib Other (See Comments)    a) absolute contraindication during study expected to last 6 months b) contact PI if medicine needs to be  given (Dr. Brand Males [phone] or [phone])   Sertraline Other (See Comments)    a) absolute contraindication during study expected to last 6 months b) contact PI if medicine needs to be given (Dr. Brand Males [phone] or [phone])   Sotalol Other (See Comments)    a) absolute contraindication during study expected to last 6 months b) contact PI if medicine needs to be given (Dr. Brand Males [phone] or [phone])   Statins Other (See Comments)    myalgias   Sulfa Antibiotics  Nausea And Vomiting   Tamoxifen Other (See Comments)    a) absolute contraindication during study expected to last 6 months b) contact PI if medicine needs to be given (Dr. Brand Males [phone] or [phone])   Terbinafine And Related Other (See Comments)    a) absolute contraindication during study expected to last 6 months b) contact PI if medicine needs to be given (Dr. Brand Males [phone] or [phone])   Theophyllines Other (See Comments)    a) absolute contraindication during study expected to last 6 months b) contact PI if medicine needs to be given (Dr. Brand Males [phone] or [phone])   Thioridazine Other (See Comments)    a) absolute contraindication during study expected to last 6 months b) contact PI if medicine needs to be given (Dr. Brand Males [phone] or [phone])   Tipranavir Other (See Comments)    a) absolute contraindication during study expected to last 6 months b) contact PI if medicine needs to be given (Dr. Brand Males [phone] or [phone])   Trabectedin Other (See Comments)    a) absolute contraindication during study expected to last 6 months b) contact PI if medicine needs to be given (Dr. Brand Males [phone] or [phone])   Trimipramine Other (See Comments)    a) absolute contraindication during study expected to last 6 months b) contact PI if medicine needs to be given (Dr. Brand Males [phone] or [phone])   Vemurafenib Other (See Comments)    a) absolute contraindication during study expected to last 6 months b) contact PI if medicine needs to be given (Dr. Brand Males [phone] or [phone])   Ibuprofen Other (See Comments)    Other reaction(s): GI Bleed   Current Outpatient Medications on File Prior to Visit  Medication Sig Dispense Refill   acetaminophen (TYLENOL) 500 MG tablet Take 500 mg by mouth every 6 (six) hours as needed.     albuterol (VENTOLIN  HFA) 108 (90 Base) MCG/ACT inhaler TAKE 2 PUFFS BY MOUTH EVERY 6 HOURS AS NEEDED FOR WHEEZE OR SHORTNESS OF BREATH 8.5 each 5   Ascorbic Acid (VITAMIN C) 250 MG CHEW Chew 250 mg by mouth daily.     calcium carbonate (OS-CAL) 600 MG TABS tablet Take 600 mg by mouth daily.     cetirizine-pseudoephedrine (ZYRTEC-D) 5-120 MG per tablet Take 1 tablet by mouth once as needed for allergies.     Cholecalciferol (VITAMIN D-3 PO) Take 4,000 Units by mouth daily.     CRANBERRY PO Take by mouth.     estradiol (ESTRACE) 0.1 MG/GM vaginal cream Place 1 Applicatorful vaginally 2 (two) times a week.     Estradiol-Estriol-Progesterone (BIEST/PROGESTERONE) CREA Apply 2 clicks daily, except on Sunday. 40 g PRN   Glucosamine-Chondroitin (GLUCOSAMINE CHONDR COMPLEX PO) Take 1 tablet by mouth daily.     Omega-3 Fatty Acids (FISH OIL) 1000 MG CAPS Take 3 capsules by mouth daily.      pantoprazole (PROTONIX) 40 MG tablet TAKE 1 TABLET  BY MOUTH EVERY DAY 90 tablet 2   Pirfenidone (ESBRIET) 801 MG TABS Take 1 tablet (801 mg) by mouth with breakfast, with lunch, and with evening meal. 270 tablet 1   RESTASIS 0.05 % ophthalmic emulsion 1 drop 2 (two) times daily.     rosuvastatin (CRESTOR) 10 MG tablet Take 1 tablet (10 mg total) by mouth daily. 90 tablet 3   Vitamin A 2400 MCG (8000 UT) CAPS Take 2,400 mcg by mouth daily.     Vitamin E 180 MG (400 UNIT) CAPS Take 180 mg by mouth daily.     No current facility-administered medications on file prior to visit.        ROS:  All others reviewed and negative.  Objective        PE:  BP 112/60 (BP Location: Right Arm, Patient Position: Sitting, Cuff Size: Large)   Pulse 85   Temp 97.8 F (36.6 C) (Oral)   Ht 5' 3" (1.6 m)   Wt 128 lb (58.1 kg)   SpO2 96%   BMI 22.67 kg/m                 Constitutional: Pt appears in NAD               HENT: Head: NCAT.                Right Ear: External ear normal.                 Left Ear: External ear normal.                Eyes:  . Pupils are equal, round, and reactive to light. Conjunctivae and EOM are normal               Nose: without d/c or deformity               Neck: Neck supple. Gross normal ROM               Cardiovascular: Normal rate and regular rhythm.                 Pulmonary/Chest: Effort normal and breath sounds without rales or wheezing.                Abd:  Soft, NT, ND, + BS, no organomegaly               Neurological: Pt is alert. At baseline orientation, motor grossly intact               Skin: Skin is warm. No rashes, no other new lesions, LE edema - none               Psychiatric: Pt behavior is normal without agitation   Micro: none  Cardiac tracings I have personally interpreted today:  none  Pertinent Radiological findings (summarize): none   Lab Results  Component Value Date   WBC 6.8 11/24/2021   HGB 11.7 (L) 11/24/2021   HCT 35.3 (L) 11/24/2021   PLT 290.0 11/24/2021   GLUCOSE 84 11/24/2021   CHOL 274 (H) 04/22/2021   TRIG 288.0 (H) 04/22/2021   HDL 45.40 04/22/2021   LDLDIRECT 194.0 04/22/2021   LDLCALC 124 (H) 08/01/2018   ALT 16 11/24/2021   AST 17 11/24/2021   NA 137 11/24/2021   K 4.4 11/24/2021   CL 103 11/24/2021   CREATININE 0.71 11/24/2021   BUN 21 11/24/2021   CO2 29 11/24/2021  TSH 1.42 04/22/2021   HGBA1C 6.0 04/22/2021   Assessment/Plan:  Donna Holmes is a 71 y.o. 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  Upper abdominal pain With GI upset, for labs as per pt request including cbc, BMP,  to f/u any worsening symptoms or concerns  Hyperglycemia Lab Results  Component Value Date   HGBA1C 6.0 04/22/2021   Stable, pt to continue current medical treatment  - diet, wt control, excercise  Followup: Return  if symptoms worsen or fail to improve.  Cathlean Cower, MD 11/26/2021 12:22 PM [facility]

## 2021-11-24 NOTE — Patient Instructions (Signed)
Please continue all other medications as before  Please have the pharmacy call with any other refills you may need.  Please continue your efforts at being more active, low cholesterol diet, and weight control.  Please keep your appointments with your specialists as you may have planned  Please go to the LAB at the blood drawing area for the tests to be done  You will be contacted by phone if any changes need to be made immediately.  Otherwise, you will receive a letter about your results with an explanation, but please check with MyChart first.  Please remember to sign up for MyChart if you have not done so, as this will be important to you in the future with finding out test results, communicating by private email, and scheduling acute appointments online when needed.

## 2021-11-25 ENCOUNTER — Telehealth: Payer: Self-pay | Admitting: Internal Medicine

## 2021-11-25 DIAGNOSIS — R197 Diarrhea, unspecified: Secondary | ICD-10-CM

## 2021-11-25 LAB — URINALYSIS, ROUTINE W REFLEX MICROSCOPIC
Bilirubin Urine: NEGATIVE
Hgb urine dipstick: NEGATIVE
Ketones, ur: NEGATIVE
Leukocytes,Ua: NEGATIVE
Nitrite: NEGATIVE
RBC / HPF: NONE SEEN (ref 0–?)
Specific Gravity, Urine: 1.02 (ref 1.000–1.030)
Total Protein, Urine: NEGATIVE
Urine Glucose: NEGATIVE
Urobilinogen, UA: 0.2 (ref 0.0–1.0)
pH: 6 (ref 5.0–8.0)

## 2021-11-26 ENCOUNTER — Encounter: Payer: Self-pay | Admitting: Internal Medicine

## 2021-11-26 NOTE — Assessment & Plan Note (Signed)
With GI upset, for labs as per pt request including cbc, BMP,  to f/u any worsening symptoms or concerns

## 2021-11-26 NOTE — Assessment & Plan Note (Signed)
Lab Results  Component Value Date   HGBA1C 6.0 04/22/2021   Stable, pt to continue current medical treatment  - diet, wt control, excercise

## 2021-11-30 ENCOUNTER — Other Ambulatory Visit (HOSPITAL_BASED_OUTPATIENT_CLINIC_OR_DEPARTMENT_OTHER): Payer: Medicare Other

## 2021-12-01 NOTE — Telephone Encounter (Signed)
Most recent standard of care labs were done on 11/24/2021 after onset of the AE post randomization.  These labs are listed below.  They are normal.  Therefore we will not do an unscheduled visit but just see her routine recent visit later in the month   SIGNATURE    Dr. Brand Males, M.D., F.C.C.P, ACRP-CPI Pulmonary and Critical Care Medicine Research Investigator, [facility] [place] Staff Physician, [place] Director - Interstitial Lung Disease  Program  Pulmonary [facility] [place], [place], [address]   Pager: [phone], If no answer  OR between  19:00-7:00h: page [phone] Telephone (research): [phone]  5:54 PM 12/01/2021   5:54 PM 12/01/2021    Latest Reference Range & Units 11/24/21 93:81  BASIC METABOLIC PANEL  Rpt  Sodium 135 - 145 mEq/L 137  Potassium 3.5 - 5.1 mEq/L 4.4  Chloride 96 - 112 mEq/L 103  CO2 19 - 32 mEq/L 29  Glucose 70 - 99 mg/dL 84  BUN 6 - 23 mg/dL 21  Creatinine 0.40 - 1.20 mg/dL 0.71  Calcium 8.4 - 10.5 mg/dL 9.4  Alkaline Phosphatase 39 - 117 U/L 45  Albumin 3.5 - 5.2 g/dL 4.1  Lipase 11.0 - 59.0 U/L 58.0  AST 0 - 37 U/L 17  ALT 0 - 35 U/L 16  Total Protein 6.0 - 8.3 g/dL 7.0  Bilirubin, Direct 0.0 - 0.3 mg/dL 0.1  Total Bilirubin 0.2 - 1.2 mg/dL 0.3  GFR >60.00 mL/min 84.21  WBC 4.0 - 10.5 K/uL 6.8  RBC 3.87 - 5.11 Mil/uL 4.02  Hemoglobin 12.0 - 15.0 g/dL 11.7 (L)  HCT 36.0 - 46.0 % 35.3 (L)  MCV 78.0 - 100.0 fl 87.7  MCHC 30.0 - 36.0 g/dL 33.2  RDW 11.5 - 15.5 % 14.0  Platelets 150.0 - 400.0 K/uL 290.0  Neutrophils 43.0 - 77.0 % 42.3 (L)  Lymphocytes 12.0 - 46.0 % 44.0  Monocytes Relative 3.0 - 12.0 % 10.2  Eosinophil 0.0 - 5.0 % 2.2  Basophil 0.0 - 3.0 % 1.3  NEUT# 1.4 - 7.7 K/uL 2.9  Lymphocyte # 0.7 - 4.0 K/uL 3.0  Monocyte # 0.1 - 1.0 K/uL 0.7  Eosinophils Absolute 0.0 - 0.7 K/uL 0.1  Basophils Absolute 0.0 - 0.1  K/uL 0.1  URINALYSIS, ROUTINE W REFLEX MICROSCOPIC  Rpt !  Appearance Clear;Turbid;Slightly Cloudy;Cloudy  Cloudy !  Bilirubin Urine Negative  NEGATIVE  Color, Urine Yellow;Lt. Yellow;Straw;Dark Yellow;Amber;Green;Red;Brown  YELLOW  Hgb urine dipstick Negative  NEGATIVE  Ketones, ur Negative  NEGATIVE  Leukocytes,Ua Negative  NEGATIVE  Nitrite Negative  NEGATIVE  pH 5.0 - 8.0  6.0  Specific Gravity, Urine 1.000 - 1.030  1.020  Urine Glucose Negative  NEGATIVE  Urobilinogen, UA 0.0 - 1.0  0.2  Ca Oxalate Crys, UA None  Presence of !  RBC / HPF 0-2/hpf  none seen  Squamous Epithelial / LPF Rare(0-4/hpf)  Rare(0-4/hpf)  WBC, UA 0-2/hpf  0-2/hpf  Total Protein, Urine-UPE24 Negative  NEGATIVE  Amorphous None;Present  Present !  (L): Data is abnormally low !: Data is abnormal Rpt: View report in Results Review for more information

## 2021-12-01 NOTE — Telephone Encounter (Signed)
Called spoke with subject on 11/25/2021 states that her weight has been stable, she still has no appetite, nauseated, tremors, and headache.  Subject stopped IP on 11/22/2021.   Since she just saw her PCP and had labs drawn will discuss with PI about the unscheduled visit.   Next subject visit in 12/11/2021

## 2021-12-11 ENCOUNTER — Encounter: Payer: Self-pay | Admitting: *Deleted

## 2021-12-11 DIAGNOSIS — J84112 Idiopathic pulmonary fibrosis: Secondary | ICD-10-CM

## 2021-12-11 DIAGNOSIS — Z006 Encounter for examination for normal comparison and control in clinical research program: Secondary | ICD-10-CM

## 2021-12-14 NOTE — Research (Addendum)
TITLE: A Phase 2, Randomized, Double-Blind, Placebo-Controlled Study to Evaluate the Safety and Efficacy of [id number] in Patients With Idiopathic Pulmonary Fibrosis  Protocol #: [id number] NCT: [facility]  Protocol Version: version 2: approved [id number] IB:  version 8.0: approved 20July2022 ICF: Main: V2.0 approved 08Apr2022, modified [id number]  Pregnancy: V2.0 approved 25Jan2023  Study Design:  This is a randomized, double-blinded, placebo-controlled multicenter study to evaluate the safety and efficacy of [id number] in patients with IPF with or without standard-of-care. 2:1 randomization ratio to [id number] 150 mg BID or the matching placebo for  24 weeks.    Mechanism of Action Proline is one of the largest constituents of collagen. In IPF, there is excessive deposition of collagen. Prolyl-tRNA Synthetase (PRS) is an enzyme that conjugates proline. [id number] (Bersiporocin) is the world's first selective PRS inhibitor that decreases collagen formation and subsequent pro-fibrotic markers.  Administration  A dose of [id number] 150 mg BID will be administered orally in the fasted state or at least 2 hours after the last meal, for 24 weeks.   Key Inclusion Criteria age = 88 years Documented diagnosis of IPF per the 2018 ATS/ERS/JRS/ALAT Clinical Practice Meeting all of the following criteria during the screening period:             FVC ? 40% predicted            DLCOcor ?25% to ? 80%              (FEV1)/FVC ratio ? 0.7             Able to walk at least 150 m in 6MWT, resting SpO2 should be ? 88% with a maximum of 6L O2/min  On a stable dose of pirfenidone OR nintedanib for at least 3 months OR on neither pirfenidone nor nintedanib.  Key Exclusion Criteria Currently taking medication known as a strong CYP2D6 inhibitor OR taking medication known to be CYP2D6 inducers OR CYP2D6 substrate with narrow therapeutic index. GFR  < 30 mL/min/1.52m moderate to severe  hepatic impairment (Child-Pugh B and C). Patients with =3upper limit of normal of alanine aminotransferase, aspartate aminotransferase or gamma-glutamyl transpeptidase. Abnormal ECG findings including but not limited to QTc >500 ms.  Pharmacokinetics Urine PK data indicate that renal elimination is not the major clearance pathway for [id number]   Adverse effects and risk Overall, when [id number] administered concomitantly with Pirfenidone and Nintedanib, [id number] mg enteric-coated tablet was generally well tolerated and safe.   Safety data from edition number [id number] abstracted in March 2023.   Gastrointestinal adverse reactions (e.g Diarrhea, abdominal pain, nausea, vomiting) followed by CNS (headache and dizziness) as below were the most commonly observed adverse reactions.  Most adverse reactions were mild or moderate and reversible.  An improved enteric-coated 150 mg tablet (new) was reformulated to lower the initial dissolution rate in pH 6.8. Enteric-coated tablets (new) demonstrated that nausea and vomiting appear to have decreased. While the incidence rate of diarrhea was slightly increased than old formulation administration, the severity was all mild.  Below table comprises predominantly of enteric coated tablet.  Overall adverse event % n=229 Average of 4 different studies [id number]=24(part 1) Esbriet combination [id number]=24(part 2) Ofev combination   Nausea 54 out of 229 23.58% 6(25%) 2(8.33%)  Vomiting 37 out of 205 18.04% 1(4.17%) 1(4.17%)  Diarrhea 37 out of 229 16.15% 2(8.33%) 7(29.17%)  Abdominal pain 7 out of 229 3.05% 0 1(4.17%)  Constipation 4 out of 54  7.4% - -  Abdominal discomfort  4 out of 205  1.95% 0 1(4.17%)  Headache 25 out of 205  12.19% 4(16.67%) 1(4.17%)  Dizziness 8 out of 175 (4.57%) 3(12.50%) 1(4.17%)   Severity of TEAE Table  Overall adverse event % n=229 Average of 4 different studies [id number] n=24(part 1) Esbriet  combination [id number] n=24(part 2) Ofev combination   TEAE - occurrences  382 19 18  Severity- mild 351 (91.88%) 18 (94.73%) 17 (94.44%)   Moderate 31 (8.11%) 1 (5.26%) 1 (5.55%)  Severe 0 (0%) 0 (0%)  0 (0%)     91% TEAEs were mild  (96 of 106 TEAEs, including all 16 TEAEs in the placebo group). 9% (n=10) were moderate.   TEAEs occurred in a dose dependent manner with 7 of the 10 occurring in daily doses >= 665m  All moderate TEAEs were classed as Gastrointestinal Disorders (nausea, vomiting, diarrhea, and epigastric discomfort), were considered recovered/resolved within 24 hours, and had no sequelae. There were no severe, life-threatening or fatal TEAEs across the study. There were no subjects with serious TEAEs, and there were no subjects with TEAEs leading to IP discontinuation during Part 1 (SAD) of the study.  EKG concerns There were no effects on ECG with dose up to 80 mg/kg in cynomolgus monkeys. In humans no EKG abnormalities reported  except in Part 2 (([id number] concomitantly with Nintedanib), one case of PR prolongation LFT concerns In rats, reversible, minimal DWN12088HCl-related centrilobular hepatocyte hypertrophy was noted in the liver administered ?50 mg/kg/day and was considered consistent with DWN12088HCl-related induction of hepatocellular enzymes.  In humans, on investigation in the MAD study of six subjects, hepatic enzyme was increased in one participant (16.7%).   Rare concerns based on animal data - not seen in humans Excessive salivation in rats  at doses >50 mg/kg/day and mildly diminished appetite in 1 monkey was observed on one occasion.  With extremely high doses of 1200 mg/kg decreased activity, increased/labored/shallow respiration, gasping, vocalizing and piloerection was observed in female (but not female) rats 6h post dose.    [facility] note :   This visit for Subject Donna Geoffroywith  DOB: [birth year]on 29Sep2025 for the above protocol is Visit/Encounter # End of Treatment and is for purpose of research.   Protocol Version: version 2: approved 31Mar22022 IB:  version 8.0: approved 20July2022 ICF: Main: V2.0 approved 08Apr2022, modified [id number] Pregnancy: V2.0 approved 25Jan2023  Subject expressed continued interest and consent in continuing as a study subject and not continue receiving the Study Drug. Subject confirmed that there was no change in contact information (e.g. address, telephone, email). Subject thanked for participation in research and contribution to science.  In this visit 29Sep2023 the subject will be evaluated by Sub-Investigator Dr. WUnice Cobble This research coordinator has verified that the above investigator is up to date with his/her training logs.   The Subject was informed that the PI Dr. RChase Callercontinues to have oversight of the subject's visits and course  through relevant discussions, reviews and also specifically of this visit by routing of this note to the [place]  Because the PI is NOT available, the sub-I reported and CRC has confirmed that the PI has discussed the visit a-prior with the sub-investigator.  Subject came to visit and was discussing her fear in continuing the IP.  The Sub-I sat with subject and discussed all options for subject. At the time of the visit subject wants to stop IP and continue with study assessments.  This prompt  the team to run a EOT visit instead of week 4 for the subject. All assessments were completed for this study per the protocol. AE's were reviewed and updated. No NEW medication or AE to add to subjects binder.  At end of visit subject felt better about her decision. She states she would like to go home and think about if she wants to continue with assessments and continue with the study.      Signed by  Jaye Beagle RN, MSN/MBA  Clinical Research Coordinator / Nurse [facility]  [place], [place] 1:00 PM  12/14/2021

## 2021-12-16 NOTE — Progress Notes (Signed)
Donna Holmes ,DOB [birth year], was seen as subject in a clinical trial /Protocol #: [id number] Cardiopulmonary symptoms are stable Other or new symptoms include nausea/vomiting, abdominal pain, generalized tremors, chills, unsteadiness, lethargy, and anorexia which she attributed to the study drug.  She made the statement that she and her husband referred to the agent as "poison."  She was seen by her PCP for the symptoms 11/24/2021.  Comprehensive metabolic profile was normal on that date.  CBC revealed a normochromic, normocytic anemia with H/H of 11.7/35.3. Pertinent physical findings include: Rales lower one half of the posterior thorax bilaterally.  Abdominal exam unremarkable. All physical findings NCS                                                                     Hendricks Limes MD,SI

## 2021-12-17 ENCOUNTER — Other Ambulatory Visit (HOSPITAL_COMMUNITY): Payer: Self-pay

## 2021-12-18 ENCOUNTER — Other Ambulatory Visit (INDEPENDENT_AMBULATORY_CARE_PROVIDER_SITE_OTHER): Payer: Medicare Other

## 2021-12-18 ENCOUNTER — Telehealth: Payer: Self-pay | Admitting: Internal Medicine

## 2021-12-18 DIAGNOSIS — R197 Diarrhea, unspecified: Secondary | ICD-10-CM | POA: Diagnosis not present

## 2021-12-18 DIAGNOSIS — Z5181 Encounter for therapeutic drug level monitoring: Secondary | ICD-10-CM

## 2021-12-18 LAB — HEPATIC FUNCTION PANEL
ALT: 12 U/L (ref 0–35)
AST: 13 U/L (ref 0–37)
Albumin: 4.5 g/dL (ref 3.5–5.2)
Alkaline Phosphatase: 55 U/L (ref 39–117)
Bilirubin, Direct: 0.1 mg/dL (ref 0.0–0.3)
Total Bilirubin: 0.3 mg/dL (ref 0.2–1.2)
Total Protein: 7.2 g/dL (ref 6.0–8.3)

## 2021-12-18 LAB — URINALYSIS, ROUTINE W REFLEX MICROSCOPIC
Bilirubin Urine: NEGATIVE
Hgb urine dipstick: NEGATIVE
Leukocytes,Ua: NEGATIVE
Nitrite: NEGATIVE
Specific Gravity, Urine: 1.02 (ref 1.000–1.030)
Total Protein, Urine: NEGATIVE
Urine Glucose: NEGATIVE
Urobilinogen, UA: 0.2 (ref 0.0–1.0)
pH: 6 (ref 5.0–8.0)

## 2021-12-18 LAB — BASIC METABOLIC PANEL
BUN: 22 mg/dL (ref 6–23)
CO2: 30 mEq/L (ref 19–32)
Calcium: 9.4 mg/dL (ref 8.4–10.5)
Chloride: 100 mEq/L (ref 96–112)
Creatinine, Ser: 0.72 mg/dL (ref 0.40–1.20)
GFR: 82.77 mL/min (ref >60.00)
Glucose, Bld: 129 mg/dL — ABNORMAL HIGH (ref 70–99)
Potassium: 3.7 mEq/L (ref 3.5–5.1)
Sodium: 135 mEq/L (ref 135–145)

## 2021-12-18 LAB — CBC WITH DIFFERENTIAL/PLATELET
Basophils Absolute: 0.1 10*3/uL (ref 0.0–0.1)
Basophils Relative: 1.3 % (ref 0.0–3.0)
Eosinophils Absolute: 0.1 10*3/uL (ref 0.0–0.7)
Eosinophils Relative: 1.7 % (ref 0.0–5.0)
HCT: 36.3 % (ref 36.0–46.0)
Hemoglobin: 12 g/dL (ref 12.0–15.0)
Lymphocytes Relative: 41.3 % (ref 12.0–46.0)
Lymphs Abs: 3 10*3/uL (ref 0.7–4.0)
MCHC: 33.1 g/dL (ref 30.0–36.0)
MCV: 89.7 fl (ref 78.0–100.0)
Monocytes Absolute: 0.5 10*3/uL (ref 0.1–1.0)
Monocytes Relative: 7 % (ref 3.0–12.0)
Neutro Abs: 3.6 10*3/uL (ref 1.4–7.7)
Neutrophils Relative %: 48.7 % (ref 43.0–77.0)
Platelets: 233 10*3/uL (ref 150.0–400.0)
RBC: 4.04 Mil/uL (ref 3.87–5.11)
RDW: 14.3 % (ref 11.5–15.5)
WBC: 7.3 10*3/uL (ref 4.0–10.5)

## 2021-12-18 LAB — PHOSPHORUS: Phosphorus: 3.5 mg/dL (ref 2.3–4.6)

## 2021-12-18 LAB — MAGNESIUM: Magnesium: 2.3 mg/dL (ref 1.5–2.5)

## 2021-12-18 NOTE — Telephone Encounter (Signed)
  Pulmonary clinic triage or Raquel Sarna - this is for urgent action 12/18/2021 ASAP (cc SOC MD - Dr Erin Fulling and research RN - Ander Purpura)  CRN Lauren- > texted me the PI on DWN study that patient having loose stool and foul smlling stools. Called Donna Holmes 1950-04-20  She reports that since her trip to [place], [place] (Around labor day) and being on study drug she had new onset AEs. All of this is resolved except soft, loose stool and foul smelling stools. Thse 2 latter symptoms improved esp after stopping the study drug IP on 11/22/21 but after initial improvement persists. STool sometime is small volume but sometimes larger. Atleast 3-4 x/day.. Says smell is bad.  No dysuria. No fever  Still off IP since 11/22/21  Past and med reiovew: show frequent UTIs, on esbriet and on fish oil  A IPF patietnt in research - off study drug since 11/22/21 but still on esbriet  Diarrhea - AE - could be related now to study drug from pat but need to [place] out Esbriet as cause, fish oil as cause and UTI. Ensure no complications either  Plan  - stop fish oil  - stop esbriet for 1 week and then restart basd on our advice -> might have to stop for 2 weeks  - come into lab 12/18/2021=- check UA and urine cutlure, stool PCR inclujding C diff, cbc, bmet, lft, mag , phos 12/18/2021 -AS standard of care   SIGNATURE    Dr. Brand Males, M.D., F.C.C.P, ACRP-CPI Pulmonary and [place], [facility] [place] Staff Physician, [place] Director - Interstitial Lung Disease  Program  Pulmonary [facility] @ [place], [place], [address]   Pager: [phone], If no answer  OR between  19:00-7:00h: page [phone] Telephone (research): [phone]  10:31 AM 12/18/2021   10:31 AM 12/18/2021

## 2021-12-18 NOTE — Telephone Encounter (Signed)
Called and spoke with patient. She verbalized understanding of the labs. She will go to the [place] office since our lab is not capable of giving the supplies for the UA and stool cultures.   Nothing further needed at time of call.

## 2021-12-19 LAB — URINE CULTURE
MICRO NUMBER:: 14018645
Result:: NO GROWTH
SPECIMEN QUALITY:: ADEQUATE

## 2021-12-21 ENCOUNTER — Telehealth: Payer: Self-pay | Admitting: Internal Medicine

## 2021-12-21 ENCOUNTER — Other Ambulatory Visit: Payer: Medicare Other

## 2021-12-21 DIAGNOSIS — R197 Diarrhea, unspecified: Secondary | ICD-10-CM

## 2021-12-21 DIAGNOSIS — Z5181 Encounter for therapeutic drug level monitoring: Secondary | ICD-10-CM

## 2021-12-21 NOTE — Telephone Encounter (Signed)
Please let her know that -we will hold pirfenidone for a total of 2 weeks and then gradually restart at 1 pill 3 times daily for 1 week followed by 2 pills 3 times daily for 1 week and then go to the full dose of 3 pills 3 times daily  Meanwhile we will await the stool PCR results to see if any changes need to be made  She should try to eat some plain [place] yogurt.  My personal favorites are [employer], [place], [place] brands .  Even if she eats 1 cup a day it can help with some probiotic reset

## 2021-12-21 NOTE — Telephone Encounter (Signed)
Called and spoke with pt letting her know recs per MR. While speaking with pt, pt said that her Donna Holmes is the 1 large pill of 801 mg where she takes 1 pill three times a day.  MR, please advise if you are fine with pt restarting back at this dose in 2 weeks or if you want to have pt drop down to doing the 279m with the instructions you stated.

## 2021-12-21 NOTE — Telephone Encounter (Signed)
Checked pt's chart and it does not look like stool samples were given yet.  Called and spoke with pt letting her know the results of the bloodwork and urine test and she verbalized understanding. Asked pt if she had been able to do the stool sample yet and she said that she had collected it this morning and was going to get it brought in today for review.  Asked pt how she felt after stopping the Esbriet and Fish Oil and she said that she was feeling better. Routing to MR.

## 2021-12-21 NOTE — Telephone Encounter (Signed)
Blood and urine - normal  Plan Did she give the stool studies this past Friday? Is she feeling better after stopping esbriet and fish oil?  Let me know so can give further instructions

## 2021-12-22 NOTE — Telephone Encounter (Signed)
Called and spoke with patient. She verbalized understanding.   Nothing further needed at time of call.

## 2021-12-22 NOTE — Telephone Encounter (Signed)
In this case because she showed after the 2-week call today to 1 large pill 1 time daily for 1 week and then 1 large pill 2 times daily for 1 week and then go to 3 pills daily.  She needs to make sure they are adequately spaced by at least 5 to 6 hours between dosing

## 2021-12-23 LAB — GI PROFILE, STOOL, PCR
Adenovirus F 40/41: NOT DETECTED
Astrovirus: NOT DETECTED
C difficile toxin A/B: NOT DETECTED
Campylobacter: NOT DETECTED
Cryptosporidium: NOT DETECTED
Cyclospora cayetanensis: NOT DETECTED
Entamoeba histolytica: NOT DETECTED
Enteroaggregative E coli: DETECTED — AB
Enteropathogenic E coli: DETECTED — AB
Enterotoxigenic E coli: DETECTED — AB
Giardia lamblia: NOT DETECTED
Norovirus GI/GII: NOT DETECTED
Plesiomonas shigelloides: NOT DETECTED
Rotavirus A: NOT DETECTED
Salmonella: NOT DETECTED
Sapovirus: NOT DETECTED
Shiga-toxin-producing E coli: NOT DETECTED
Shigella/Enteroinvasive E coli: NOT DETECTED
Vibrio cholerae: NOT DETECTED
Vibrio: NOT DETECTED
Yersinia enterocolitica: NOT DETECTED

## 2021-12-24 MED ORDER — AZITHROMYCIN 500 MG PO TABS: 500.0000 mg | ORAL_TABLET | Freq: Every day | ORAL | 0 refills | Status: DC

## 2021-12-24 NOTE — Telephone Encounter (Signed)
Allergy list updated to remove study-related medications. Routing to triage for remaining tasks. Thanks!  Knox Saliva, PharmD, MPH, BCPS, CPP Clinical Pharmacist (Rheumatology and Pulmonology)

## 2021-12-24 NOTE — Telephone Encounter (Signed)
  Spoke to patient Donna Holmes 1950-08-13   - past hx -> [facility] 2017 [facility] -> colonoscopy: diverticulosis - esbriet x 1 year -> never caused diarrhea or foul smell - started study drug -> approx 11/11/21 -> soon after  by Labor day weekend got diarreha with foul smell - stopped study dryg 11/22/21 and then diarrhea got better but foul smell persists - stopped fish oil and esbreit around 12/18/21 -> as of 12/24/2021 -> diahrrea even better but foul smell persists . No fever - - blood and urine - normal   - stool- positive E coilii.   PLAN (dw/ Tye Savoy APP in [facility]) and aptient   - given fact diarrhea with foul smell x > 6 weeks -> Azithromycin 575m once daily x  3 day (Pulm triage pls send this - patient denied allergy, QTc 436 in 2016) [facility] (Plss make referral) - continue to hold esbriet and fish oil until futher notice -[facility]- patient is off study drug - please remove study related allergy meds    Latest Reference Range & Units 12/21/21 13:48  Adenovirus F 40/41 Not Detected  Not Detected  Astrovirus Not Detected  Not Detected  Campylobacter Not Detected  Not Detected  C difficile toxin A/B Not Detected  Not Detected  Cryptosporidium Not Detected  Not Detected  Cyclospora cayetanensis Not Detected  Not Detected  E coli O157 Not Detected  Not applicable  Enteroaggregative E coli Not Detected  Detected !  Enteropathogenic E coli Not Detected  Detected !  Enterotoxigenic E coli Not Detected  Detected !  Entamoeba histolytica Not Detected  Not Detected  Giardia lamblia Not Detected  Not Detected  Norovirus GI/GII Not Detected  Not Detected  Plesiomonas shigelloides Not Detected  Not Detected  Rotavirus A Not Detected  Not Detected  Salmonella Not Detected  Not Detected  Sapovirus Not Detected  Not Detected  Shiga-toxin-producing E coli Not Detected  Not Detected  Shigella/Enteroinvasive E coli Not Detected  Not Detected  Vibrio Not  Detected  Not Detected  Vibrio cholerae Not Detected  Not Detected  Yersinia enterocolitica Not Detected  Not Detected  !: Data is abnormal

## 2021-12-24 NOTE — Telephone Encounter (Signed)
Called and spoke with pt letting her know that the referral to GI had been placed and that we were sending abx to pharmacy for her and she verbalized understanding. Nothing further needed.

## 2021-12-29 ENCOUNTER — Other Ambulatory Visit (HOSPITAL_COMMUNITY): Payer: Self-pay

## 2021-12-31 ENCOUNTER — Telehealth: Payer: Self-pay | Admitting: Internal Medicine

## 2021-12-31 NOTE — Telephone Encounter (Signed)
Called and spoke with patient. She stated that she wanted to know if she can restart the Esbriet on this coming Saturday. It will be 2 weeks at that point. I asked her how was she feeling and she stated she felt much better. She has finished the abx.   While on the phone, she mentioned that she has not heard from GI in regards to the referral. I confirmed that the referral had been placed.   MR, please advise on the Esbriet restart. Thanks!

## 2022-01-01 NOTE — Telephone Encounter (Signed)
Ok to restart pirfenidone 1 tab tid x 1 week -> 2 tab tid x 1 week -> then 3 tabs tid   STart on Sunday   - ensure 5-6h apart - take with food -

## 2022-01-01 NOTE — Telephone Encounter (Signed)
Called and spoke with patient. Patient stated that she is on the 801 mg dosage and wants to know if she should still take the same way. Patient stated that she isn't taking the lower dose so she wanted to know if Dr. Chase Caller still wants her to take 1 tab tid x 1 week, 2 tab tid x  1 week, then 3 tabs tid x 1 week.  MR, please advise.

## 2022-01-01 NOTE — Telephone Encounter (Signed)
Or that is right.  I have my apologies .  This is a second time making this mistake with her instructions  Take 801 mg 1 tablet daily for 1 week with food and then 1 tablet twice daily for 1 week with food and then 1 tablet 3 times daily to continue

## 2022-01-04 NOTE — Telephone Encounter (Signed)
Called and spoke with patient. She verbalized understanding. She stated that she still has not heard anything from GI despite the referral being placed on 10/12. Advised her that I would call GI to check on the status of the referral.   Called GI at [phone]. The scheduler confirmed that they have the referral but they have been short staffed. Patient is welcomed to contact their office to get scheduled.   Called patient back and she verbalized understanding. She requested that I send the contact information via MyChart since she was currently driving.   Nothing further needed at time of call.

## 2022-01-05 ENCOUNTER — Other Ambulatory Visit: Payer: Self-pay | Admitting: Internal Medicine

## 2022-01-05 DIAGNOSIS — J84112 Idiopathic pulmonary fibrosis: Secondary | ICD-10-CM

## 2022-01-05 DIAGNOSIS — Z006 Encounter for examination for normal comparison and control in clinical research program: Secondary | ICD-10-CM

## 2022-01-06 NOTE — Telephone Encounter (Signed)
Pt scheduled for appointment with Tye Savoy NP on 01/27/22 at 1:30 pm.  Pt verbalized understanding.

## 2022-01-07 ENCOUNTER — Encounter: Payer: Self-pay | Admitting: *Deleted

## 2022-01-07 ENCOUNTER — Ambulatory Visit (HOSPITAL_BASED_OUTPATIENT_CLINIC_OR_DEPARTMENT_OTHER)
Admission: RE | Admit: 2022-01-07 | Discharge: 2022-01-07 | Disposition: A | Discharge status: Home / Self Care | Payer: Medicare Other | Source: Ambulatory Visit | Attending: Internal Medicine | Admitting: Internal Medicine

## 2022-01-07 DIAGNOSIS — J84112 Idiopathic pulmonary fibrosis: Secondary | ICD-10-CM | POA: Insufficient documentation

## 2022-01-07 DIAGNOSIS — Z006 Encounter for examination for normal comparison and control in clinical research program: Secondary | ICD-10-CM

## 2022-01-08 ENCOUNTER — Other Ambulatory Visit (HOSPITAL_COMMUNITY): Payer: Self-pay

## 2022-01-08 NOTE — Research (Signed)
TITLE: A Phase 2, Randomized, Double-Blind, Placebo-Controlled Study to Evaluate the Safety and Efficacy of [id number] in Patients With Idiopathic Pulmonary Fibrosis  Protocol #: [id number] NCT: [place]  Protocol Version: version 2: approved [id number] IB:  version 8.0: approved 20July2022 ICF: Main: V2.0 approved 08Apr2022, modified [id number]  Pregnancy: V2.0 approved 25Jan2023 Lab Manual: V2.0.0 Approved 13Oct2022  Study Design:  This is a randomized, double-blinded, placebo-controlled multicenter study to evaluate the safety and efficacy of [id number] in patients with IPF with or without standard-of-care. 2:1 randomization ratio to [id number] 150 mg BID or the matching placebo for  24 weeks.    Mechanism of Action Proline is one of the largest constituents of collagen. In IPF, there is excessive deposition of collagen. Prolyl-tRNA Synthetase (PRS) is an enzyme that conjugates proline. [id number] (Bersiporocin) is the world's first selective PRS inhibitor that decreases collagen formation and subsequent pro-fibrotic markers.  Administration  A dose of [id number] 150 mg BID will be administered orally in the fasted state or at least 2 hours after the last meal, for 24 weeks.   Key Inclusion Criteria age = 36 years Documented diagnosis of IPF per the 2018 ATS/ERS/JRS/ALAT Clinical Practice Meeting all of the following criteria during the screening period:             FVC ? 40% predicted            DLCOcor ?25% to ? 80%              (FEV1)/FVC ratio ? 0.7             Able to walk at least 150 m in 6MWT, resting SpO2 should be ? 88% with a maximum of 6L O2/min  On a stable dose of pirfenidone OR nintedanib for at least 3 months OR on neither pirfenidone nor nintedanib.  Key Exclusion Criteria Currently taking medication known as a strong CYP2D6 inhibitor OR taking medication known to be CYP2D6 inducers OR CYP2D6 substrate with narrow therapeutic index. GFR  < 30  mL/min/1.34m moderate to severe hepatic impairment (Child-Pugh B and C). Patients with =3upper limit of normal of alanine aminotransferase, aspartate aminotransferase or gamma-glutamyl transpeptidase. Abnormal ECG findings including but not limited to QTc >500 ms.  Pharmacokinetics Urine PK data indicate that renal elimination is not the major clearance pathway for [id number]   Adverse effects and risk Overall, when [id number] administered concomitantly with Pirfenidone and Nintedanib, [id number] mg enteric-coated tablet was generally well tolerated and safe.   Safety data from edition number [id number] abstracted in March 2023.   Gastrointestinal adverse reactions (e.g Diarrhea, abdominal pain, nausea, vomiting) followed by CNS (headache and dizziness) as below were the most commonly observed adverse reactions.  Most adverse reactions were mild or moderate and reversible.  An improved enteric-coated 150 mg tablet (new) was reformulated to lower the initial dissolution rate in pH 6.8. Enteric-coated tablets (new) demonstrated that nausea and vomiting appear to have decreased. While the incidence rate of diarrhea was slightly increased than old formulation administration, the severity was all mild.  Below table comprises predominantly of enteric coated tablet.  Overall adverse event % n=229 Average of 4 different studies [id number]=24(part 1) Esbriet combination [id number]=24(part 2) Ofev combination   Nausea 54 out of 229 23.58% 6(25%) 2(8.33%)  Vomiting 37 out of 205 18.04% 1(4.17%) 1(4.17%)  Diarrhea 37 out of 229 16.15% 2(8.33%) 7(29.17%)  Abdominal pain 7 out of 229 3.05% 0 1(4.17%)  Constipation 4 out of 54  7.4% - -  Abdominal discomfort 4 out of 205  1.95% 0 1(4.17%)  Headache 25 out of 205  12.19% 4(16.67%) 1(4.17%)  Dizziness 8 out of 175 (4.57%) 3(12.50%) 1(4.17%)   Severity of TEAE Table  Overall adverse event % n=229 Average of 4 different studies  [id number] n=24(part 1) Esbriet combination [id number] n=24(part 2) Ofev combination   TEAE - occurrences  382 19 18  Severity- mild 351 (91.88%) 18 (94.73%) 17 (94.44%)   Moderate 31 (8.11%) 1 (5.26%) 1 (5.55%)  Severe 0 (0%) 0 (0%)  0 (0%)     91% TEAEs were mild  (96 of 106 TEAEs, including all 16 TEAEs in the placebo group). 9% (n=10) were moderate.   TEAEs occurred in a dose dependent manner with 7 of the 10 occurring in daily doses >= 615m  All moderate TEAEs were classed as Gastrointestinal Disorders (nausea, vomiting, diarrhea, and epigastric discomfort), were considered recovered/resolved within 24 hours, and had no sequelae. There were no severe, life-threatening or fatal TEAEs across the study. There were no subjects with serious TEAEs, and there were no subjects with TEAEs leading to IP discontinuation during Part 1 (SAD) of the study.  EKG concerns There were no effects on ECG with dose up to 80 mg/kg in cynomolgus monkeys. In humans no EKG abnormalities reported  except in Part 2 (([id number] concomitantly with Nintedanib), one case of PR prolongation LFT concerns In rats, reversible, minimal DWN12088HCl-related centrilobular hepatocyte hypertrophy was noted in the liver administered ?50 mg/kg/day and was considered consistent with DWN12088HCl-related induction of hepatocellular enzymes.  In humans, on investigation in the MAD study of six subjects, hepatic enzyme was increased in one participant (16.7%).   Rare concerns based on animal data - not seen in humans Excessive salivation in rats  at doses >50 mg/kg/day and mildly diminished appetite in 1 monkey was observed on one occasion.  With extremely high doses of 1200 mg/kg decreased activity, increased/labored/shallow respiration, gasping, vocalizing and piloerection was observed in female (but not female) rats 6h post dose.    [facility] @ CCeloronCoordinator note :   This visit  for Subject Donna Civellowith DOB: [birth year]on 01/07/2022 for the above protocol is Visit/Encounter # 4 week follow up for end of study and is for purpose of research.   Protocol Version: version 2: approved 31Mar22022 IB:  version 8.0: approved 20July2022 ICF: Main: V2.0 approved 08Apr2022, modified [id number] Pregnancy: V2.0 approved 25Jan2023 Lab Manual: V2.0.0 Approved 13Oct2022  Subject expressed not to continue as a study subject after this final visit. Subject confirmed that there was no change in contact information (e.g. address, telephone, email). Subject thanked for participation in research and contribution to science.  In this visit 01/08/2022 the subject will be evaluated by Sub-Investigator named Dr. WUnice Cobble This research coordinator has verified that the above investigator is up to date with his/her training logs.   The Subject was informed that the PI MBrand Males MD continues to have oversight of the subject's visits and course  through relevant discussions, reviews and also specifically of this visit by routing of this note to the PI.    This visit is a key visit of 4 week follow up after stopping study drug.  The PI is not available for this visit.  Because the PI is NOT available, the sub-I reported and CRC has confirmed that the PI has discussed the visit a-prior with the sub-investigator.   Subject came for visit no new medications have  been started and all AE have been resolved. Subject did complete the End Of Treatment HRCT at this visit. Subject also completed all assessments scheduled for the Follow up visit. No further questions or concerns at this time.     Signed by  Jaye Beagle RN, MSN/MBA  Clinical Research Coordinator / Nurse [facility]  [place], [place] 2:10 PM 01/08/2022

## 2022-01-09 ENCOUNTER — Telehealth: Payer: Self-pay | Admitting: Internal Medicine

## 2022-01-09 DIAGNOSIS — R1010 Upper abdominal pain, unspecified: Secondary | ICD-10-CM

## 2022-01-09 NOTE — Telephone Encounter (Signed)
none

## 2022-01-09 NOTE — Progress Notes (Signed)
Donna Holmes, [place]  07-31-1950, was seen as subject in a clinical trial /Protocol # [id number]. Cardiopulmonary symptoms are stable Other or new symptoms denied . The prior significant GI symptoms documented in 12/11/2021 research encounter have resolved. GI consultation to assess Enteroaggregative E coli isolate on GI Profile is pending. Pertinent physical findings include:Slight tachycardia;S1 slightly accentuated;insignificant clubbing of nailbeds;sticky rales over lower 2/3 of lungs posteriorly. All physical findings NCS. In reference to the slight tachycardia; this has been documented on prior EKGs. Chart reviewed. Mild anemia has resolved. TSH was therapeutic 04/22/2021. Current glucose is 129; A1c was 6% on 2/8. Update of TSH & A1c as per PCP.                                                                     Hendricks Limes MD,SI

## 2022-01-09 NOTE — Telephone Encounter (Signed)
-----  Message from Hendricks Limes, MD sent at 01/09/2022 10:56 AM EDT ----- GI evaluation for E coli isolate is pending. The tachycardia seems to be consistent finding on [facility] EKGs. In Feb TSH was therapeutic. Also A1c was 6%; current glucose 129. Minimal anemia has resolved .  CT 10/26 only included upper abdomen.Should we rule out carcinoid & pheochromocytoma if these have not been assessed previously? Clair Gulling, I defer to you as her PCP. [employer]

## 2022-01-14 ENCOUNTER — Other Ambulatory Visit (HOSPITAL_COMMUNITY): Payer: Self-pay

## 2022-01-19 ENCOUNTER — Telehealth: Payer: Self-pay | Admitting: Internal Medicine

## 2022-01-19 NOTE — Telephone Encounter (Signed)
Left message for patient to call back to schedule Medicare Annual Wellness Visit  ? ?Last AWV  05/08/18 ? ?Please schedule at anytime with [facility]-Nurse Health Advisor if patient calls the office back.   ? ? ? ?Any questions, please call me at [phone]  ?

## 2022-01-27 ENCOUNTER — Encounter: Payer: Self-pay | Admitting: Nurse Practitioner

## 2022-01-27 ENCOUNTER — Ambulatory Visit: Payer: Medicare Other | Admitting: Nurse Practitioner

## 2022-01-27 VITALS — BP 102/60 | HR 88 | Ht 63.0 in | Wt 131.0 lb

## 2022-01-27 DIAGNOSIS — R197 Diarrhea, unspecified: Secondary | ICD-10-CM

## 2022-01-27 NOTE — Progress Notes (Signed)
Chief Complaint:  diarrhea   Assessment & Plan   # 71 yo female with acute nausea, vomiting and diarrhea, resolved . Symptoms started about the same time she started an  investigational drug for ILD but was also diagnosed with Enterotoxigenic E.Coli infection.  Investigational drug was discontinued. Due to persistent diarrhea the E.coli infection was treated with Azithromycin.  Bowel movements have been back normal for the last several weeks and no further N/V.  No need for further GI workup given total resolution of GI symptoms. She will call for any recurrent symptoms  # Chronic GERD, asymptomatic on daily PPI.   # Colon cancer screening.  She had a colonoscopy by Dr. Earlean Shawl in 2017. Prep was fair.  A polyp was removed but it appeared to be inflammatory.  She was advised to have a 10 year follow up  HPI:    Donna Holmes is a 71 y.o. year old female  new to the practice with a past medical history of vitamin B12 deficiency, pulmonary fibrosis, Chagas disease, GERD, diverticulosis.  See PMH / [place] for additional history  Patient referred by Dr. Chase Caller for diarrhea.  Patient has pulmonary fibrosis.  She was started on an  investigational drug and within a month or so developed N / V / D.  The drug was discontinued.  It sounds like she has some lingering diarrhea.  Stool studies were checked and positive for enteric toxigenic E. coli .  Due to the lingering diarrhea she was treated with azithromycin and referred here to make sure that no further work-up was needed.   Interval History:  Since treatment of E. coli and discontinuation of investigational drug patient has not had any further nausea, vomiting or diarrhea.  Her stools have been back to normal for several weeks.  She is having about 2 formed bowel movements a day.  No blood in stool.  Weight stable.  No abdominal pain  Previous Labs / Imaging::    Latest Ref Rng & Units 12/18/2021    3:01 PM 11/24/2021    3:07 PM 04/22/2021    10:36 AM  CBC  WBC 4.0 - 10.5 K/uL 7.3  6.8  7.7   Hemoglobin 12.0 - 15.0 g/dL 12.0  11.7  12.0   Hematocrit 36.0 - 46.0 % 36.3  35.3  36.9   Platelets 150.0 - 400.0 K/uL 233.0  290.0  328.0     Lab Results  Component Value Date   LIPASE 58.0 11/24/2021      Latest Ref Rng & Units 12/18/2021    3:01 PM 11/24/2021    3:07 PM 09/07/2021    1:03 PM  CMP  Glucose 70 - 99 mg/dL 129  84    BUN 6 - 23 mg/dL 22  21    Creatinine 0.40 - 1.20 mg/dL 0.72  0.71    Sodium 135 - 145 mEq/L 135  137    Potassium 3.5 - 5.1 mEq/L 3.7  4.4    Chloride 96 - 112 mEq/L 100  103    CO2 19 - 32 mEq/L 30  29    Calcium 8.4 - 10.5 mg/dL 9.4  9.4    Total Protein 6.0 - 8.3 g/dL 7.2  7.0  7.4   Total Bilirubin 0.2 - 1.2 mg/dL 0.3  0.3  0.3   Alkaline Phos 39 - 117 U/L 55  45  59   AST 0 - 37 U/L _0 ALT  0 - 35 U/L _0 Component Ref Range & Units 1 mo ago  Campylobacter Not Detected Not Detected  C difficile toxin A/B Not Detected Not Detected  Plesiomonas shigelloides Not Detected Not Detected  Salmonella Not Detected Not Detected  Vibrio Not Detected Not Detected  Vibrio cholerae Not Detected Not Detected  Yersinia enterocolitica Not Detected Not Detected  Enteroaggregative E coli Not Detected Detected Abnormal   Comment:                   Client Requested Flag  Enteropathogenic E coli Not Detected Detected Abnormal   Comment:                   Client Requested Flag  Enterotoxigenic E coli Not Detected Detected Abnormal   Comment:                   Client Requested Flag  Shiga-toxin-producing E coli Not Detected Not Detected  E coli O157 Not Detected Not applicable  Shigella/Enteroinvasive E coli Not Detected Not Detected  Cryptosporidium Not Detected Not Detected  Cyclospora cayetanensis Not Detected Not Detected  Entamoeba histolytica Not Detected Not Detected  Giardia lamblia Not Detected Not Detected  Adenovirus F 40/41 Not Detected Not Detected  Astrovirus Not  Detected Not Detected  Norovirus GI/GII Not Detected Not Detected  Rotavirus A Not Detected Not Detected  Sapovirus Not Detected Not Detected  Resulting Agency  [facility]   Previous GI Evaluation   February 2017 screening colonoscopy -Prep fair -Terminal ileum was intubated and appeared normal over its distal 5 cm - 5 mm polyp was removed from the cecum.  A 10 mm sessile polyp was removed from the right colon -Diverticulosis throughout the colon.   Path Colonic mucosa with no significant abnormalities Probable inflammatory polyp  Past Medical History:  Diagnosis Date   ALLERGIC RHINITIS 05/19/2009   Anemia, iron deficiency 06/09/2015   B12 deficiency 08/02/2018   Chagas' disease 93/73/4287   Complication of anesthesia    GERD (gastroesophageal reflux disease)    Headache(784.0) 05/19/2009   Past Surgical History:  Procedure Laterality Date   ANTERIOR AND POSTERIOR REPAIR WITH SACROSPINOUS FIXATION N/A 05/16/2014   Procedure: ANTERIOR AND POSTERIOR REPAIR WITH SACROSPINOUS LIGAMENT SUSPENSION;  Surgeon: Luz Lex, MD;  Location: [facility];  Service: Gynecology;  Laterality: N/A;   BARTHOLIN CYST MARSUPIALIZATION     BUNIONECTOMY Right    LAPAROSCOPIC ASSISTED VAGINAL HYSTERECTOMY N/A 05/16/2014   Procedure: LAPAROSCOPIC ASSISTED VAGINAL HYSTERECTOMY;  Surgeon: Luz Lex, MD;  Location: [facility];  Service: Gynecology;  Laterality: N/A;   NEUROMA SURGERY Right    2nd interspace   Family History  Problem Relation Age of Onset   Alcohol abuse Father    Hypertension Mother    Prostate cancer Maternal Uncle    Social History   Tobacco Use   Smoking status: Never   Smokeless tobacco: Never  Vaping Use   Vaping Use: Never used  Substance Use Topics   Alcohol use: No   Drug use: No   Current Outpatient Medications  Medication Sig Dispense Refill   acetaminophen (TYLENOL) 500 MG tablet Take 500 mg by mouth every 6 (six) hours as needed.     albuterol (VENTOLIN HFA) 108 (90 Base)  MCG/ACT inhaler TAKE 2 PUFFS BY MOUTH EVERY 6 HOURS AS NEEDED FOR WHEEZE OR SHORTNESS OF BREATH 8.5 each 5   Ascorbic Acid (VITAMIN C) 250 MG CHEW  Chew 250 mg by mouth daily.     azithromycin (ZITHROMAX) 500 MG tablet Take 1 tablet (500 mg total) by mouth daily. 3 tablet 0   calcium carbonate (OS-CAL) 600 MG TABS tablet Take 600 mg by mouth daily.     cetirizine-pseudoephedrine (ZYRTEC-D) 5-120 MG per tablet Take 1 tablet by mouth once as needed for allergies.     Cholecalciferol (VITAMIN D-3 PO) Take 4,000 Units by mouth daily.     CRANBERRY PO Take by mouth.     estradiol (ESTRACE) 0.1 MG/GM vaginal cream Place 1 Applicatorful vaginally 2 (two) times a week.     Estradiol-Estriol-Progesterone (BIEST/PROGESTERONE) CREA Apply 2 clicks daily, except on Sunday. 40 g PRN   Glucosamine-Chondroitin (GLUCOSAMINE CHONDR COMPLEX PO) Take 1 tablet by mouth daily.     Omega-3 Fatty Acids (FISH OIL) 1000 MG CAPS Take 3 capsules by mouth daily.      pantoprazole (PROTONIX) 40 MG tablet TAKE 1 TABLET BY MOUTH EVERY DAY 90 tablet 2   Pirfenidone (ESBRIET) 801 MG TABS Take 1 tablet (801 mg) by mouth with breakfast, with lunch, and with evening meal. 270 tablet 1   RESTASIS 0.05 % ophthalmic emulsion 1 drop 2 (two) times daily.     rosuvastatin (CRESTOR) 10 MG tablet Take 1 tablet (10 mg total) by mouth daily. 90 tablet 3   Vitamin A 2400 MCG (8000 UT) CAPS Take 2,400 mcg by mouth daily.     Vitamin E 180 MG (400 UNIT) CAPS Take 180 mg by mouth daily.     No current facility-administered medications for this visit.   Allergies  Allergen Reactions   Aspirin    Codeine Nausea And Vomiting   Oxycontin [Oxycodone Hcl] Nausea Only and Other (See Comments)    FELT LIKE I WAS GOING TO FAINT   Statins Other (See Comments)    myalgias   Sulfa Antibiotics Nausea And Vomiting   Ibuprofen Other (See Comments)    Other reaction(s): GI Bleed    Review of Systems: All other systems reviewed and negative except  where noted in HPI.   Wt Readings from Last 3 Encounters:  11/24/21 128 lb (58.1 kg)  11/10/21 130 lb 6.4 oz (59.1 kg)  10/21/21 133 lb 12.8 oz (60.7 kg)    Physical Exam   BP 102/60   Pulse 88   Ht _0  (1.6 m)   Wt 131 lb (59.4 kg)   BMI 23.21 kg/m for that.  Constitutional:  Generally well appearing female in no acute distress. Psychiatric: Pleasant. Normal mood and affect. Behavior is normal. EENT: Pupils normal.  Conjunctivae are normal. No scleral icterus. Neck supple.  Cardiovascular: Normal rate, regular rhythm. No edema Pulmonary/chest: Effort normal and breath sounds normal. No wheezing, rales or rhonchi. Abdominal: Soft, nondistended, nontender. Bowel sounds active throughout. There are no masses palpable. No hepatomegaly. Neurological: Alert and oriented to person place and time. Skin: Skin is warm and dry. No rashes noted.  Tye Savoy, NP  01/27/2022, 1:23 PM  Cc:  Referring Provider Brand Males, MD

## 2022-01-27 NOTE — Patient Instructions (Addendum)
Call for any recurrent GI symptoms.  If you are age 73 or older, your body mass index should be between 23-30. Your Body mass index is 23.21 kg/m. If this is out of the aforementioned range listed, please consider follow up with your Primary Care Provider.  __________________________________________________________  The [facility] providers would like to encourage you to use [facility] to communicate with providers for non-urgent requests or questions.  Due to long hold times on the telephone, sending your provider a message by [facility] may be a faster and more efficient way to get a response.  Please allow 48 business hours for a response.  Please remember that this is for non-urgent requests.   Due to recent changes in healthcare laws, you may see the results of your imaging and laboratory studies on MyChart before your provider has had a chance to review them.  We understand that in some cases there may be results that are confusing or concerning to you. Not all laboratory results come back in the same time frame and the provider may be waiting for multiple results in order to interpret others.  Please give [place] 48 hours in order for your provider to thoroughly review all the results before contacting the office for clarification of your results.     Thank you for choosing me and [facility].  Tye Savoy, NP

## 2022-02-05 ENCOUNTER — Other Ambulatory Visit (HOSPITAL_COMMUNITY): Payer: Self-pay

## 2022-02-18 ENCOUNTER — Other Ambulatory Visit: Payer: Medicare Other

## 2022-02-20 IMAGING — DX DG CHEST 2V
2 series · 2 of 2 positions shown · non-contrast
Comparison: Chest x-ray dated 04/15/2020 and 05/19/2009

CLINICAL DATA: Shortness of breath, bibasilar crackles.

EXAM:
CHEST - 2 VIEW

[chest pa]
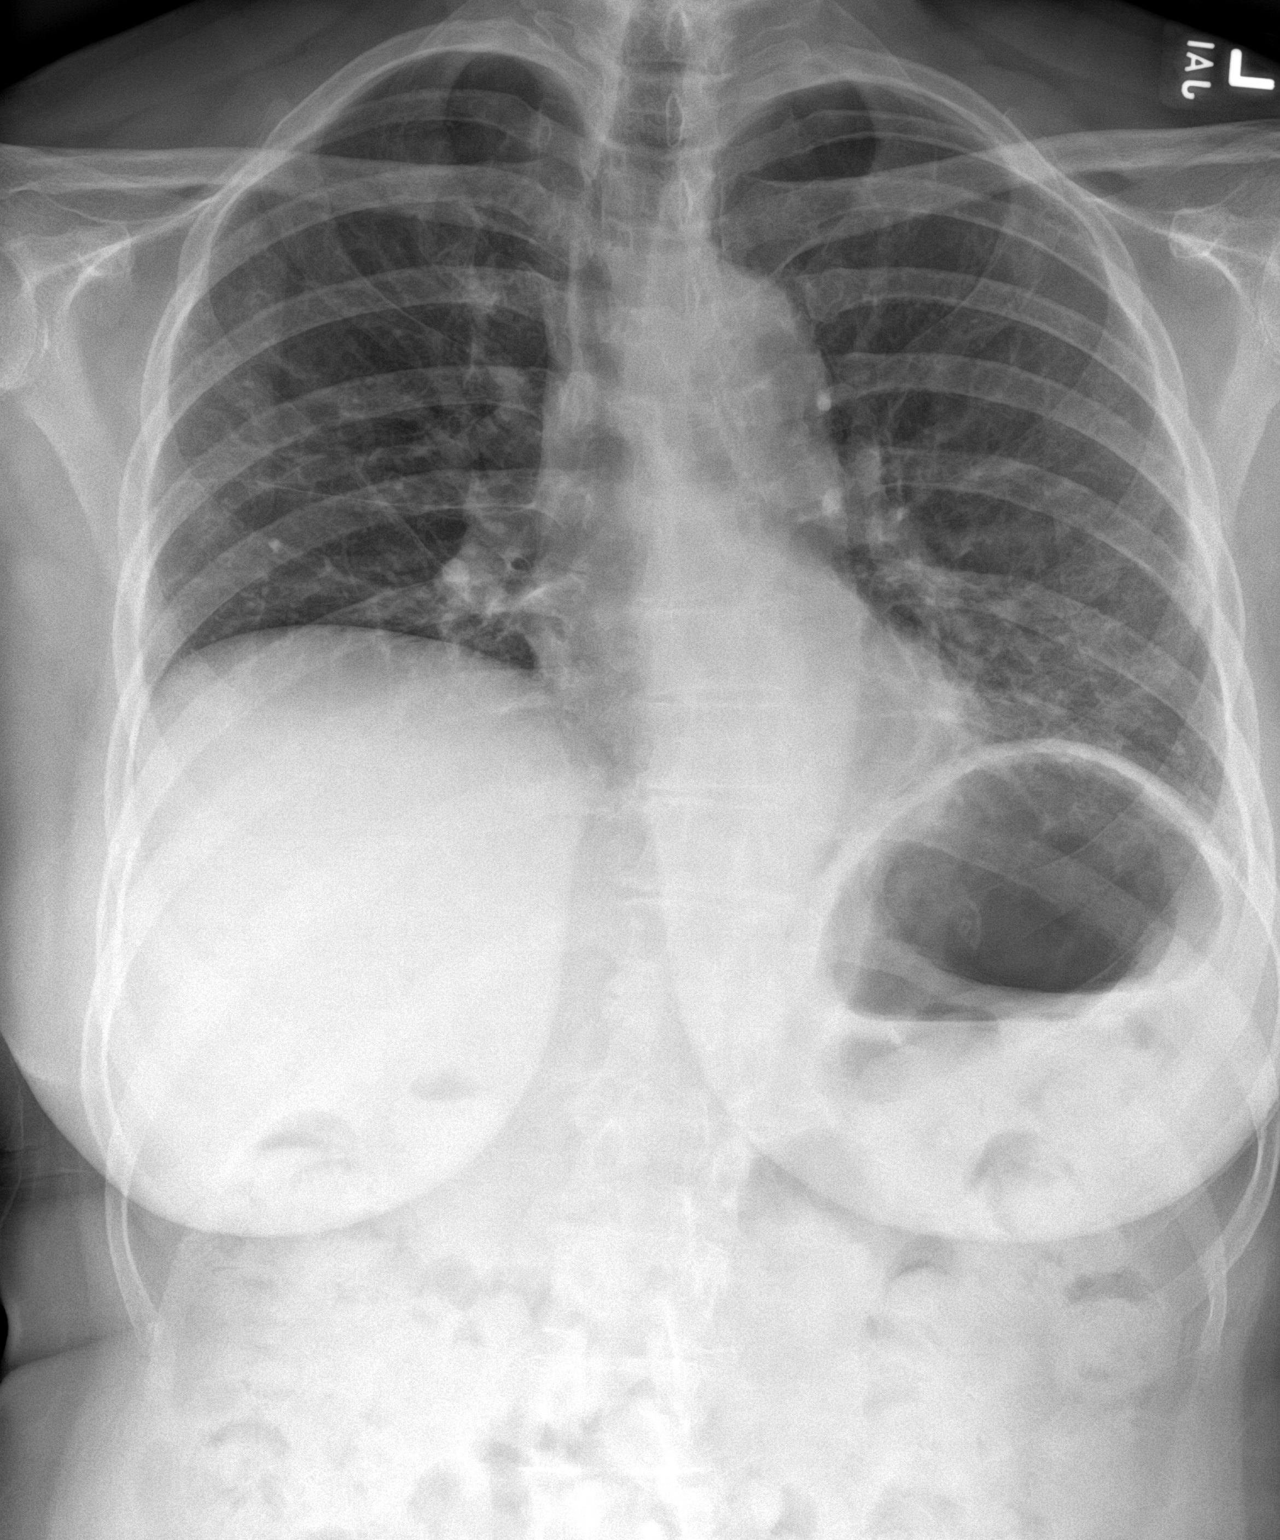

[chest lat]
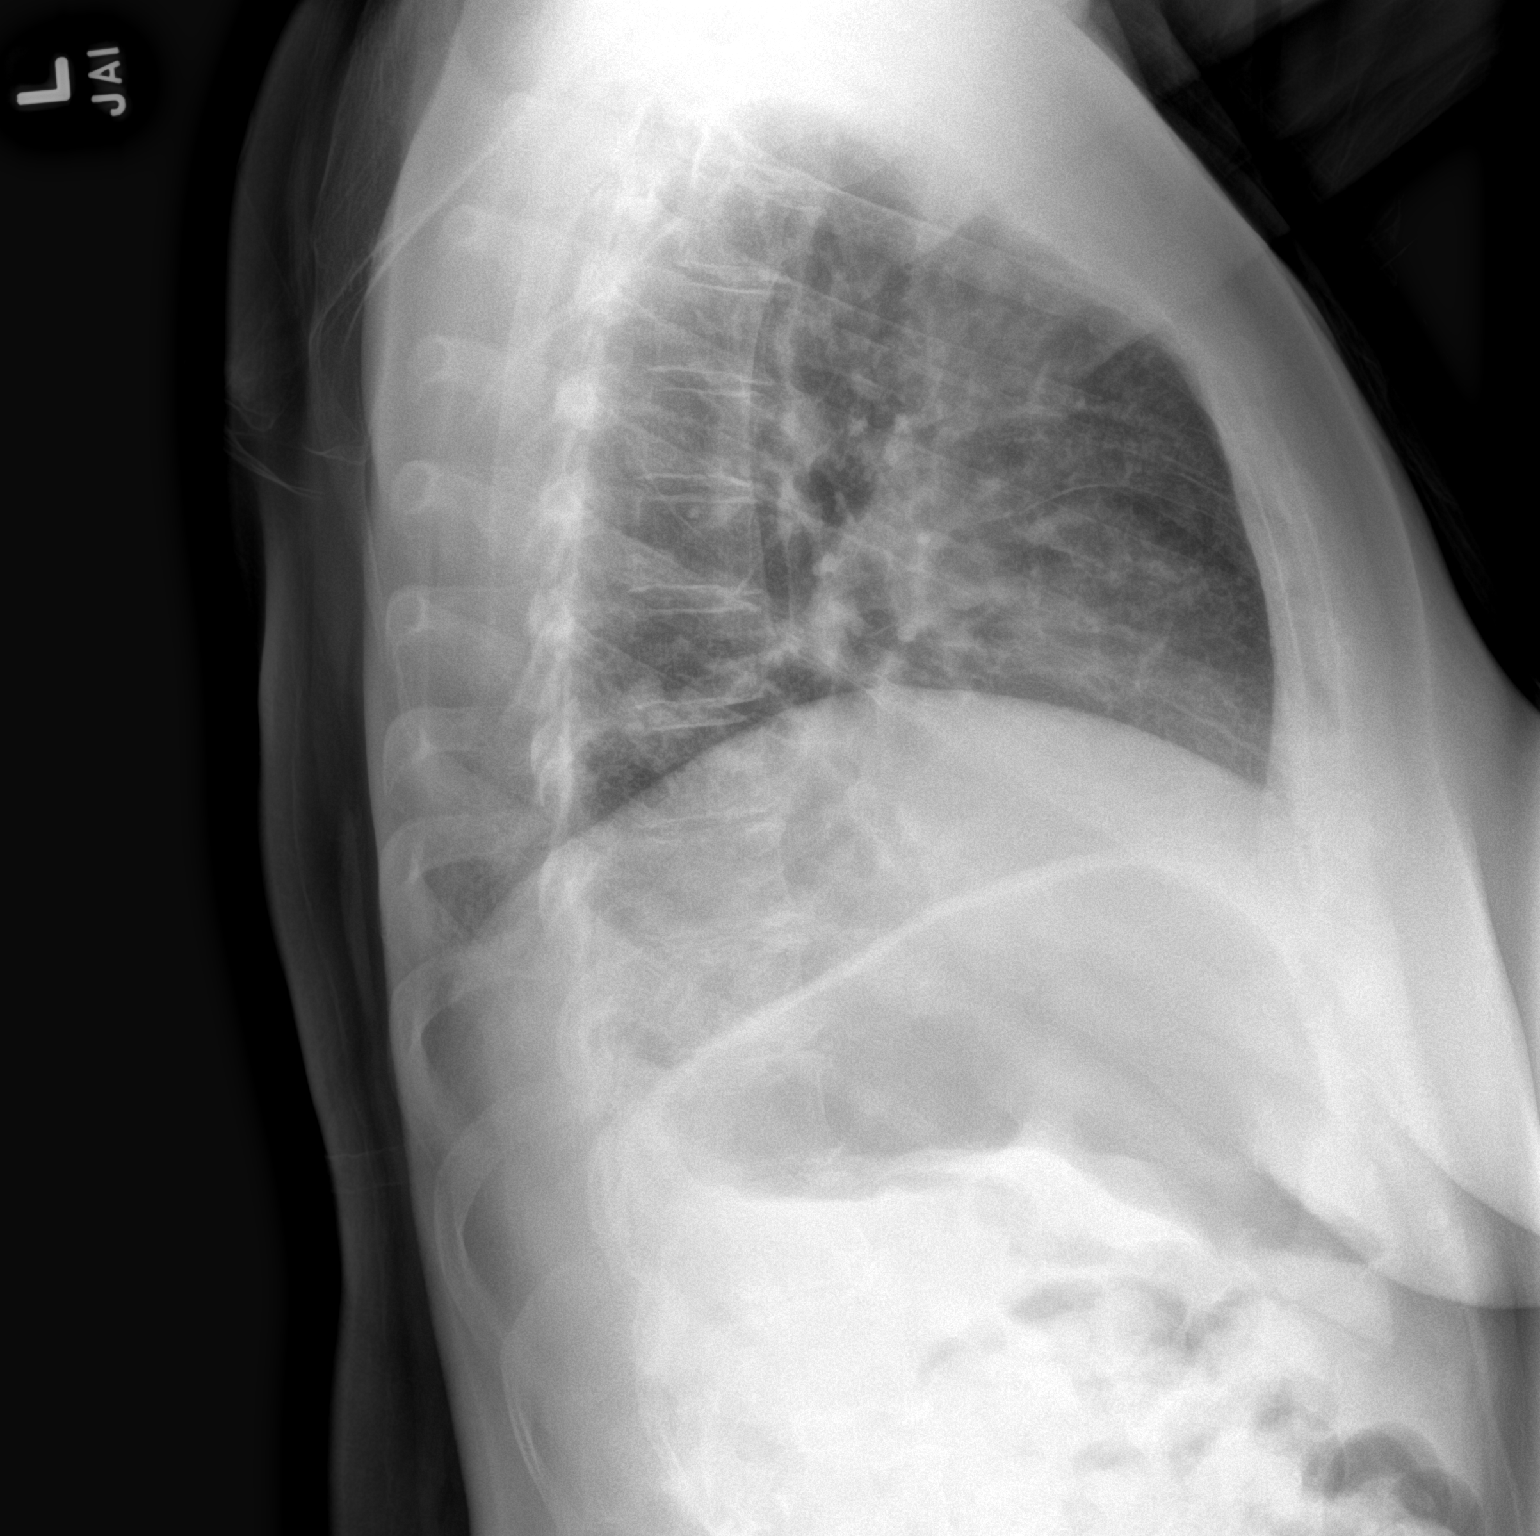

[2 of 2 positions shown; findings below may reference images not displayed]

FINDINGS: Heart size and mediastinal contours are within normal limits. Study
is hypoinspiratory with crowding of the perihilar and bibasilar
bronchovascular markings. Given the low lung volumes, lungs are
clear, possible mild atelectasis at the LEFT lung base. No pleural
effusion or pneumothorax. Osseous structures about the chest are
unremarkable.
IMPRESSION: Low lung volumes.  No evidence of pneumonia or pulmonary edema.

## 2022-02-21 ENCOUNTER — Encounter: Payer: Self-pay | Admitting: Internal Medicine

## 2022-02-23 NOTE — Telephone Encounter (Signed)
Patient updated [place] with her flu vaccine from the pharmacy but is now asking if you recommend the RSV vaccine.

## 2022-02-26 ENCOUNTER — Other Ambulatory Visit (HOSPITAL_COMMUNITY): Payer: Self-pay

## 2022-02-26 ENCOUNTER — Telehealth: Payer: Self-pay | Admitting: Pharmacist

## 2022-02-26 NOTE — Telephone Encounter (Signed)
Received notification from [facility] regarding a prior authorization for PIRFENIDONE. Authorization has been APPROVED from 02/26/22 to 03/15/2023. Approval letter sent to scan center.  Patient can fill through [facility]: [phone]   Authorization # [id number] Phone # [phone]  Knox Saliva, PharmD, MPH, BCPS, CPP Clinical Pharmacist (Rheumatology and Pulmonology)

## 2022-02-26 NOTE — Telephone Encounter (Signed)
Per [place], [place] set to expire. Submitted a Prior Authorization RENEWAL request to [facility] for PIRFENIDONE via CoverMyMeds. Will update once we receive a response.  Key: [id number]  Knox Saliva, PharmD, MPH, BCPS, CPP Clinical Pharmacist (Rheumatology and Pulmonology)

## 2022-03-24 ENCOUNTER — Other Ambulatory Visit: Payer: Self-pay | Admitting: Obstetrics and Gynecology

## 2022-03-24 DIAGNOSIS — Z1231 Encounter for screening mammogram for malignant neoplasm of breast: Secondary | ICD-10-CM

## 2022-03-31 ENCOUNTER — Encounter: Payer: Self-pay | Admitting: Internal Medicine

## 2022-03-31 ENCOUNTER — Other Ambulatory Visit: Payer: Self-pay | Admitting: Internal Medicine

## 2022-03-31 ENCOUNTER — Ambulatory Visit: Payer: Medicare Other | Admitting: Internal Medicine

## 2022-03-31 VITALS — BP 108/60 | HR 100 | Temp 98.1°F | Ht 63.0 in | Wt 131.0 lb

## 2022-03-31 DIAGNOSIS — J309 Allergic rhinitis, unspecified: Secondary | ICD-10-CM | POA: Diagnosis not present

## 2022-03-31 DIAGNOSIS — R739 Hyperglycemia, unspecified: Secondary | ICD-10-CM | POA: Diagnosis not present

## 2022-03-31 DIAGNOSIS — R058 Other specified cough: Secondary | ICD-10-CM | POA: Diagnosis not present

## 2022-03-31 LAB — POC INFLUENZA A&B (BINAX/QUICKVUE)
Influenza A, POC: NEGATIVE
Influenza B, POC: NEGATIVE

## 2022-03-31 LAB — POCT RESPIRATORY SYNCYTIAL VIRUS: RSV Rapid Ag: NEGATIVE

## 2022-03-31 LAB — POC SOFIA SARS ANTIGEN FIA: SARS Coronavirus 2 Ag: NEGATIVE

## 2022-03-31 MED ORDER — HYDROCODONE BIT-HOMATROP MBR 5-1.5 MG/5ML PO SOLN: 5.0000 mL | Freq: Four times a day (QID) | ORAL | 0 refills | Status: AC | PRN

## 2022-03-31 MED ORDER — LEVOFLOXACIN 500 MG PO TABS: 500.0000 mg | ORAL_TABLET | Freq: Every day | ORAL | 0 refills | Status: AC

## 2022-03-31 NOTE — Assessment & Plan Note (Signed)
Lab Results  Component Value Date   HGBA1C 6.0 04/22/2021   Stable, pt to continue current medical treatment  - diet, wt control

## 2022-03-31 NOTE — Assessment & Plan Note (Signed)
Stable, cont otc nasacort asd

## 2022-03-31 NOTE — Assessment & Plan Note (Signed)
Mild to mod, c/w bronchitis vs pna, deciens cxr for now, for antibx course levaquin 500 qd,,  to f/u any worsening symptoms or concerns

## 2022-03-31 NOTE — Progress Notes (Signed)
Patient ID: Donna Holmes, female   DOB: 07-21-1950, 72 y.o.   MRN: [id number]        Chief Complaint: follow up prod cough and feverish, hyperglycemia, , allergies,        HPI:  Donna Holmes is a 72 y.o. female Here with acute onset mild to mod 2-3 days ST, HA, general weakness and malaise, with prod cough greenish sputum, but Pt denies chest pain, increased sob or doe, wheezing, orthopnea, PND, increased LE swelling, palpitations, dizziness or syncope  Has intermittent nasal allergy symptoms but this is different.   Pt denies polydipsia, polyuria, or new focal neuro s/s.    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   Complication of anesthesia    GERD (gastroesophageal reflux disease)    Headache(784.0) 05/19/2009   Past Surgical History:  Procedure Laterality Date   ANTERIOR AND POSTERIOR REPAIR WITH SACROSPINOUS FIXATION N/A 05/16/2014   Procedure: ANTERIOR AND POSTERIOR REPAIR WITH SACROSPINOUS LIGAMENT SUSPENSION;  Surgeon: Luz Lex, MD;  Location: [facility];  Service: Gynecology;  Laterality: N/A;   BARTHOLIN CYST MARSUPIALIZATION     BUNIONECTOMY Right    LAPAROSCOPIC ASSISTED VAGINAL HYSTERECTOMY N/A 05/16/2014   Procedure: LAPAROSCOPIC ASSISTED VAGINAL HYSTERECTOMY;  Surgeon: Luz Lex, MD;  Location: [facility];  Service: Gynecology;  Laterality: N/A;   NEUROMA SURGERY Right    2nd interspace    reports that she has never smoked. She has never used smokeless tobacco. She reports that she does not drink alcohol and does not use drugs. family  history includes Alcohol abuse in her father; Hypertension in her mother; Prostate cancer in her maternal uncle. Allergies  Allergen Reactions   Aspirin    Codeine Nausea And Vomiting   Oxycontin [Oxycodone Hcl] Nausea Only and Other (See Comments)    FELT LIKE I WAS GOING TO FAINT   Statins Other (See Comments)    myalgias   Sulfa Antibiotics Nausea And Vomiting   Ibuprofen Other (See Comments)    Other reaction(s): GI Bleed   Current Outpatient Medications on File Prior to Visit  Medication Sig Dispense Refill   acetaminophen (TYLENOL) 500 MG tablet Take 500 mg by mouth every 6 (six) hours as needed.     albuterol (VENTOLIN HFA) 108 (90 Base) MCG/ACT inhaler TAKE 2 PUFFS BY MOUTH EVERY 6 HOURS AS NEEDED FOR WHEEZE OR SHORTNESS OF BREATH 8.5 each 5   Ascorbic Acid (VITAMIN C) 250 MG CHEW Chew 250 mg by mouth daily.     calcium carbonate (OS-CAL) 600 MG TABS tablet Take 600 mg by mouth daily.     cetirizine-pseudoephedrine (ZYRTEC-D) 5-120 MG per tablet Take 1 tablet by mouth once as needed for allergies.     Cholecalciferol (VITAMIN D-3 PO) Take 4,000 Units by mouth daily.     CRANBERRY PO Take by mouth.     estradiol (ESTRACE) 0.1 MG/GM vaginal cream Place 1 Applicatorful vaginally 2 (two) times a week.  Estradiol-Estriol-Progesterone (BIEST/PROGESTERONE) CREA Apply 2 clicks daily, except on Sunday. 40 g PRN   Glucosamine-Chondroitin (GLUCOSAMINE CHONDR COMPLEX PO) Take 1 tablet by mouth daily.     pantoprazole (PROTONIX) 40 MG tablet TAKE 1 TABLET BY MOUTH EVERY DAY 90 tablet 2   Pirfenidone (ESBRIET) 801 MG TABS Take 1 tablet (801 mg) by mouth with breakfast, with lunch, and with evening meal. 270 tablet 1   rosuvastatin (CRESTOR) 10 MG tablet Take 1 tablet (10 mg total) by mouth daily. 90 tablet 3   Vitamin A 2400 MCG (8000 UT) CAPS Take 2,400 mcg by mouth daily.     Vitamin E 180 MG (400 UNIT) CAPS Take 180 mg by mouth daily.     No current facility-administered medications on  file prior to visit.        ROS:  All others reviewed and negative.  Objective        PE:  BP 108/60 (BP Location: Left Arm, Patient Position: Sitting, Cuff Size: Normal)   Pulse 100   Temp 98.1 F (36.7 C) (Oral)   Ht _0  (1.6 m)   Wt 131 lb (59.4 kg)   SpO2 97%   BMI 23.21 kg/m                 Constitutional: Pt appears in NAD               HENT: Head: NCAT.                Right Ear: External ear normal.                 Left Ear: External ear normal.  Bilat tm's with mild erythema.  Max sinus areas non tender.  Pharynx with mild erythema, no exudate                 Eyes: . Pupils are equal, round, and reactive to light. Conjunctivae and EOM are normal               Nose: without d/c or deformity               Neck: Neck supple. Gross normal ROM               Cardiovascular: Normal rate and regular rhythm.                 Pulmonary/Chest: Effort normal and breath sounds decreased without wheezing but with bibasilar rales c/w known IPF.                Abd:  Soft, NT, ND, + BS, no organomegaly               Neurological: Pt is alert. At baseline orientation, motor grossly intact               Skin: Skin is warm. No rashes, no other new lesions, LE edema - none               Psychiatric: Pt behavior is normal without agitation   Micro: none  Cardiac tracings I have personally interpreted today:  none  Pertinent Radiological findings (summarize): none   Lab Results  Component Value Date   WBC 7.3 12/18/2021   HGB 12.0 12/18/2021   HCT 36.3 12/18/2021   PLT 233.0 12/18/2021   GLUCOSE 129 (H) 12/18/2021   CHOL 274 (H) 04/22/2021   TRIG 288.0 (H) 04/22/2021   HDL 45.40 04/22/2021   LDLDIRECT 194.0 04/22/2021  LDLCALC 124 (H) 08/01/2018   ALT 12 12/18/2021   AST 13 12/18/2021   NA 135 12/18/2021   K 3.7 12/18/2021   CL 100 12/18/2021   CREATININE 0.72 12/18/2021   BUN 22 12/18/2021   CO2 30 12/18/2021   TSH 1.42 04/22/2021   HGBA1C 6.0 04/22/2021   POCT testing  - COVID - neg, Flu - neg, RSV - neg  Assessment/Plan:  Donna Holmes is a 72 y.o. 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  Allergic rhinitis Stable, cont otc nasacort asd  Hyperglycemia Lab Results  Component Value Date   HGBA1C 6.0 04/22/2021   Stable, pt to continue current medical treatment  - diet, wt control   Productive cough Mild to mod, c/w bronchitis vs pna, deciens cxr for now, for antibx course levaquin 500 qd,,  to f/u any worsening symptoms or concerns  Followup: Return in about 3 weeks (around 04/21/2022).  Cathlean Cower, MD 03/31/2022 7:43 PM [facility]

## 2022-03-31 NOTE — Patient Instructions (Signed)
Your COVID, flu and RSV testing is negative  Please take all new medication as prescribed - the antibiotic, and cough medicine as needed  Please continue all other medications as before, and refills have been done if requested.  Please have the pharmacy call with any other refills you may need.  Please continue your efforts at being more active, low cholesterol diet, and weight control.  Please keep your appointments with your specialists as you may have planned  Please make an Appointment to return in Feb 7, or sooner if needed

## 2022-04-01 ENCOUNTER — Other Ambulatory Visit (HOSPITAL_COMMUNITY): Payer: Self-pay

## 2022-04-21 ENCOUNTER — Other Ambulatory Visit (HOSPITAL_COMMUNITY): Payer: Self-pay

## 2022-04-21 ENCOUNTER — Other Ambulatory Visit: Payer: Self-pay

## 2022-04-21 ENCOUNTER — Ambulatory Visit: Payer: Medicare Other | Admitting: Internal Medicine

## 2022-04-21 ENCOUNTER — Other Ambulatory Visit: Payer: Self-pay | Admitting: Pulmonary Disease

## 2022-04-21 VITALS — BP 108/62 | HR 98 | Temp 98.7°F | Ht 63.0 in | Wt 130.0 lb

## 2022-04-21 DIAGNOSIS — R739 Hyperglycemia, unspecified: Secondary | ICD-10-CM

## 2022-04-21 DIAGNOSIS — Z0001 Encounter for general adult medical examination with abnormal findings: Secondary | ICD-10-CM | POA: Diagnosis not present

## 2022-04-21 DIAGNOSIS — E559 Vitamin D deficiency, unspecified: Secondary | ICD-10-CM | POA: Diagnosis not present

## 2022-04-21 DIAGNOSIS — E538 Deficiency of other specified B group vitamins: Secondary | ICD-10-CM | POA: Diagnosis not present

## 2022-04-21 DIAGNOSIS — E7800 Pure hypercholesterolemia, unspecified: Secondary | ICD-10-CM

## 2022-04-21 DIAGNOSIS — D509 Iron deficiency anemia, unspecified: Secondary | ICD-10-CM

## 2022-04-21 DIAGNOSIS — I700 Atherosclerosis of aorta: Secondary | ICD-10-CM | POA: Diagnosis not present

## 2022-04-21 DIAGNOSIS — J84112 Idiopathic pulmonary fibrosis: Secondary | ICD-10-CM

## 2022-04-21 LAB — URINALYSIS, ROUTINE W REFLEX MICROSCOPIC
Bilirubin Urine: NEGATIVE
Hgb urine dipstick: NEGATIVE
Ketones, ur: NEGATIVE
Leukocytes,Ua: NEGATIVE
Nitrite: NEGATIVE
Specific Gravity, Urine: 1.015 (ref 1.000–1.030)
Total Protein, Urine: NEGATIVE
Urine Glucose: NEGATIVE
Urobilinogen, UA: 0.2 (ref 0.0–1.0)
pH: 7 (ref 5.0–8.0)

## 2022-04-21 LAB — LIPID PANEL
Cholesterol: 154 mg/dL (ref 0–200)
HDL: 48.5 mg/dL (ref >39.00)
NonHDL: 105.79
Total CHOL/HDL Ratio: 3
Triglycerides: 227 mg/dL — ABNORMAL HIGH (ref 0.0–149.0)
VLDL: 45.4 mg/dL — ABNORMAL HIGH (ref 0.0–40.0)

## 2022-04-21 LAB — CBC WITH DIFFERENTIAL/PLATELET
Basophils Absolute: 0.1 10*3/uL (ref 0.0–0.1)
Basophils Relative: 1.5 % (ref 0.0–3.0)
Eosinophils Absolute: 0.2 10*3/uL (ref 0.0–0.7)
Eosinophils Relative: 2.6 % (ref 0.0–5.0)
HCT: 37.5 % (ref 36.0–46.0)
Hemoglobin: 12.4 g/dL (ref 12.0–15.0)
Lymphocytes Relative: 37.6 % (ref 12.0–46.0)
Lymphs Abs: 2.8 10*3/uL (ref 0.7–4.0)
MCHC: 33.1 g/dL (ref 30.0–36.0)
MCV: 87.6 fl (ref 78.0–100.0)
Monocytes Absolute: 0.7 10*3/uL (ref 0.1–1.0)
Monocytes Relative: 9.8 % (ref 3.0–12.0)
Neutro Abs: 3.6 10*3/uL (ref 1.4–7.7)
Neutrophils Relative %: 48.5 % (ref 43.0–77.0)
Platelets: 256 10*3/uL (ref 150.0–400.0)
RBC: 4.28 Mil/uL (ref 3.87–5.11)
RDW: 14.7 % (ref 11.5–15.5)
WBC: 7.4 10*3/uL (ref 4.0–10.5)

## 2022-04-21 LAB — TSH: TSH: 2.06 u[IU]/mL (ref 0.35–5.50)

## 2022-04-21 LAB — LDL CHOLESTEROL, DIRECT: Direct LDL: 76 mg/dL

## 2022-04-21 LAB — HEPATIC FUNCTION PANEL
ALT: 11 U/L (ref 0–35)
AST: 17 U/L (ref 0–37)
Albumin: 4.8 g/dL (ref 3.5–5.2)
Alkaline Phosphatase: 63 U/L (ref 39–117)
Bilirubin, Direct: 0.1 mg/dL (ref 0.0–0.3)
Total Bilirubin: 0.3 mg/dL (ref 0.2–1.2)
Total Protein: 7.9 g/dL (ref 6.0–8.3)

## 2022-04-21 LAB — BASIC METABOLIC PANEL
BUN: 14 mg/dL (ref 6–23)
CO2: 30 mEq/L (ref 19–32)
Calcium: 10 mg/dL (ref 8.4–10.5)
Chloride: 101 mEq/L (ref 96–112)
Creatinine, Ser: 0.72 mg/dL (ref 0.40–1.20)
GFR: 82.57 mL/min (ref >60.00)
Glucose, Bld: 84 mg/dL (ref 70–99)
Potassium: 4.5 mEq/L (ref 3.5–5.1)
Sodium: 138 mEq/L (ref 135–145)

## 2022-04-21 LAB — VITAMIN D 25 HYDROXY (VIT D DEFICIENCY, FRACTURES): VITD: 50.08 ng/mL (ref 30.00–100.00)

## 2022-04-21 LAB — VITAMIN B12: Vitamin B-12: >1500 pg/mL — ABNORMAL HIGH (ref 211–911)

## 2022-04-21 LAB — HEMOGLOBIN A1C: Hgb A1c MFr Bld: 5.9 % (ref 4.6–6.5)

## 2022-04-21 MED ORDER — PIRFENIDONE 801 MG PO TABS
801.0000 mg | ORAL_TABLET | Freq: Three times a day (TID) | ORAL | 1 refills | Status: DC
  Filled 2022-04-21: qty 270, 90d supply, fill #0
  Filled 2022-07-15: qty 270, 90d supply, fill #1

## 2022-04-21 NOTE — Progress Notes (Signed)
Patient ID: Donna Holmes, female   DOB: 14-Sep-1950, 72 y.o.   MRN: [id number]         Chief Complaint:: wellness exam and hld, hyperglycemia, aortic atherosclerosis, iron def anemia       HPI:  Donna Holmes is a 72 y.o. female here for wellness exam; for Tdap and shingrix at pharmacy, declines covid booster; has mammogram for mar 2024, o/w up to date               Also has been statin intolerant in past, trying to follow lower chol diet and is taking crestor 10 mg qd,   Pt denies chest pain, increased sob or doe, wheezing, orthopnea, PND, increased LE swelling, palpitations, dizziness or syncope.   Pt denies polydipsia, polyuria, or new focal neuro s/s.    Pt denies fever, wt loss, night sweats, loss of appetite, or other constitutional symptoms     Wt Readings from Last 3 Encounters:  04/21/22 130 lb (59 kg)  03/31/22 131 lb (59.4 kg)  01/27/22 131 lb (59.4 kg)   BP Readings from Last 3 Encounters:  04/21/22 108/62  03/31/22 108/60  01/27/22 102/60   Immunization History  Administered Date(s) Administered   Fluad Quad(high Dose 65+) 12/13/2018, 01/16/2020, 02/19/2022   Influenza, High Dose Seasonal PF 01/15/2021   Influenza-Unspecified 12/02/2015, 12/21/2016, 12/28/2017   PFIZER Comirnaty(Gray Top)Covid-19 Tri-Sucrose Vaccine 07/24/2020   PFIZER(Purple Top)SARS-COV-2 Vaccination 04/22/2019, 05/17/2019, 12/23/2019   Pfizer Covid-19 Vaccine Bivalent Booster 31yr & up 03/02/2021   Pneumococcal Conjugate-13 01/29/2014   Pneumococcal Polysaccharide-23 05/06/2017   Td 05/19/2009   Unspecified SARS-COV-2 Vaccination 12/18/2021   Health Maintenance Due  Topic Date Due   DTaP/Tdap/Td (2 - Tdap) 05/20/2019   COVID-19 Vaccine (7 - 2023-24 season) 02/12/2022      Past Medical History:  Diagnosis Date   ALLERGIC RHINITIS 05/19/2009   Anemia, iron deficiency 06/09/2015   B12 deficiency 08/02/2018   Chagas' disease [id number]  Complication of anesthesia    GERD (gastroesophageal  reflux disease)    Headache(784.0) 05/19/2009   Past Surgical History:  Procedure Laterality Date   ANTERIOR AND POSTERIOR REPAIR WITH SACROSPINOUS FIXATION N/A 05/16/2014   Procedure: ANTERIOR AND POSTERIOR REPAIR WITH SACROSPINOUS LIGAMENT SUSPENSION;  Surgeon: DLuz Lex MD;  [facility];  Service: Gynecology;  Laterality: N/A;   BARTHOLIN CYST MARSUPIALIZATION     BUNIONECTOMY Right    LAPAROSCOPIC ASSISTED VAGINAL HYSTERECTOMY N/A 05/16/2014   Procedure: LAPAROSCOPIC ASSISTED VAGINAL HYSTERECTOMY;  Surgeon: DLuz Lex MD;  [facility];  Service: Gynecology;  Laterality: N/A;   NEUROMA SURGERY Right    2nd interspace    reports that she has never smoked. She has never used smokeless tobacco. She reports that she does not drink alcohol and does not use drugs. family history includes Alcohol abuse in her father; Hypertension in her mother; Prostate cancer in her maternal uncle. Allergies  Allergen Reactions   Aspirin    Codeine Nausea And Vomiting   Oxycontin [Oxycodone Hcl] Nausea Only and Other (See Comments)    FELT LIKE I WAS GOING TO FAINT   Statins Other (See Comments)    myalgias   Sulfa Antibiotics Nausea And Vomiting   Ibuprofen Other (See Comments)    Other reaction(s): GI Bleed   Current Outpatient Medications on File Prior to Visit  Medication Sig Dispense Refill   acetaminophen (TYLENOL) 500 MG tablet Take 500 mg by mouth every 6 (six) hours as needed.  albuterol (VENTOLIN HFA) 108 (90 Base) MCG/ACT inhaler TAKE 2 PUFFS BY MOUTH EVERY 6 HOURS AS NEEDED FOR WHEEZE OR SHORTNESS OF BREATH 8.5 each 5   Ascorbic Acid (VITAMIN C) 250 MG CHEW Chew 250 mg by mouth daily.     calcium carbonate (OS-CAL) 600 MG TABS tablet Take 600 mg by mouth daily.     cetirizine-pseudoephedrine (ZYRTEC-D) 5-120 MG per tablet Take 1 tablet by mouth once as needed for allergies.     Cholecalciferol (VITAMIN D-3 PO) Take 4,000 Units by mouth daily.     CRANBERRY PO Take by mouth.      estradiol (ESTRACE) 0.1 MG/GM vaginal cream Place 1 Applicatorful vaginally 2 (two) times a week.     Estradiol-Estriol-Progesterone (BIEST/PROGESTERONE) CREA Apply 2 clicks daily, except on Sunday. 40 g PRN   Glucosamine-Chondroitin (GLUCOSAMINE CHONDR COMPLEX PO) Take 1 tablet by mouth daily.     pantoprazole (PROTONIX) 40 MG tablet TAKE 1 TABLET BY MOUTH EVERY DAY 90 tablet 3   rosuvastatin (CRESTOR) 10 MG tablet Take 1 tablet (10 mg total) by mouth daily. 90 tablet 3   Vitamin A 2400 MCG (8000 UT) CAPS Take 2,400 mcg by mouth daily.     Vitamin E 180 MG (400 UNIT) CAPS Take 180 mg by mouth daily.     No current facility-administered medications on file prior to visit.        ROS:  All others reviewed and negative.  Objective        PE:  BP 108/62   Pulse 98   Temp 98.7 F (37.1 C) (Temporal)   Ht 5' 3"$  (1.6 m)   Wt 130 lb (59 kg)   SpO2 98%   BMI 23.03 kg/m                 Constitutional: Pt appears in NAD               HENT: Head: NCAT.                Right Ear: External ear normal.                 Left Ear: External ear normal.                Eyes: . Pupils are equal, round, and reactive to light. Conjunctivae and EOM are normal               Nose: without d/c or deformity               Neck: Neck supple. Gross normal ROM               Cardiovascular: Normal rate and regular rhythm.                 Pulmonary/Chest: Effort normal and breath sounds without rales or wheezing.                Abd:  Soft, NT, ND, + BS, no organomegaly               Neurological: Pt is alert. At baseline orientation, motor grossly intact               Skin: Skin is warm. No rashes, no other new lesions, LE edema - none               Psychiatric: Pt behavior is normal without agitation   Micro: none  Cardiac tracings I have personally interpreted today:  none  Pertinent Radiological findings (summarize): none   Lab Results  Component Value Date   WBC 7.4 04/21/2022   HGB 12.4  04/21/2022   HCT 37.5 04/21/2022   PLT 256.0 04/21/2022   GLUCOSE 84 04/21/2022   CHOL 154 04/21/2022   TRIG 227.0 (H) 04/21/2022   HDL 48.50 04/21/2022   LDLDIRECT 76.0 04/21/2022   LDLCALC 124 (H) 08/01/2018   ALT 11 04/21/2022   AST 17 04/21/2022   NA 138 04/21/2022   K 4.5 04/21/2022   CL 101 04/21/2022   CREATININE 0.72 04/21/2022   BUN 14 04/21/2022   CO2 30 04/21/2022   TSH 2.06 04/21/2022   HGBA1C 5.9 04/21/2022   Assessment/Plan:  Donna Holmes is a 72 y.o. 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 I have personally reviewed and have noted: 1) the patient's medical and social history 2) The patient's current medications and supplements 3) The patient's height, weight, and BMI have been recorded in the chart   Hyperlipidemia Uncontrolled, tolerating crestor 10 mg, goal ldl < 70 but declines increase due to wary of side effect,, pt to continue current statin crestor 10 mg  Lab Results  Component Value Date   CHOL 154 04/21/2022   HDL 48.50 04/21/2022   LDLCALC 124 (H) 08/01/2018   LDLDIRECT 76.0 04/21/2022   TRIG 227.0 (H) 04/21/2022   CHOLHDL 3 04/21/2022      Aortic atherosclerosis (HCC) Pt for lower chol diet, crestor 10 mg and  excercise  Anemia, iron deficiency No recent overt bleeding, for f/u iron with lab today  Hyperglycemia Lab Results  Component Value Date   HGBA1C 5.9 04/21/2022   Stable, pt to continue current medical treatment  - diet, wt control  Followup: Return in about 6 months (around 10/20/2022).  Cathlean Cower, MD 04/24/2022 4:16 PM [facility]

## 2022-04-21 NOTE — Patient Instructions (Signed)
Please have the Tdap tetanus shot done at your pharmacy  Please continue all other medications as before, and refills have been done if requested.  Please have the pharmacy call with any other refills you may need.  Please continue your efforts at being more active, low cholesterol diet, and weight control.  You are otherwise up to date with prevention measures today.  Please keep your appointments with your specialists as you may have planned  Please go to the LAB at the blood drawing area for the tests to be done  You will be contacted by phone if any changes need to be made immediately.  Otherwise, you will receive a letter about your results with an explanation, but please check with MyChart first.  Please remember to sign up for MyChart if you have not done so, as this will be important to you in the future with finding out test results, communicating by private email, and scheduling acute appointments online when needed.  Please make an Appointment to return in 6 months, or sooner if needed

## 2022-04-22 ENCOUNTER — Other Ambulatory Visit (HOSPITAL_COMMUNITY): Payer: Self-pay

## 2022-04-24 ENCOUNTER — Encounter: Payer: Self-pay | Admitting: Internal Medicine

## 2022-04-24 NOTE — Assessment & Plan Note (Signed)
Age and sex appropriate education and counseling updated with regular exercise and diet Referrals for preventative services - for mammogram mar 2024 Immunizations addressed - for tdap and shingrix at pharmacy, declines covid booster Smoking counseling  - none needed Evidence for depression or other mood disorder - none significant Most recent labs reviewed. I have personally reviewed and have noted: 1) the patient's medical and social history 2) The patient's current medications and supplements 3) The patient's height, weight, and BMI have been recorded in the chart

## 2022-04-24 NOTE — Assessment & Plan Note (Addendum)
Unable to tolerate statin, for lower chol diet

## 2022-04-24 NOTE — Assessment & Plan Note (Signed)
Pt for lower chol diet, crestor 10 mg and excercise

## 2022-04-24 NOTE — Assessment & Plan Note (Signed)
Lab Results  Component Value Date   HGBA1C 5.9 04/21/2022   Stable, pt to continue current medical treatment  - diet, wt control

## 2022-04-24 NOTE — Assessment & Plan Note (Signed)
Uncontrolled, tolerating crestor 10 mg, goal ldl < 70 but declines increase due to wary of side effect,, pt to continue current statin crestor 10 mg  Lab Results  Component Value Date   CHOL 154 04/21/2022   HDL 48.50 04/21/2022   LDLCALC 124 (H) 08/01/2018   LDLDIRECT 76.0 04/21/2022   TRIG 227.0 (H) 04/21/2022   CHOLHDL 3 04/21/2022

## 2022-04-24 NOTE — Assessment & Plan Note (Signed)
No recent overt bleeding, for f/u iron with lab today

## 2022-04-28 ENCOUNTER — Other Ambulatory Visit: Payer: Self-pay

## 2022-05-13 ENCOUNTER — Encounter: Payer: Self-pay | Admitting: Internal Medicine

## 2022-05-13 ENCOUNTER — Ambulatory Visit: Payer: Medicare Other

## 2022-05-13 ENCOUNTER — Ambulatory Visit: Payer: Medicare Other | Admitting: Internal Medicine

## 2022-05-13 VITALS — BP 110/64 | HR 60 | Temp 98.1°F | Ht 63.0 in | Wt 129.8 lb

## 2022-05-13 DIAGNOSIS — Z2821 Immunization not carried out because of patient refusal: Secondary | ICD-10-CM | POA: Diagnosis not present

## 2022-05-13 DIAGNOSIS — N951 Menopausal and female climacteric states: Secondary | ICD-10-CM

## 2022-05-13 DIAGNOSIS — N952 Postmenopausal atrophic vaginitis: Secondary | ICD-10-CM | POA: Diagnosis not present

## 2022-05-13 DIAGNOSIS — Z79890 Hormone replacement therapy (postmenopausal): Secondary | ICD-10-CM

## 2022-05-13 NOTE — Patient Instructions (Signed)
Health Maintenance for Postmenopausal Women Menopause is a normal process in which your ability to get pregnant comes to an end. This process happens slowly over many months or years, usually between the ages of 48 and 55. Menopause is complete when you have missed your menstrual period for 12 months. It is important to talk with your health care provider about some of the most common conditions that affect women after menopause (postmenopausal women). These include heart disease, cancer, and bone loss (osteoporosis). Adopting a healthy lifestyle and getting preventive care can help to promote your health and wellness. The actions you take can also lower your chances of developing some of these common conditions. What are the signs and symptoms of menopause? During menopause, you may have the following symptoms: Hot flashes. These can be moderate or severe. Night sweats. Decrease in sex drive. Mood swings. Headaches. Tiredness (fatigue). Irritability. Memory problems. Problems falling asleep or staying asleep. Talk with your health care provider about treatment options for your symptoms. Do I need hormone replacement therapy? Hormone replacement therapy is effective in treating symptoms that are caused by menopause, such as hot flashes and night sweats. Hormone replacement carries certain risks, especially as you become older. If you are thinking about using estrogen or estrogen with progestin, discuss the benefits and risks with your health care provider. How can I reduce my risk for heart disease and stroke? The risk of heart disease, heart attack, and stroke increases as you age. One of the causes may be a change in the body's hormones during menopause. This can affect how your body uses dietary fats, triglycerides, and cholesterol. Heart attack and stroke are medical emergencies. There are many things that you can do to help prevent heart disease and stroke. Watch your blood pressure High  blood pressure causes heart disease and increases the risk of stroke. This is more likely to develop in people who have high blood pressure readings or are overweight. Have your blood pressure checked: Every 3-5 years if you are 18-39 years of age. Every year if you are 40 years old or older. Eat a healthy diet  Eat a diet that includes plenty of vegetables, fruits, low-fat dairy products, and lean protein. Do not eat a lot of foods that are high in solid fats, added sugars, or sodium. Get regular exercise Get regular exercise. This is one of the most important things you can do for your health. Most adults should: Try to exercise for at least 150 minutes each week. The exercise should increase your heart rate and make you sweat (moderate-intensity exercise). Try to do strengthening exercises at least twice each week. Do these in addition to the moderate-intensity exercise. Spend less time sitting. Even light physical activity can be beneficial. Other tips Work with your health care provider to achieve or maintain a healthy weight. Do not use any products that contain nicotine or tobacco. These products include cigarettes, chewing tobacco, and vaping devices, such as e-cigarettes. If you need help quitting, ask your health care provider. Know your numbers. Ask your health care provider to check your cholesterol and your blood sugar (glucose). Continue to have your blood tested as directed by your health care provider. Do I need screening for cancer? Depending on your health history and family history, you may need to have cancer screenings at different stages of your life. This may include screening for: Breast cancer. Cervical cancer. Lung cancer. Colorectal cancer. What is my risk for osteoporosis? After menopause, you may be   at increased risk for osteoporosis. Osteoporosis is a condition in which bone destruction happens more quickly than new bone creation. To help prevent osteoporosis or  the bone fractures that can happen because of osteoporosis, you may take the following actions: If you are 19-50 years old, get at least 1,000 mg of calcium and at least 600 international units (IU) of vitamin D per day. If you are older than age 50 but younger than age 70, get at least 1,200 mg of calcium and at least 600 international units (IU) of vitamin D per day. If you are older than age 70, get at least 1,200 mg of calcium and at least 800 international units (IU) of vitamin D per day. Smoking and drinking excessive alcohol increase the risk of osteoporosis. Eat foods that are rich in calcium and vitamin D, and do weight-bearing exercises several times each week as directed by your health care provider. How does menopause affect my mental health? Depression may occur at any age, but it is more common as you become older. Common symptoms of depression include: Feeling depressed. Changes in sleep patterns. Changes in appetite or eating patterns. Feeling an overall lack of motivation or enjoyment of activities that you previously enjoyed. Frequent crying spells. Talk with your health care provider if you think that you are experiencing any of these symptoms. General instructions See your health care provider for regular wellness exams and vaccines. This may include: Scheduling regular health, dental, and eye exams. Getting and maintaining your vaccines. These include: Influenza vaccine. Get this vaccine each year before the flu season begins. Pneumonia vaccine. Shingles vaccine. Tetanus, diphtheria, and pertussis (Tdap) booster vaccine. Your health care provider may also recommend other immunizations. Tell your health care provider if you have ever been abused or do not feel safe at home. Summary Menopause is a normal process in which your ability to get pregnant comes to an end. This condition causes hot flashes, night sweats, decreased interest in sex, mood swings, headaches, or lack  of sleep. Treatment for this condition may include hormone replacement therapy. Take actions to keep yourself healthy, including exercising regularly, eating a healthy diet, watching your weight, and checking your blood pressure and blood sugar levels. Get screened for cancer and depression. Make sure that you are up to date with all your vaccines. This information is not intended to replace advice given to you by your health care provider. Make sure you discuss any questions you have with your health care provider. Document Revised: 07/21/2020 Document Reviewed: 07/21/2020 Elsevier Patient Education  2023 Elsevier Inc.  

## 2022-05-13 NOTE — Progress Notes (Signed)
Barnet Glasgow Martin,acting as a Education administrator for Maximino Greenland, MD.,have documented all relevant documentation on the behalf of Maximino Greenland, MD,as directed by  Maximino Greenland, MD while in the presence of Maximino Greenland, MD.    Subjective:     Patient ID: Donna Holmes , female    DOB: June 12, 1950 , 72 y.o.   MRN: [id number]   Chief Complaint  Patient presents with   BHRT    HPI  The patient is here today for a follow-up on her hormones. She feels well on her current regimen. She has no specific concerns or complaints at this time. Patient states she will get tdap at pharmacy.  BP Readings from Last 3 Encounters: 05/13/22 : 110/64 04/21/22 : 108/62 03/31/22 : 108/60       Past Medical History:  Diagnosis Date   ALLERGIC RHINITIS 05/19/2009   Anemia, iron deficiency 06/09/2015   B12 deficiency 08/02/2018   Chagas' disease [id number]   Complication of anesthesia    GERD (gastroesophageal reflux disease)    Headache(784.0) 05/19/2009     Family History  Problem Relation Age of Onset   Alcohol abuse Father    Hypertension Mother    Prostate cancer Maternal Uncle      Current Outpatient Medications:    acetaminophen (TYLENOL) 500 MG tablet, Take 500 mg by mouth every 6 (six) hours as needed., Disp: , Rfl:    albuterol (VENTOLIN HFA) 108 (90 Base) MCG/ACT inhaler, TAKE 2 PUFFS BY MOUTH EVERY 6 HOURS AS NEEDED FOR WHEEZE OR SHORTNESS OF BREATH, Disp: 8.5 each, Rfl: 5   calcium carbonate (OS-CAL) 600 MG TABS tablet, Take 600 mg by mouth daily., Disp: , Rfl:    cetirizine-pseudoephedrine (ZYRTEC-D) 5-120 MG per tablet, Take 1 tablet by mouth once as needed for allergies., Disp: , Rfl:    Cholecalciferol (VITAMIN D-3 PO), Take 4,000 Units by mouth daily., Disp: , Rfl:    CRANBERRY PO, Take by mouth., Disp: , Rfl:    estradiol (ESTRACE) 0.1 MG/GM vaginal cream, Place 1 Applicatorful vaginally 2 (two) times a week., Disp: , Rfl:    Estradiol-Estriol-Progesterone  (BIEST/PROGESTERONE) CREA, Apply 2 clicks daily, except on Sunday., Disp: 40 g, Rfl: PRN   Glucosamine-Chondroitin (GLUCOSAMINE CHONDR COMPLEX PO), Take 1 tablet by mouth daily., Disp: , Rfl:    pantoprazole (PROTONIX) 40 MG tablet, TAKE 1 TABLET BY MOUTH EVERY DAY, Disp: 90 tablet, Rfl: 3   Pirfenidone (ESBRIET) 801 MG TABS, Take 1 tablet (801 mg) by mouth with breakfast, with lunch, and with evening meal., Disp: 270 tablet, Rfl: 1   rosuvastatin (CRESTOR) 10 MG tablet, Take 1 tablet (10 mg total) by mouth daily., Disp: 90 tablet, Rfl: 3   Allergies  Allergen Reactions   Aspirin    Codeine Nausea And Vomiting   Oxycontin [Oxycodone Hcl] Nausea Only and Other (See Comments)    FELT LIKE I WAS GOING TO FAINT   Statins Other (See Comments)    myalgias   Sulfa Antibiotics Nausea And Vomiting   Ibuprofen Other (See Comments)    Other reaction(s): GI Bleed     Review of Systems  Constitutional: Negative.   Respiratory: Negative.    Cardiovascular: Negative.   Neurological: Negative.   Psychiatric/Behavioral: Negative.       Today's Vitals   05/13/22 0836  BP: 110/64  Pulse: 60  Temp: 98.1 F (36.7 C)  TempSrc: Oral  Weight: 129 lb 12.8 oz (58.9 kg)  Height: '5\' 3"'$  (1.6  m)  PainSc: 0-No pain   Body mass index is 22.99 kg/m.  Wt Readings from Last 3 Encounters:  05/13/22 129 lb 12.8 oz (58.9 kg)  04/21/22 130 lb (59 kg)  03/31/22 131 lb (59.4 kg)    Objective:  Physical Exam Vitals and nursing note reviewed.  Constitutional:      Appearance: Normal appearance.  HENT:     Head: Normocephalic and atraumatic.     Nose:     Comments: Masked     Mouth/Throat:     Comments: Masked  Eyes:     Extraocular Movements: Extraocular movements intact.  Cardiovascular:     Rate and Rhythm: Normal rate and regular rhythm.     Heart sounds: Normal heart sounds.  Pulmonary:     Effort: Pulmonary effort is normal.     Breath sounds: Normal breath sounds.  Musculoskeletal:      Cervical back: Normal range of motion.  Skin:    General: Skin is warm.  Neurological:     General: No focal deficit present.     Mental Status: She is alert.  Psychiatric:        Mood and Affect: Mood normal.        Behavior: Behavior normal.         Assessment And Plan:     1. Symptomatic menopausal or female climacteric states Comments: I will check serum estradiol levels today. I will wean Bi-est down to 1 click MWF until she runs out. She will c/w vaginal Estrace as per another provider. She is in agreement with treatment plan. She will c/w progesterone/testosterone supplementation. Will decrease progesterone dosage w/ next refill.  - Estradiol - Progesterone - Testosterone, Total  2. Atrophic vaginitis Controlled with vaginal Estrace.   3. Need for postmenopausal hormone replacement  4. Immunization declined   Patient was given opportunity to ask questions. Patient verbalized understanding of the plan and was able to repeat key elements of the plan. All questions were answered to their satisfaction.   I, Maximino Greenland, MD, have reviewed all documentation for this visit. The documentation on 05/13/22 for the exam, diagnosis, procedures, and orders are all accurate and complete.   IF YOU HAVE BEEN REFERRED TO A SPECIALIST, IT MAY TAKE 1-2 WEEKS TO SCHEDULE/PROCESS THE REFERRAL. IF YOU HAVE NOT HEARD FROM US/SPECIALIST IN TWO WEEKS, PLEASE GIVE [place] A CALL AT [phone] X [phone].   THE PATIENT IS ENCOURAGED TO PRACTICE SOCIAL DISTANCING DUE TO THE COVID-19 PANDEMIC.

## 2022-05-14 LAB — TESTOSTERONE: Testosterone: 7 ng/dL (ref 3–67)

## 2022-05-14 LAB — ESTRADIOL: Estradiol: <5 pg/mL (ref 0.0–54.7)

## 2022-05-14 LAB — PROGESTERONE: Progesterone: 0.6 ng/mL

## 2022-05-17 ENCOUNTER — Ambulatory Visit
Admission: RE | Admit: 2022-05-17 | Discharge: 2022-05-17 | Disposition: A | Discharge status: Home / Self Care | Payer: Medicare Other | Source: Ambulatory Visit | Attending: Obstetrics and Gynecology | Admitting: Obstetrics and Gynecology

## 2022-05-17 DIAGNOSIS — Z1231 Encounter for screening mammogram for malignant neoplasm of breast: Secondary | ICD-10-CM

## 2022-07-06 ENCOUNTER — Telehealth: Payer: Self-pay

## 2022-07-06 NOTE — Telephone Encounter (Signed)
Called patient to schedule Medicare Annual Wellness Visit (AWV). Left message for patient to call back and schedule Medicare Annual Wellness Visit (AWV).  Last date of AWV: 05/08/18  Please schedule an appointment at any time on Annual Wellness Visit Schedule.

## 2022-07-15 ENCOUNTER — Other Ambulatory Visit (HOSPITAL_COMMUNITY): Payer: Self-pay

## 2022-07-21 ENCOUNTER — Other Ambulatory Visit: Payer: Self-pay | Admitting: Internal Medicine

## 2022-07-23 ENCOUNTER — Other Ambulatory Visit: Payer: Self-pay

## 2022-07-29 ENCOUNTER — Other Ambulatory Visit (HOSPITAL_COMMUNITY): Payer: Self-pay

## 2022-09-14 ENCOUNTER — Ambulatory Visit: Payer: Medicare Other | Admitting: Internal Medicine

## 2022-10-20 ENCOUNTER — Ambulatory Visit: Payer: Medicare Other | Admitting: Internal Medicine

## 2022-10-20 VITALS — BP 98/66 | HR 87 | Temp 98.1°F | Ht 63.0 in | Wt 126.2 lb

## 2022-10-20 DIAGNOSIS — E538 Deficiency of other specified B group vitamins: Secondary | ICD-10-CM | POA: Diagnosis not present

## 2022-10-20 DIAGNOSIS — G720 Drug-induced myopathy: Secondary | ICD-10-CM | POA: Diagnosis not present

## 2022-10-20 DIAGNOSIS — I700 Atherosclerosis of aorta: Secondary | ICD-10-CM

## 2022-10-20 DIAGNOSIS — E559 Vitamin D deficiency, unspecified: Secondary | ICD-10-CM

## 2022-10-20 DIAGNOSIS — T466X5A Adverse effect of antihyperlipidemic and antiarteriosclerotic drugs, initial encounter: Secondary | ICD-10-CM | POA: Diagnosis not present

## 2022-10-20 DIAGNOSIS — E7800 Pure hypercholesterolemia, unspecified: Secondary | ICD-10-CM | POA: Diagnosis not present

## 2022-10-20 DIAGNOSIS — R739 Hyperglycemia, unspecified: Secondary | ICD-10-CM

## 2022-10-20 NOTE — Progress Notes (Unsigned)
Patient ID: Donna Holmes, female   DOB: 1951-03-11, 72 y.o.   MRN: [id number]        Chief Complaint: follow up hld, low vit b12, hyperglycemia,        HPI:  Donna Holmes is a 72 y.o. female here overall doing ok.  Has been off crestor, trying to follow lower cholesterol diet.  Pt denies chest pain, increased sob or doe, wheezing, orthopnea, PND, increased LE swelling, palpitations, dizziness or syncope.   Pt denies polydipsia, polyuria, or new focal neuro s/s.    Pt denies fever, wt loss, night sweats, loss of appetite, or other constitutional symptoms  Taking b12 once daily.  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   Complication of anesthesia    GERD (gastroesophageal reflux disease)    Headache(784.0) 05/19/2009   Past Surgical History:  Procedure Laterality Date   ANTERIOR AND POSTERIOR REPAIR WITH SACROSPINOUS FIXATION N/A 05/16/2014   Procedure: ANTERIOR AND POSTERIOR REPAIR WITH SACROSPINOUS LIGAMENT SUSPENSION;  Surgeon: Turner Daniels, MD;  Location: [facility];  Service: Gynecology;  Laterality: N/A;   BARTHOLIN CYST MARSUPIALIZATION     BUNIONECTOMY Right    LAPAROSCOPIC ASSISTED VAGINAL HYSTERECTOMY N/A 05/16/2014   Procedure: LAPAROSCOPIC ASSISTED VAGINAL HYSTERECTOMY;  Surgeon: Turner Daniels, MD;  Location: [facility];  Service: Gynecology;  Laterality: N/A;   NEUROMA SURGERY Right    2nd interspace    reports that she has never smoked. She has never used smokeless tobacco. She reports that she does not drink alcohol and does not use drugs. family history includes Alcohol abuse  in her father; Hypertension in her mother; Prostate cancer in her maternal uncle. Allergies  Allergen Reactions   Aspirin    Codeine Nausea And Vomiting   Crestor [Rosuvastatin] Other (See Comments)    Muslce pain   Oxycontin [Oxycodone Hcl] Nausea Only and Other (See Comments)    FELT LIKE I WAS GOING TO FAINT   Statins Other (See Comments)    myalgias   Sulfa Antibiotics Nausea And Vomiting   Ibuprofen Other (See Comments)    Other reaction(s): GI Bleed   Current Outpatient Medications on File Prior to Visit  Medication Sig Dispense Refill   acetaminophen (TYLENOL) 500 MG tablet Take 500 mg by mouth every 6 (six) hours as needed.     albuterol (VENTOLIN HFA) 108 (90 Base) MCG/ACT inhaler TAKE 2 PUFFS BY MOUTH EVERY 6 HOURS AS NEEDED FOR WHEEZE OR SHORTNESS OF BREATH 8.5 each 5   calcium carbonate (OS-CAL) 600 MG TABS tablet Take 600 mg by mouth daily.     cetirizine-pseudoephedrine (ZYRTEC-D) 5-120 MG per tablet Take 1 tablet by mouth once as needed for allergies.     Cholecalciferol (VITAMIN D-3 PO) Take 4,000 Units by mouth daily.     CRANBERRY PO Take by mouth.     estradiol (ESTRACE) 0.1 MG/GM vaginal cream Place 1 Applicatorful vaginally 2 (two) times a week.     Estradiol-Estriol-Progesterone (BIEST/PROGESTERONE) CREA Apply 2 clicks  daily, except on Sunday. 40 g PRN   Glucosamine-Chondroitin (GLUCOSAMINE CHONDR COMPLEX PO) Take 1 tablet by mouth daily.     pantoprazole (PROTONIX) 40 MG tablet TAKE 1 TABLET BY MOUTH EVERY DAY 90 tablet 3   Pirfenidone (ESBRIET) 801 MG TABS Take 1 tablet (801 mg) by mouth with breakfast, with lunch, and with evening meal. 270 tablet 1   No current facility-administered medications on file prior to visit.        ROS:  All others reviewed and negative.  Objective        PE:  BP 98/66   Pulse 87   Temp 98.1 F (36.7 C) (Temporal)   Ht 5\' 3"  (1.6 m)   Wt 126 lb 4 oz (57.3 kg)   SpO2 97%   BMI 22.36 kg/m                 Constitutional:  Pt appears in NAD               HENT: Head: NCAT.                Right Ear: External ear normal.                 Left Ear: External ear normal.                Eyes: . Pupils are equal, round, and reactive to light. Conjunctivae and EOM are normal               Nose: without d/c or deformity               Neck: Neck supple. Gross normal ROM               Cardiovascular: Normal rate and regular rhythm.                 Pulmonary/Chest: Effort normal and breath sounds without rales or wheezing.                Abd:  Soft, NT, ND, + BS, no organomegaly               Neurological: Pt is alert. At baseline orientation, motor grossly intact               Skin: Skin is warm. No rashes, no other new lesions, LE edema - none               Psychiatric: Pt behavior is normal without agitation   Micro: none  Cardiac tracings I have personally interpreted today:  none  Pertinent Radiological findings (summarize): none   Lab Results  Component Value Date   WBC 7.4 04/21/2022   HGB 12.4 04/21/2022   HCT 37.5 04/21/2022   PLT 256.0 04/21/2022   GLUCOSE 84 04/21/2022   CHOL 154 04/21/2022   TRIG 227.0 (H) 04/21/2022   HDL 48.50 04/21/2022   LDLDIRECT 76.0 04/21/2022   LDLCALC 124 (H) 08/01/2018   ALT 11 04/21/2022   AST 17 04/21/2022   NA 138 04/21/2022   K 4.5 04/21/2022   CL 101 04/21/2022   CREATININE 0.72 04/21/2022   BUN 14 04/21/2022   CO2 30 04/21/2022   TSH 2.06 04/21/2022   HGBA1C 5.9 04/21/2022   Assessment/Plan:  Donna Holmes is a 72 y.o. 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, ok to d/c  Hyperlipidemia Lab Results  Component Value Date   LDLCALC 124 (H) 08/01/2018   uncontrolled  pt to cont low chol diet, consider repatha but declines for now   Hyperglycemia Lab Results  Component Value Date   HGBA1C 5.9 04/21/2022   Stable, pt to continue current medical treatment  - diet, wt control  B12 deficiency Lab Results  Component Value Date   VITAMINB12 >1500 (H) 04/21/2022   Overcontrolled, to cont oral replacement - b12 1000 mcg at twice per wk only   Aortic atherosclerosis (HCC) Pt to continue low chol diet, exercise, declines statin for now  Followup: Return in about 6 months (around 04/22/2023).  Oliver Barre, MD 10/21/2022 8:44 PM [facility]

## 2022-10-20 NOTE — Patient Instructions (Signed)
Ok to reduce the B12 1000 mcg to twice per wk  Please continue all other medications as before, and refills have been done if requested.  Please have the pharmacy call with any other refills you may need.  Please continue your efforts at being more active, low cholesterol diet, and weight control.  You are otherwise up to date with prevention measures today.  Please keep your appointments with your specialists as you may have planned  Please make an Appointment to return in 6 months, or sooner if needed, also with Lab Appointment for testing done 3-5 days before at the FIRST FLOOR Lab (so this is for TWO appointments - please see the scheduling desk as you leave)

## 2022-10-21 ENCOUNTER — Encounter: Payer: Self-pay | Admitting: Internal Medicine

## 2022-10-21 DIAGNOSIS — E538 Deficiency of other specified B group vitamins: Secondary | ICD-10-CM | POA: Insufficient documentation

## 2022-10-21 NOTE — Assessment & Plan Note (Signed)
Due to crestor recently, ok to d/c

## 2022-10-21 NOTE — Assessment & Plan Note (Signed)
Pt to continue low chol diet, exercise, declines statin for now

## 2022-10-21 NOTE — Assessment & Plan Note (Signed)
Lab Results  Component Value Date   VITAMINB12 >1500 (H) 04/21/2022   Overcontrolled, to cont oral replacement - b12 1000 mcg at twice per wk only

## 2022-10-21 NOTE — Assessment & Plan Note (Signed)
Lab Results  Component Value Date   LDLCALC 124 (H) 08/01/2018   uncontrolled pt to cont low chol diet, consider repatha but declines for now

## 2022-10-21 NOTE — Assessment & Plan Note (Signed)
Lab Results  Component Value Date   HGBA1C 5.9 04/21/2022   Stable, pt to continue current medical treatment  - diet, wt control

## 2022-10-22 ENCOUNTER — Other Ambulatory Visit (HOSPITAL_COMMUNITY): Payer: Self-pay

## 2022-10-22 ENCOUNTER — Other Ambulatory Visit: Payer: Self-pay

## 2022-10-22 MED ORDER — PIRFENIDONE 801 MG PO TABS
ORAL_TABLET | ORAL | 1 refills | Status: DC
  Filled 2022-10-22: qty 90, 30d supply, fill #0
  Filled 2022-11-22: qty 90, 30d supply, fill #1

## 2022-10-26 ENCOUNTER — Other Ambulatory Visit: Payer: Self-pay

## 2022-10-29 ENCOUNTER — Other Ambulatory Visit (HOSPITAL_COMMUNITY): Payer: Self-pay

## 2022-11-02 ENCOUNTER — Other Ambulatory Visit (HOSPITAL_COMMUNITY): Payer: Self-pay

## 2022-11-06 IMAGING — MG MM DIGITAL SCREENING BILAT W/ TOMO AND CAD
8 series · 9 of 24 positions shown · non-contrast
Comparison: Previous exam(s).

CLINICAL DATA: Screening.

EXAM:
DIGITAL SCREENING BILATERAL MAMMOGRAM WITH TOMOSYNTHESIS AND CAD
TECHNIQUE: Bilateral screening digital craniocaudal and mediolateral oblique
mammograms were obtained. Bilateral screening digital breast
tomosynthesis was performed. The images were evaluated with
computer-aided detection.

[R MLO synth-2D]
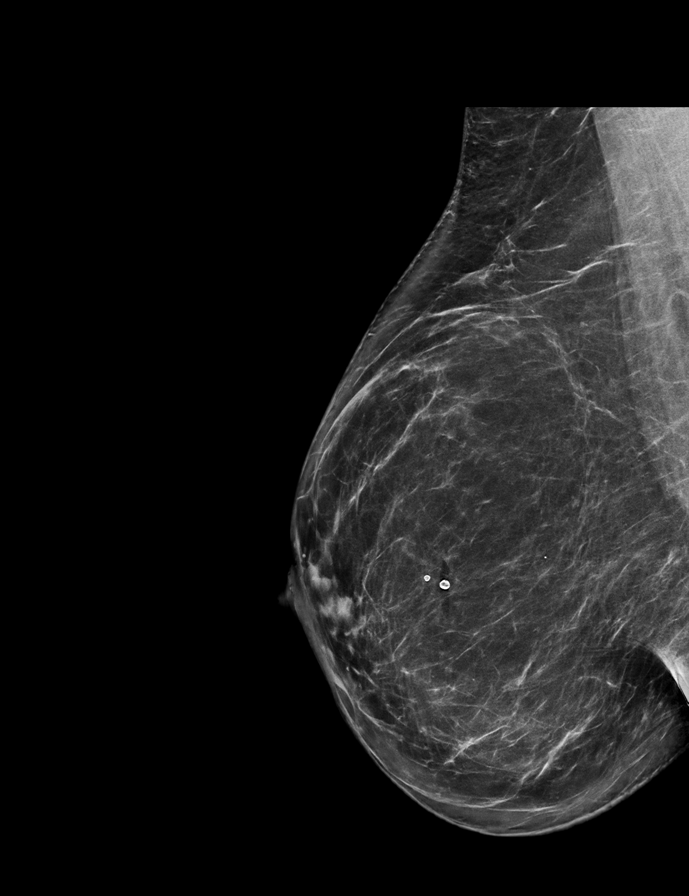

[L CC synth-2D]
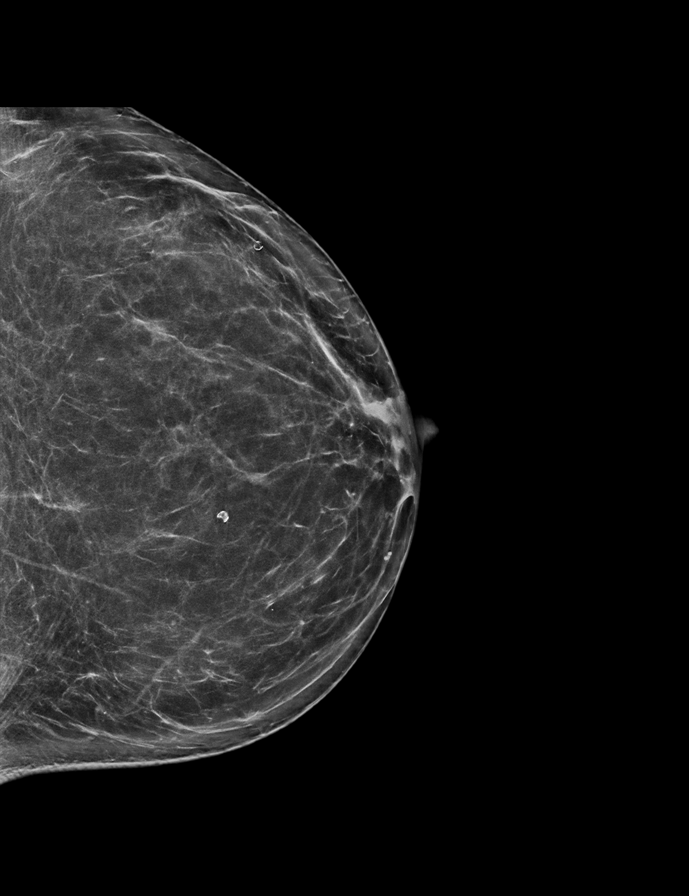

[L MLO synth-2D]
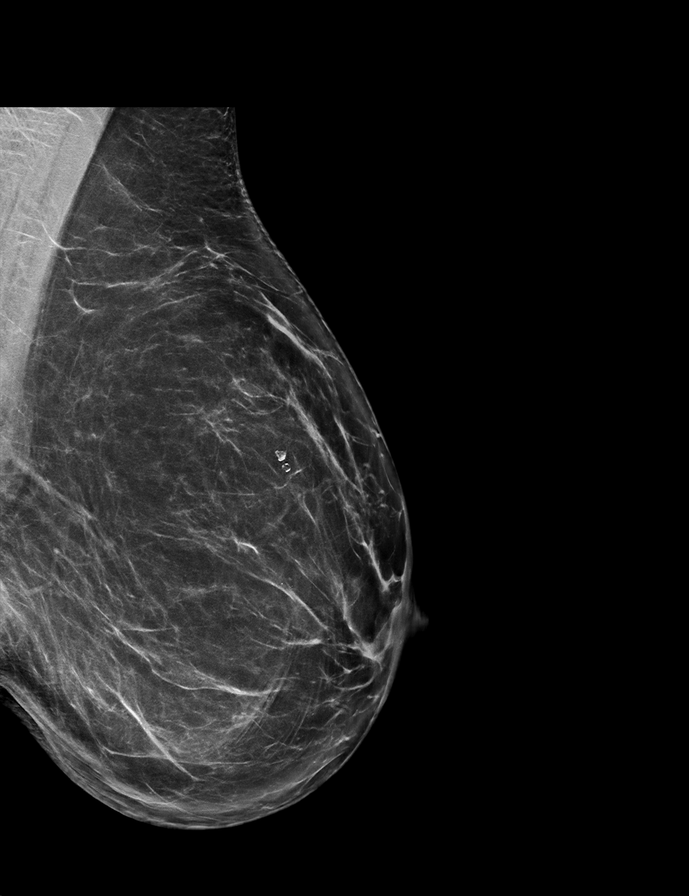

[R CC synth-2D]
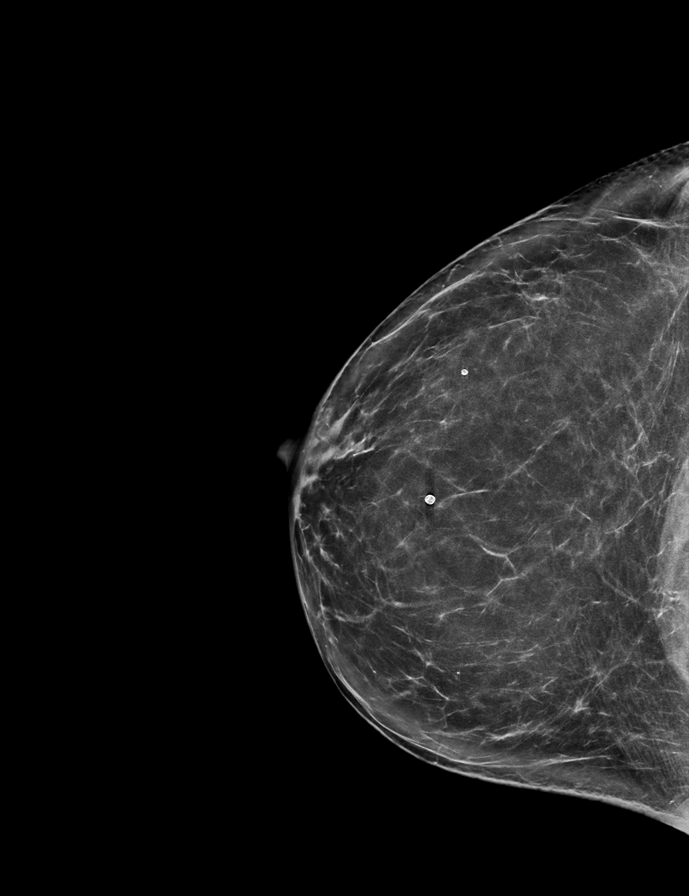

[R CC tomo · 2 of 72 frames shown]
[frame 24/72]
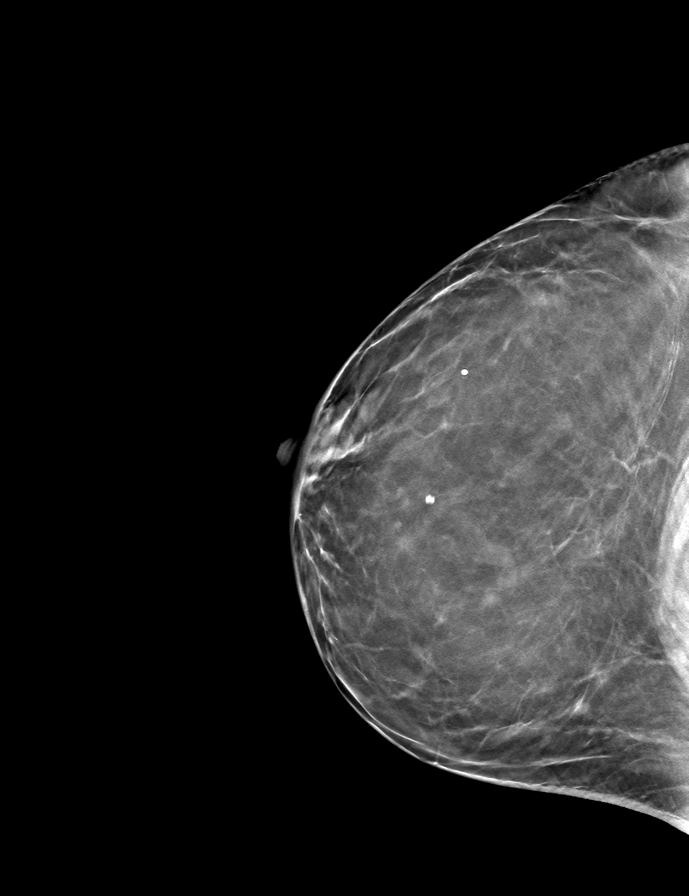
[frame 37/72]
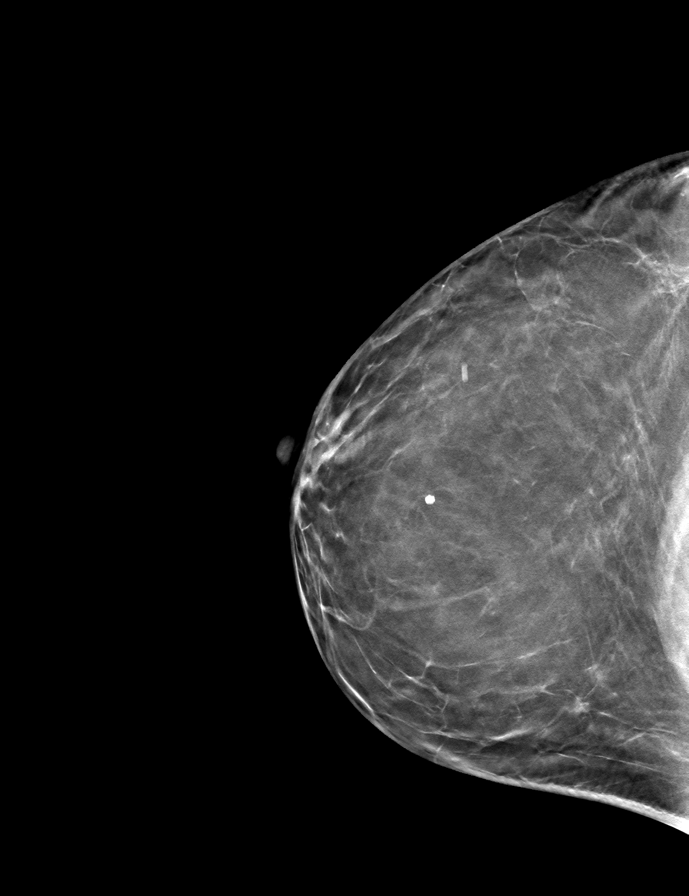

[L CC tomo · tomo slice 35/70.0]
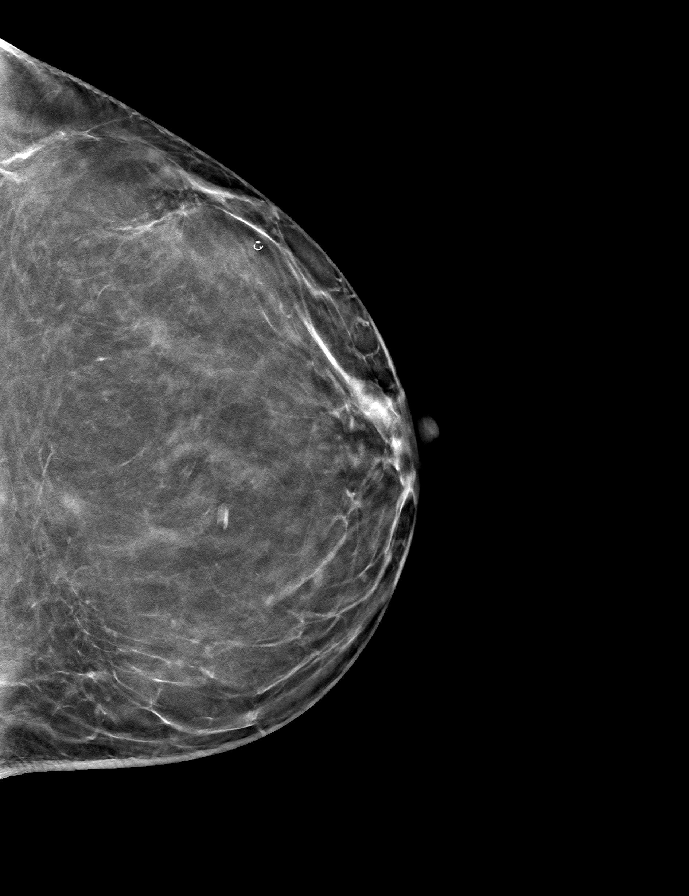

[R MLO tomo · tomo slice 37/73.0]
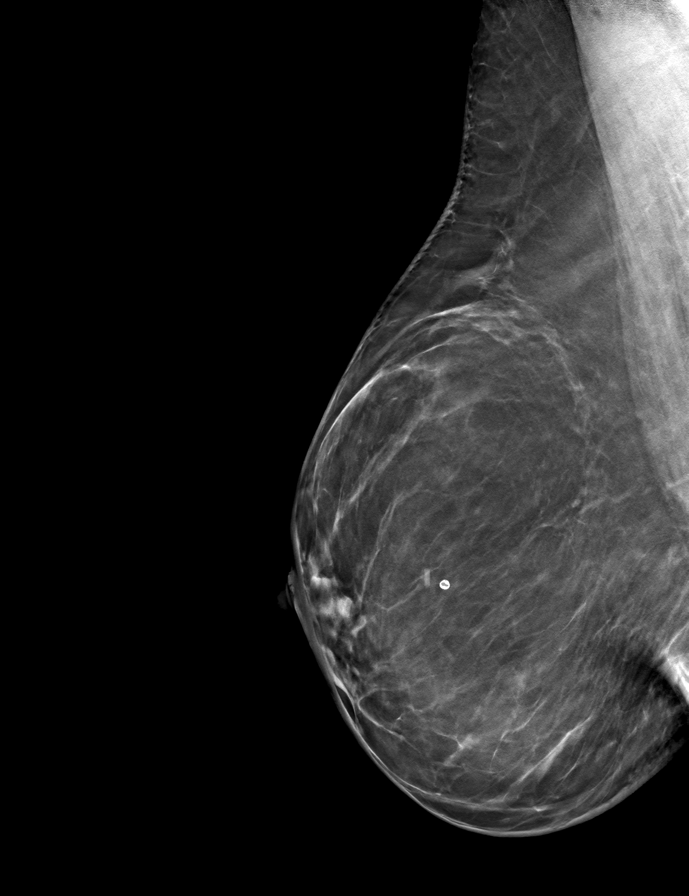

[L MLO tomo · tomo slice 41/80.0]
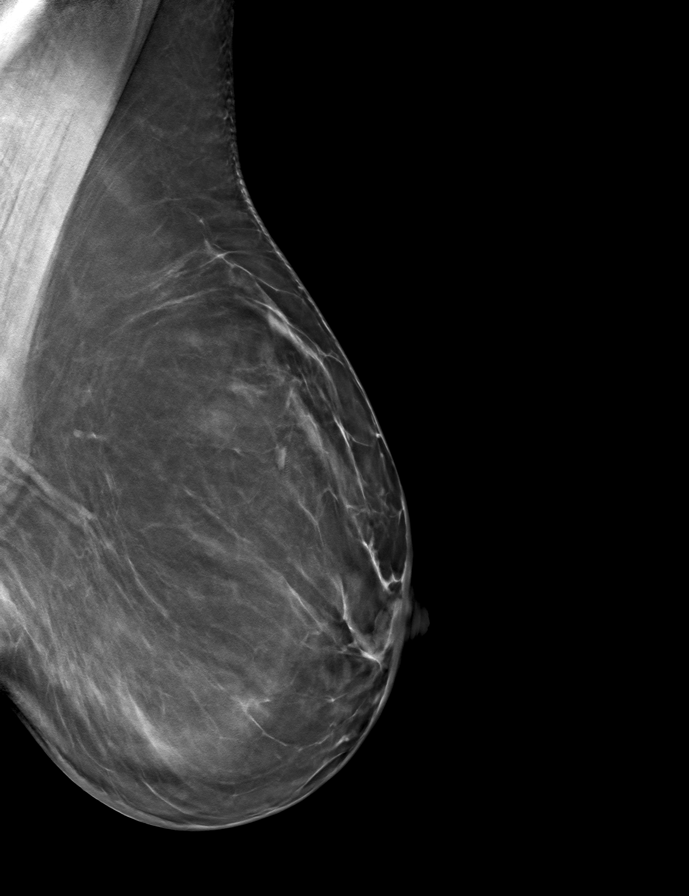

[9 of 24 positions shown; findings below may reference images not displayed]

ACR Breast Density Category b: There are scattered areas of
fibroglandular density.
FINDINGS: There are no findings suspicious for malignancy.
IMPRESSION: No mammographic evidence of malignancy. A result letter of this
screening mammogram will be mailed directly to the patient.

RECOMMENDATION:
Screening mammogram in one year. (Code:51-O-LD2)

BI-RADS CATEGORY  1: Negative.

## 2022-11-08 NOTE — Patient Instructions (Signed)

## 2022-11-09 ENCOUNTER — Ambulatory Visit: Payer: Medicare Other | Admitting: Internal Medicine

## 2022-11-09 ENCOUNTER — Encounter: Payer: Self-pay | Admitting: Internal Medicine

## 2022-11-09 VITALS — BP 110/78 | HR 98 | Temp 98.5°F | Ht 63.0 in | Wt 127.4 lb

## 2022-11-09 DIAGNOSIS — Z79899 Other long term (current) drug therapy: Secondary | ICD-10-CM | POA: Diagnosis not present

## 2022-11-09 DIAGNOSIS — Z79890 Hormone replacement therapy (postmenopausal): Secondary | ICD-10-CM

## 2022-11-09 DIAGNOSIS — N951 Menopausal and female climacteric states: Secondary | ICD-10-CM | POA: Diagnosis not present

## 2022-11-09 NOTE — Progress Notes (Signed)
I,Victoria T Deloria Lair, CMA,acting as a Neurosurgeon for Gwynneth Aliment, MD.,have documented all relevant documentation on the behalf of Gwynneth Aliment, MD,as directed by  Gwynneth Aliment, MD while in the presence of Gwynneth Aliment, MD.  Subjective:  Patient ID: Donna Holmes , female    DOB: October 13, 1950 , 72 y.o.   MRN: [id number]  Chief Complaint  Patient presents with   BHRT F/U    HPI  The patient is here today for a follow-up on her hormones. She feels well on her current regimen. She has no specific concerns or complaints at this time. Patient states she prefers to get Tdap at pharmacy.         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   Complication of anesthesia    GERD (gastroesophageal reflux disease)    Headache(784.0) 05/19/2009     Family History  Problem Relation Age of Onset   Alcohol abuse Father    Hypertension Mother    Prostate cancer Maternal Uncle      Current Outpatient Medications:    acetaminophen (TYLENOL) 500 MG tablet, Take 500 mg by mouth every 6 (six) hours as needed., Disp: , Rfl:    albuterol (VENTOLIN HFA) 108 (90 Base) MCG/ACT inhaler, TAKE 2 PUFFS BY MOUTH EVERY 6 HOURS AS NEEDED FOR WHEEZE OR SHORTNESS OF BREATH, Disp: 8.5 each, Rfl: 5   calcium carbonate (OS-CAL) 600 MG TABS tablet, Take 600 mg by mouth daily., Disp: , Rfl:    cetirizine-pseudoephedrine (ZYRTEC-D) 5-120 MG per tablet, Take 1 tablet by mouth once as needed for allergies., Disp: , Rfl:    Cholecalciferol (VITAMIN D-3 PO), Take 4,000 Units by mouth daily., Disp: , Rfl:    CRANBERRY PO, Take by mouth., Disp: , Rfl:    estradiol (ESTRACE) 0.1 MG/GM vaginal cream, Place 1 Applicatorful vaginally 2 (two) times a week., Disp: , Rfl:    Estradiol-Estriol-Progesterone (BIEST/PROGESTERONE) CREA, Apply 2 clicks daily, except on Sunday., Disp: 40 g, Rfl: PRN    pantoprazole (PROTONIX) 40 MG tablet, TAKE 1 TABLET BY MOUTH EVERY DAY, Disp: 90 tablet, Rfl: 3   Pirfenidone 801 MG TABS, Take 801 mg by mouth 3 (three) times daily, Disp: 90 tablet, Rfl: 1   Allergies  Allergen Reactions   Aspirin    Codeine Nausea And Vomiting   Crestor [Rosuvastatin] Other (See Comments)    Muslce pain   Oxycontin [Oxycodone Hcl] Nausea Only and Other (See Comments)    FELT LIKE I WAS GOING TO FAINT   Statins Other (See Comments)    myalgias   Sulfa Antibiotics Nausea And Vomiting   Ibuprofen Other (See Comments)    Other reaction(s): GI Bleed     Review of Systems  Constitutional: Negative.   Respiratory: Negative.    Cardiovascular: Negative.   Gastrointestinal: Negative.   Neurological: Negative.   Psychiatric/Behavioral: Negative.       Today's Vitals   11/09/22 1448  BP: 110/78  Pulse: 98  Temp: 98.5 F (36.9 C)  SpO2: 98%  Weight: 127 lb 6.4 oz (57.8 kg)  Height: 5\' 3"  (1.6 m)   Body mass index is 22.57 kg/m.  Wt Readings from Last 3 Encounters:  11/09/22 127 lb 6.4 oz (57.8 kg)  10/20/22 126 lb 4 oz (57.3 kg)  05/13/22 129 lb 12.8 oz (58.9 kg)     Objective:  Physical Exam Vitals and nursing note reviewed.  Constitutional:      Appearance: Normal appearance.  HENT:     Head: Normocephalic and atraumatic.  Eyes:     Extraocular Movements: Extraocular movements intact.  Cardiovascular:     Rate and Rhythm: Normal rate and regular rhythm.     Heart sounds: Normal heart sounds.  Pulmonary:     Effort: Pulmonary effort is normal.     Breath sounds: Normal breath sounds.  Musculoskeletal:     Cervical back: Normal range of motion.  Skin:    General: Skin is warm.  Neurological:     General: No focal deficit present.     Mental Status: She is alert.  Psychiatric:        Mood and Affect: Mood normal.        Behavior: Behavior normal.         Assessment And Plan:  Symptomatic menopausal or female climacteric  states Assessment & Plan: Chronic, she is currently on Biest/prog HRT used topically.  She is currently using MWF and using testosterone 1 click topically MWF.  She has not had any issues with the medication.  I will check serum testosterone level today. Labs drawn at other offices reviewed in full detail. She will f/u in four to six months.    Need for postmenopausal hormone replacement  Drug therapy -     Testosterone     Return in 6 months (on 05/12/2023), or BHRT f/u, for 6 month bhrt f/u. Marland Kitchen  Patient was given opportunity to ask questions. Patient verbalized understanding of the plan and was able to repeat key elements of the plan. All questions were answered to their satisfaction.    I, Gwynneth Aliment, MD, have reviewed all documentation for this visit. The documentation on 11/09/22 for the exam, diagnosis, procedures, and orders are all accurate and complete.  IF YOU HAVE BEEN REFERRED TO A SPECIALIST, IT MAY TAKE 1-2 WEEKS TO SCHEDULE/PROCESS THE REFERRAL. IF YOU HAVE NOT HEARD FROM US/SPECIALIST IN TWO WEEKS, PLEASE GIVE [place] A CALL AT [phone].   THE PATIENT IS ENCOURAGED TO PRACTICE SOCIAL DISTANCING DUE TO THE COVID-19 PANDEMIC.

## 2022-11-10 LAB — TESTOSTERONE: Testosterone: 8 ng/dL (ref 3–67)

## 2022-11-21 NOTE — Assessment & Plan Note (Signed)
Chronic, she is currently on Biest/prog HRT used topically.  She is currently using MWF and using testosterone 1 click topically MWF.  She has not had any issues with the medication.  I will check serum testosterone level today. Labs drawn at other offices reviewed in full detail. She will f/u in four to six months.

## 2022-11-22 ENCOUNTER — Other Ambulatory Visit (HOSPITAL_COMMUNITY): Payer: Self-pay

## 2022-12-21 ENCOUNTER — Other Ambulatory Visit (HOSPITAL_COMMUNITY): Payer: Self-pay

## 2022-12-21 ENCOUNTER — Other Ambulatory Visit: Payer: Self-pay

## 2022-12-21 MED ORDER — PIRFENIDONE 801 MG PO TABS
ORAL_TABLET | ORAL | 4 refills | Status: DC
  Filled 2022-12-21: qty 90, 30d supply, fill #0
  Filled 2023-01-25: qty 90, 30d supply, fill #1
  Filled 2023-02-24: qty 90, 30d supply, fill #2
  Filled 2023-03-23: qty 90, 30d supply, fill #3
  Filled 2023-04-20: qty 90, 30d supply, fill #4

## 2022-12-21 NOTE — Progress Notes (Signed)
Refill received

## 2022-12-21 NOTE — Progress Notes (Signed)
Specialty Pharmacy Refill Coordination Note  Meghann Naba Sneed is a 72 y.o. female contacted today regarding refills of specialty medication(s) Pirfenidone   Patient requested Delivery   Delivery date: 12/29/22   Verified address: [address] [place] [address]   Medication will be filled on 12/28/2022. Sent refill request to provider.

## 2022-12-22 ENCOUNTER — Other Ambulatory Visit: Payer: Self-pay

## 2023-01-13 ENCOUNTER — Other Ambulatory Visit (HOSPITAL_COMMUNITY): Payer: Self-pay

## 2023-01-25 ENCOUNTER — Other Ambulatory Visit (HOSPITAL_COMMUNITY): Payer: Self-pay

## 2023-01-25 ENCOUNTER — Other Ambulatory Visit (HOSPITAL_COMMUNITY): Payer: Self-pay | Admitting: Pharmacy Technician

## 2023-01-25 NOTE — Progress Notes (Signed)
Specialty Pharmacy Refill Coordination Note  Donna Holmes is a 72 y.o. female contacted today regarding refills of specialty medication(s) Pirfenidone   Patient requested Delivery   Delivery date: 01/28/23   Verified address: [address] [place]   Medication will be filled on 01/27/23.

## 2023-01-27 ENCOUNTER — Other Ambulatory Visit: Payer: Self-pay

## 2023-02-21 ENCOUNTER — Other Ambulatory Visit (HOSPITAL_COMMUNITY): Payer: Self-pay

## 2023-02-24 ENCOUNTER — Other Ambulatory Visit: Payer: Self-pay

## 2023-02-24 NOTE — Progress Notes (Signed)
Specialty Pharmacy Refill Coordination Note  Donna Holmes is a 72 y.o. female contacted today regarding refills of specialty medication(s) Pirfenidone   Patient requested No data recorded  Delivery date: 02/28/23   Verified address: [address] [place]   Medication will be filled on 02/25/23.

## 2023-03-15 ENCOUNTER — Other Ambulatory Visit: Payer: Self-pay | Admitting: Internal Medicine

## 2023-03-15 ENCOUNTER — Other Ambulatory Visit: Payer: Self-pay

## 2023-03-23 ENCOUNTER — Other Ambulatory Visit: Payer: Self-pay

## 2023-03-23 NOTE — Progress Notes (Signed)
 Specialty Pharmacy Ongoing Clinical Assessment Note  Donna Holmes is a 73 y.o. female who is being followed by the specialty pharmacy service for RxSp Interstitial Lung Disease   Patient's specialty medication(s) reviewed today: Pirfenidone    Missed doses in the last 4 weeks: 0   Patient/Caregiver did not have any additional questions or concerns.   Therapeutic benefit summary: Unable to assess   Adverse events/side effects summary: No adverse events/side effects   Patient's therapy is appropriate to: Continue    Goals Addressed             This Visit's Progress    Stabilization of disease       Patient is unable to be assessed as therapy was recently initiated. Patient will maintain adherence         Follow up:  6 months  Donna Holmes Specialty Pharmacist

## 2023-03-23 NOTE — Progress Notes (Signed)
 Specialty Pharmacy Refill Coordination Note  Donna Holmes is a 73 y.o. female contacted today regarding refills of specialty medication(s) Pirfenidone    Patient requested Delivery   Delivery date: 03/30/23   Verified address: [address] [place]   Medication will be filled on 03/29/23.

## 2023-03-29 ENCOUNTER — Other Ambulatory Visit: Payer: Self-pay

## 2023-04-15 ENCOUNTER — Other Ambulatory Visit (INDEPENDENT_AMBULATORY_CARE_PROVIDER_SITE_OTHER): Payer: Medicare Other

## 2023-04-15 DIAGNOSIS — E538 Deficiency of other specified B group vitamins: Secondary | ICD-10-CM | POA: Diagnosis not present

## 2023-04-15 DIAGNOSIS — E559 Vitamin D deficiency, unspecified: Secondary | ICD-10-CM | POA: Diagnosis not present

## 2023-04-15 DIAGNOSIS — E7800 Pure hypercholesterolemia, unspecified: Secondary | ICD-10-CM

## 2023-04-15 DIAGNOSIS — R739 Hyperglycemia, unspecified: Secondary | ICD-10-CM

## 2023-04-15 LAB — VITAMIN D 25 HYDROXY (VIT D DEFICIENCY, FRACTURES): VITD: 61.14 ng/mL (ref 30.00–100.00)

## 2023-04-15 LAB — LIPID PANEL
Cholesterol: 206 mg/dL — ABNORMAL HIGH (ref 0–200)
HDL: 43.2 mg/dL (ref >39.00)
LDL Cholesterol: 117 mg/dL — ABNORMAL HIGH (ref 0–99)
NonHDL: 162.97
Total CHOL/HDL Ratio: 5
Triglycerides: 229 mg/dL — ABNORMAL HIGH (ref 0.0–149.0)
VLDL: 45.8 mg/dL — ABNORMAL HIGH (ref 0.0–40.0)

## 2023-04-15 LAB — BASIC METABOLIC PANEL
BUN: 13 mg/dL (ref 6–23)
CO2: 30 meq/L (ref 19–32)
Calcium: 9.4 mg/dL (ref 8.4–10.5)
Chloride: 103 meq/L (ref 96–112)
Creatinine, Ser: 0.75 mg/dL (ref 0.40–1.20)
GFR: 78.09 mL/min (ref >60.00)
Glucose, Bld: 83 mg/dL (ref 70–99)
Potassium: 4.2 meq/L (ref 3.5–5.1)
Sodium: 141 meq/L (ref 135–145)

## 2023-04-15 LAB — URINALYSIS, ROUTINE W REFLEX MICROSCOPIC
Bilirubin Urine: NEGATIVE
Hgb urine dipstick: NEGATIVE
Ketones, ur: NEGATIVE
Leukocytes,Ua: NEGATIVE
Nitrite: NEGATIVE
Specific Gravity, Urine: 1.015 (ref 1.000–1.030)
Total Protein, Urine: NEGATIVE
Urine Glucose: NEGATIVE
Urobilinogen, UA: 0.2 (ref 0.0–1.0)
pH: 7 (ref 5.0–8.0)

## 2023-04-15 LAB — CBC WITH DIFFERENTIAL/PLATELET
Basophils Absolute: 0.1 10*3/uL (ref 0.0–0.1)
Basophils Relative: 1.3 % (ref 0.0–3.0)
Eosinophils Absolute: 0.1 10*3/uL (ref 0.0–0.7)
Eosinophils Relative: 2.2 % (ref 0.0–5.0)
HCT: 39.3 % (ref 36.0–46.0)
Hemoglobin: 12.9 g/dL (ref 12.0–15.0)
Lymphocytes Relative: 36.9 % (ref 12.0–46.0)
Lymphs Abs: 2.5 10*3/uL (ref 0.7–4.0)
MCHC: 32.7 g/dL (ref 30.0–36.0)
MCV: 90.5 fL (ref 78.0–100.0)
Monocytes Absolute: 0.5 10*3/uL (ref 0.1–1.0)
Monocytes Relative: 7.3 % (ref 3.0–12.0)
Neutro Abs: 3.5 10*3/uL (ref 1.4–7.7)
Neutrophils Relative %: 52.3 % (ref 43.0–77.0)
Platelets: 288 10*3/uL (ref 150.0–400.0)
RBC: 4.35 Mil/uL (ref 3.87–5.11)
RDW: 13.9 % (ref 11.5–15.5)
WBC: 6.7 10*3/uL (ref 4.0–10.5)

## 2023-04-15 LAB — HEPATIC FUNCTION PANEL
ALT: 10 U/L (ref 0–35)
AST: 16 U/L (ref 0–37)
Albumin: 4.6 g/dL (ref 3.5–5.2)
Alkaline Phosphatase: 67 U/L (ref 39–117)
Bilirubin, Direct: 0.1 mg/dL (ref 0.0–0.3)
Total Bilirubin: 0.4 mg/dL (ref 0.2–1.2)
Total Protein: 7.4 g/dL (ref 6.0–8.3)

## 2023-04-15 LAB — MICROALBUMIN / CREATININE URINE RATIO
Creatinine,U: 110.6 mg/dL
Microalb Creat Ratio: 0.7 mg/g (ref 0.0–30.0)
Microalb, Ur: 0.8 mg/dL (ref 0.0–1.9)

## 2023-04-15 LAB — TSH: TSH: 1.75 u[IU]/mL (ref 0.35–5.50)

## 2023-04-15 LAB — HEMOGLOBIN A1C: Hgb A1c MFr Bld: 6 % (ref 4.6–6.5)

## 2023-04-15 LAB — VITAMIN B12: Vitamin B-12: 1168 pg/mL — ABNORMAL HIGH (ref 211–911)

## 2023-04-20 ENCOUNTER — Other Ambulatory Visit: Payer: Self-pay

## 2023-04-20 NOTE — Progress Notes (Signed)
 Specialty Pharmacy Refill Coordination Note  Donna Holmes is a 73 y.o. female contacted today regarding refills of specialty medication(s) Pirfenidone    Patient requested Delivery   Delivery date: 04/28/23   Verified address: [address]   [place] [address]   Medication will be filled on 04/27/23.

## 2023-04-21 ENCOUNTER — Other Ambulatory Visit: Payer: Self-pay | Admitting: Obstetrics and Gynecology

## 2023-04-21 DIAGNOSIS — Z1231 Encounter for screening mammogram for malignant neoplasm of breast: Secondary | ICD-10-CM

## 2023-04-22 ENCOUNTER — Ambulatory Visit: Payer: Medicare Other | Admitting: Internal Medicine

## 2023-04-22 ENCOUNTER — Encounter: Payer: Self-pay | Admitting: Internal Medicine

## 2023-04-22 VITALS — BP 120/68 | HR 85 | Temp 98.8°F | Ht 63.0 in | Wt 132.0 lb

## 2023-04-22 DIAGNOSIS — Z0001 Encounter for general adult medical examination with abnormal findings: Secondary | ICD-10-CM

## 2023-04-22 DIAGNOSIS — E7800 Pure hypercholesterolemia, unspecified: Secondary | ICD-10-CM | POA: Diagnosis not present

## 2023-04-22 DIAGNOSIS — Z0000 Encounter for general adult medical examination without abnormal findings: Secondary | ICD-10-CM | POA: Diagnosis not present

## 2023-04-22 DIAGNOSIS — R9431 Abnormal electrocardiogram [ECG] [EKG]: Secondary | ICD-10-CM | POA: Diagnosis not present

## 2023-04-22 DIAGNOSIS — R739 Hyperglycemia, unspecified: Secondary | ICD-10-CM | POA: Diagnosis not present

## 2023-04-22 NOTE — Patient Instructions (Addendum)
 Please have your Shingrix (shingles) shots done at your local pharmacy.  Please continue all other medications as before, and refills have been done if requested.  Please have the pharmacy call with any other refills you may need.  Please continue your efforts at being more active, low cholesterol diet, and weight control.  You are otherwise up to date with prevention measures today.  Please keep your appointments with your specialists as you may have planned  You will be contacted regarding the referral for: Cardiac CT score  If abnormal, we may need to consider starting Repatha shots for cholesterol  Please make an Appointment to return in 6 months, or sooner if needed

## 2023-04-22 NOTE — Progress Notes (Signed)
 Patient ID: Donna Holmes, female   DOB: 1950/09/14, 73 y.o.   MRN: [id number]         Chief Complaint:: wellness exam and hld, hyperglycemia,        HPI:  Donna Holmes is a 73 y.o. female here for wellness exam; for shingrix at pharmacy, decline covid booster, o/w up to date                        Also Pt denies chest pain, increased sob or doe, wheezing, orthopnea, PND, increased LE swelling, palpitations, dizziness or syncope.   Pt denies polydipsia, polyuria, or new focal neuro s/s.    Pt denies fever, wt loss, night sweats, loss of appetite, or other constitutional symptoms  Has been statin intolerant.  Willing for Ct cardiac score.     Wt Readings from Last 3 Encounters:  04/22/23 132 lb (59.9 kg)  11/09/22 127 lb 6.4 oz (57.8 kg)  10/20/22 126 lb 4 oz (57.3 kg)   BP Readings from Last 3 Encounters:  04/22/23 120/68  11/09/22 110/78  10/20/22 98/66   Immunization History  Administered Date(s) Administered   Fluad Quad(high Dose 65+) 12/13/2018, 01/16/2020, 02/19/2022   Hep B, Unspecified 05/16/1981   Influenza, High Dose Seasonal PF 01/15/2021, 12/20/2022   Influenza-Unspecified 12/02/2015, 12/21/2016, 12/28/2017, 03/18/2020   PFIZER Comirnaty(Gray Top)Covid-19 Tri-Sucrose Vaccine 07/24/2020   PFIZER(Purple Top)SARS-COV-2 Vaccination 04/22/2019, 05/17/2019, 12/23/2019   Pfizer Covid-19 Vaccine Bivalent Booster 82yrs & up 03/02/2021   Pfizer(Comirnaty)Fall Seasonal Vaccine 12 years and older 12/20/2022   Pneumococcal Conjugate-13 01/29/2014   Pneumococcal Polysaccharide-23 05/06/2017   Td 05/19/2009   Unspecified SARS-COV-2 Vaccination 12/18/2021, 04/15/2022   Health Maintenance Due  Topic Date Due   Zoster Vaccines- Shingrix (1 of 2) Never done   Medicare Annual Wellness (AWV)  05/09/2019      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    Complication of anesthesia    GERD (gastroesophageal reflux disease)    Headache(784.0) 05/19/2009   Past Surgical History:  Procedure Laterality Date   ANTERIOR AND POSTERIOR REPAIR WITH SACROSPINOUS FIXATION N/A 05/16/2014   Procedure: ANTERIOR AND POSTERIOR REPAIR WITH SACROSPINOUS LIGAMENT SUSPENSION;  Surgeon: Alm JAYSON Cook, MD;  Location: [facility];  Service: Gynecology;  Laterality: N/A;   BARTHOLIN CYST MARSUPIALIZATION     BUNIONECTOMY Right    LAPAROSCOPIC ASSISTED VAGINAL HYSTERECTOMY N/A 05/16/2014   Procedure: LAPAROSCOPIC ASSISTED VAGINAL HYSTERECTOMY;  Surgeon: Alm JAYSON Cook, MD;  Location: [facility];  Service: Gynecology;  Laterality: N/A;   NEUROMA SURGERY Right    2nd interspace    reports that she has never smoked. She has never used smokeless tobacco. She reports that she does not drink alcohol and does not use drugs. family history includes Alcohol abuse in her father; Hypertension in her mother; Prostate cancer in her maternal uncle. Allergies  Allergen Reactions   Aspirin    Codeine Nausea And Vomiting   Crestor  [Rosuvastatin ] Other (See Comments)    Muslce pain   Oxycontin [Oxycodone Hcl] Nausea Only and Other (See Comments)    FELT LIKE I WAS GOING TO FAINT   Statins Other (See Comments)    myalgias   Sulfa  Antibiotics Nausea And Vomiting   Ibuprofen  Other (See Comments)    Other reaction(s): GI Bleed   Current Outpatient Medications on File Prior to Visit  Medication Sig Dispense Refill   acetaminophen (TYLENOL) 500 MG tablet Take 500 mg by mouth every 6 (six) hours as needed.     albuterol  (VENTOLIN  HFA) 108 (90 Base) MCG/ACT inhaler TAKE 2 PUFFS BY MOUTH EVERY 6 HOURS AS NEEDED FOR WHEEZE OR SHORTNESS OF BREATH 8.5 each 5   calcium  carbonate (OS-CAL) 600 MG TABS tablet Take 600 mg by mouth daily.     cetirizine-pseudoephedrine (ZYRTEC-D) 5-120 MG per tablet Take 1 tablet by mouth once as needed for allergies.     Cholecalciferol (VITAMIN D -3 PO) Take 4,000 Units by  mouth daily.     estradiol  (ESTRACE ) 0.1 MG/GM vaginal cream Place 1 Applicatorful vaginally 2 (two) times a week.     Estradiol -Estriol-Progesterone  (BIEST/PROGESTERONE ) CREA Apply 2 clicks daily, except on Sunday. 40 g PRN   pantoprazole  (PROTONIX ) 40 MG tablet TAKE 1 TABLET BY MOUTH EVERY DAY 90 tablet 3   Pirfenidone  801 MG TABS Take 801 mg by mouth 3 (three) times daily 90 tablet 4   No current facility-administered medications on file prior to visit.        ROS:  All others reviewed and negative.  Objective        PE:  BP 120/68 (BP Location: Right Arm, Patient Position: Sitting, Cuff Size: Normal)   Pulse 85   Temp 98.8 F (37.1 C) (Oral)   Ht 5' 3 (1.6 m)   Wt 132 lb (59.9 kg)   SpO2 94%   BMI 23.38 kg/m                 Constitutional: Pt appears in NAD               HENT: Head: NCAT.                Right Ear: External ear normal.                 Left Ear: External ear normal.                Eyes: . Pupils are equal, round, and reactive to light. Conjunctivae and EOM are normal               Nose: without d/c or deformity               Neck: Neck supple. Gross normal ROM               Cardiovascular: Normal rate and regular rhythm.                 Pulmonary/Chest: Effort normal and breath sounds without rales or wheezing.                Abd:  Soft, NT, ND, + BS, no organomegaly               Neurological: Pt is alert. At baseline orientation, motor grossly intact               Skin: Skin is warm. No rashes, no other new lesions, LE edema - none               Psychiatric: Pt behavior is normal without agitation   Micro: none  Cardiac tracings I have personally interpreted today:  none  Pertinent Radiological findings (summarize): none   Lab Results  Component Value Date   WBC 6.7 04/15/2023   HGB 12.9 04/15/2023   HCT 39.3 04/15/2023   PLT 288.0 04/15/2023   GLUCOSE 83  04/15/2023   CHOL 206 (H) 04/15/2023   TRIG 229.0 (H) 04/15/2023   HDL 43.20 04/15/2023    LDLDIRECT 76.0 04/21/2022   LDLCALC 117 (H) 04/15/2023   ALT 10 04/15/2023   AST 16 04/15/2023   NA 141 04/15/2023   K 4.2 04/15/2023   CL 103 04/15/2023   CREATININE 0.75 04/15/2023   BUN 13 04/15/2023   CO2 30 04/15/2023   TSH 1.75 04/15/2023   HGBA1C 6.0 04/15/2023   MICROALBUR 0.8 04/15/2023   Assessment/Plan:  Donna Holmes is a 73 y.o. White or Caucasian [1] female with  has a past medical history of ALLERGIC RHINITIS (05/19/2009), Anemia, iron deficiency (06/09/2015), B12 deficiency (08/02/2018), Chagas' disease (12/29/2010), Complication of anesthesia, GERD (gastroesophageal reflux disease), and Headache(784.0) (05/19/2009).  Preventative health care Age and sex appropriate education and counseling updated with regular exercise and diet Referrals for preventative services - none needed Immunizations addressed - for shingrix at pharmacy, declines covid booster Smoking counseling  - none needed Evidence for depression or other mood disorder - none significant Most recent labs reviewed. I have personally reviewed and have noted: 1) the patient's medical and social history 2) The patient's current medications and supplements 3) The patient's height, weight, and BMI have been recorded in the chart   Hyperglycemia Lab Results  Component Value Date   HGBA1C 6.0 04/15/2023   Stable, pt to continue current medical treatment  - diet, wt control   Hyperlipidemia Lab Results  Component Value Date   LDLCALC 117 (H) 04/15/2023   Uncontrolled, for lower chol diet, for Cardiac CT score, has been statin intolerant in past, consider repatha   Followup: Return in about 6 months (around 10/20/2023).  Lynwood Rush, MD 04/24/2023 7:56 PM [facility]

## 2023-04-24 ENCOUNTER — Encounter: Payer: Self-pay | Admitting: Internal Medicine

## 2023-04-24 NOTE — Assessment & Plan Note (Signed)
 Lab Results  Component Value Date   LDLCALC 117 (H) 04/15/2023   Uncontrolled, for lower chol diet, for Cardiac CT score, has been statin intolerant in past, consider repatha

## 2023-04-24 NOTE — Assessment & Plan Note (Signed)
 Age and sex appropriate education and counseling updated with regular exercise and diet Referrals for preventative services - none needed Immunizations addressed - for shingrix at pharmacy, declines covid booster Smoking counseling  - none needed Evidence for depression or other mood disorder - none significant Most recent labs reviewed. I have personally reviewed and have noted: 1) the patient's medical and social history 2) The patient's current medications and supplements 3) The patient's height, weight, and BMI have been recorded in the chart

## 2023-04-24 NOTE — Assessment & Plan Note (Signed)
 Lab Results  Component Value Date   HGBA1C 6.0 04/15/2023   Stable, pt to continue current medical treatment  - diet, wt control

## 2023-04-27 ENCOUNTER — Other Ambulatory Visit: Payer: Self-pay

## 2023-05-02 ENCOUNTER — Ambulatory Visit (HOSPITAL_COMMUNITY)
Admission: RE | Admit: 2023-05-02 | Discharge: 2023-05-02 | Disposition: A | Discharge status: Home / Self Care | Payer: Medicare Other | Source: Ambulatory Visit | Attending: Internal Medicine | Admitting: Internal Medicine

## 2023-05-02 ENCOUNTER — Encounter: Payer: Self-pay | Admitting: Internal Medicine

## 2023-05-02 DIAGNOSIS — E7800 Pure hypercholesterolemia, unspecified: Secondary | ICD-10-CM | POA: Insufficient documentation

## 2023-05-02 DIAGNOSIS — R739 Hyperglycemia, unspecified: Secondary | ICD-10-CM | POA: Insufficient documentation

## 2023-05-02 DIAGNOSIS — R9431 Abnormal electrocardiogram [ECG] [EKG]: Secondary | ICD-10-CM | POA: Insufficient documentation

## 2023-05-12 ENCOUNTER — Encounter: Payer: Self-pay | Admitting: Internal Medicine

## 2023-05-12 ENCOUNTER — Ambulatory Visit: Payer: Medicare Other | Admitting: Internal Medicine

## 2023-05-12 VITALS — BP 108/70 | HR 66 | Temp 97.9°F | Ht 63.0 in | Wt 132.8 lb

## 2023-05-12 DIAGNOSIS — N951 Menopausal and female climacteric states: Secondary | ICD-10-CM

## 2023-05-12 DIAGNOSIS — J84112 Idiopathic pulmonary fibrosis: Secondary | ICD-10-CM | POA: Diagnosis not present

## 2023-05-12 NOTE — Patient Instructions (Addendum)
 Titration schedule for hormones Cut back to Monday and Friday dosing only x 3-4 weeks, then once weekly  Same dosing schedule for testosterone until finished  Magnesium glycinate - can be used to help with sleep if insomnia develops when weaned off of progesterone.  Menopause and HRT (Hormone Replacement Therapy): What to Expect Menopause is the time in your life when your menstrual period stops, and your ovaries stop making certain hormones. These hormones are called estrogen and progesterone. Low levels of these hormones can affect your health and cause symptoms. Hormone replacement therapy (HRT) is a treatment that replaces the estrogen and progesterone in your body. Types of HRT  There are many types and doses of HRT. Talk with your health care provider about what form of HRT is best for you. HRT may include: Estrogen and progestin. This is more common if you have a uterus. Estrogen-only therapy. This is used if you don't have a uterus. You may be given the HRT as: Pills. Patches. Gels. Sprays. A vaginal cream. Vaginal rings. Vaginal inserts. How many hormones you need to take and how long you should take them depend on your health. You should: Start HRT at the lowest possible dose. Stop HRT as soon as you're told to. Work with your provider so you feel informed and comfortable with what happens. Tell a health care provider about: Any allergies you have. Whether you've had blood clots or are at risk for blood clots. Whether you or family members have had cancer, such as cancer of: The breasts. The ovaries. The uterus. Any surgeries you've had. Any medical problems you have. All medicines you take. These include vitamins, herbs, eye drops, and creams. Whether you're pregnant or may be pregnant. What are the benefits? HRT can help with the symptoms of menopause. The benefits depend on: What symptoms you have. How bad your symptoms are. Your overall health. Symptoms of  menopause that HRT can help with include: Hot flashes and night sweats. Hot flashes are sudden feelings of heat that spread over your face and body. Your skin may turn red, like a blush. Night sweats are hot flashes that happen when you're sleeping or trying to sleep. Bone loss. The body loses calcium faster after menopause. This can cause your bones to be weaker. They may be more likely to break. Vaginal dryness. The lining of the vagina can get thin and dry, which can cause: Pain during sex. Infection. Burning. Itching. Urinary tract infections. Not being able to control when you pee. Irritability, which means getting annoyed easily. Short-term memory problems. What are the risks? Risks depend on your health and medical history. They also depend on if you get estrogen and progestin or estrogen-only therapy. HRT may make you more likely to have: Spotting. This is when a small amount of blood leaks from your vagina when you don't expect it to. Endometrial cancer. This is cancer of the lining of the uterus. Breast cancer. Higher density of breast tissue. This can make it harder to find breast cancer on an X-ray. A stroke. Heart disease. Blood clots. Gallbladder or liver disease. You may be more at risk for problems if you have: Endometrial cancer. Liver disease. Heart disease. Breast cancer. A history of blood clots. A history of stroke. Follow these instructions at home: Take your medicines only as told. Do not smoke, vape, or use nicotine or tobacco. Go to your provider as often as told to get: Mammograms. Pelvic exams. Medical checkups. Contact a health care provider  if: Your legs hurt or swell. You feel lumps or changes in your breasts or armpits. You feel pain, burning, or bleeding when you pee. You have bleeding from your vagina that's not normal. You feel dizzy or get headaches. You have pain in your belly. Get help right away if: You have shortness of breath. You  have chest pain. You have slurred speech. Any part of your arms or legs feels weak or numb. These symptoms may be an emergency. Call 911 right away. Do not wait to see if the symptoms will go away. Do not drive yourself to the hospital. This information is not intended to replace advice given to you by your health care provider. Make sure you discuss any questions you have with your health care provider. Document Revised: 10/29/2022 Document Reviewed: 10/29/2022 Elsevier Patient Education  2024 [employer].

## 2023-05-12 NOTE — Assessment & Plan Note (Addendum)
 She is followed by [facility], no known etiology at this time. States her sx are stable.

## 2023-05-12 NOTE — Assessment & Plan Note (Signed)
 Chronic, she is currently on Biest/prog HRT and testosterone topically.  She is currently using MWF and using testosterone 1 click topically MWF.  She is advised to decrease to twice weekly x 4 weeks, then once weekly and then she can stop this treatment.  Labs drawn at other offices reviewed in full detail. She will not schedule any f/u.

## 2023-05-12 NOTE — Progress Notes (Signed)
 I,Victoria T Deloria Lair, CMA,acting as a Neurosurgeon for Gwynneth Aliment, MD.,have documented all relevant documentation on the behalf of Gwynneth Aliment, MD,as directed by  Gwynneth Aliment, MD while in the presence of Gwynneth Aliment, MD.  Subjective:  Patient ID: Donna Holmes , female    DOB: 1950/11/14 , 73 y.o.   MRN: [id number]  Chief Complaint  Patient presents with   BHRT    HPI  The patient is here today for a follow-up of BHRT therapy. She feels well on her current regimen. She would like to discuss weaning off of medication Estradiol- Estriol-Progesterone compounded cream. She is now on vaginal estrogen cream per Urology.      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   Complication of anesthesia    GERD (gastroesophageal reflux disease)    Headache(784.0) 05/19/2009     Family History  Problem Relation Age of Onset   Alcohol abuse Father    Hypertension Mother    Prostate cancer Maternal Uncle      Current Outpatient Medications:    acetaminophen (TYLENOL) 500 MG tablet, Take 500 mg by mouth every 6 (six) hours as needed., Disp: , Rfl:    albuterol (VENTOLIN HFA) 108 (90 Base) MCG/ACT inhaler, TAKE 2 PUFFS BY MOUTH EVERY 6 HOURS AS NEEDED FOR WHEEZE OR SHORTNESS OF BREATH, Disp: 8.5 each, Rfl: 5   calcium carbonate (OS-CAL) 600 MG TABS tablet, Take 600 mg by mouth daily., Disp: , Rfl:    cetirizine-pseudoephedrine (ZYRTEC-D) 5-120 MG per tablet, Take 1 tablet by mouth once as needed for allergies., Disp: , Rfl:    Cholecalciferol (VITAMIN D-3 PO), Take 4,000 Units by mouth daily., Disp: , Rfl:    estradiol (ESTRACE) 0.1 MG/GM vaginal cream, Place 1 Applicatorful vaginally 2 (two) times a week., Disp: , Rfl:    Estradiol-Estriol-Progesterone (BIEST/PROGESTERONE) CREA, Apply 2 clicks daily, except on Sunday., Disp: 40 g, Rfl: PRN   pantoprazole (PROTONIX)  40 MG tablet, TAKE 1 TABLET BY MOUTH EVERY DAY, Disp: 90 tablet, Rfl: 3   Pirfenidone 801 MG TABS, Take 801 mg by mouth 3 (three) times daily, Disp: 90 tablet, Rfl: 4   Allergies  Allergen Reactions   Aspirin    Codeine Nausea And Vomiting   Crestor [Rosuvastatin] Other (See Comments)    Muslce pain   Oxycontin [Oxycodone Hcl] Nausea Only and Other (See Comments)    FELT LIKE I WAS GOING TO FAINT   Statins Other (See Comments)    myalgias   Sulfa Antibiotics Nausea And Vomiting   Ibuprofen Other (See Comments)    Other reaction(s): GI Bleed     Review of Systems  Constitutional: Negative.   Respiratory: Negative.    Cardiovascular: Negative.   Gastrointestinal: Negative.   Neurological: Negative.   Psychiatric/Behavioral: Negative.       Today's Vitals   05/12/23 1449  BP: 108/70  Pulse: 66  Temp: 97.9 F (36.6 C)  SpO2: 98%  Weight: 132 lb 12.8 oz (60.2 kg)  Height: 5\' 3"  (1.6 m)   Body mass index is 23.52 kg/m.  Wt Readings from Last 3 Encounters:  05/12/23 132 lb 12.8 oz (60.2 kg)  04/22/23 132 lb (59.9 kg)  11/09/22 127 lb 6.4 oz (57.8 kg)     Objective:  Physical Exam Vitals and nursing note reviewed.  Constitutional:  Appearance: Normal appearance.  HENT:     Head: Normocephalic and atraumatic.  Eyes:     Extraocular Movements: Extraocular movements intact.  Cardiovascular:     Rate and Rhythm: Normal rate and regular rhythm.     Heart sounds: Normal heart sounds.  Pulmonary:     Effort: Pulmonary effort is normal.     Breath sounds: Normal breath sounds.  Musculoskeletal:     Cervical back: Normal range of motion.  Skin:    General: Skin is warm.  Neurological:     General: No focal deficit present.     Mental Status: She is alert.  Psychiatric:        Mood and Affect: Mood normal.        Behavior: Behavior normal.         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  She is currently using MWF and using testosterone 1 click topically MWF.  She is advised to decrease to twice weekly x 4 weeks, then once weekly and then she can stop this treatment.  Labs drawn at other offices reviewed in full detail. She will not schedule any f/u.      IPF (idiopathic pulmonary fibrosis) (HCC) Assessment & Plan: She is followed by [facility], no known etiology at this time. States her sx are stable.       Return if symptoms worsen or fail to improve.  Patient was given opportunity to ask questions. Patient verbalized understanding of the plan and was able to repeat key elements of the plan. All questions were answered to their satisfaction.    I, Gwynneth Aliment, MD, have reviewed all documentation for this visit. The documentation on 05/12/23 for the exam, diagnosis, procedures, and orders are all accurate and complete.   IF YOU HAVE BEEN REFERRED TO A SPECIALIST, IT MAY TAKE 1-2 WEEKS TO SCHEDULE/PROCESS THE REFERRAL. IF YOU HAVE NOT HEARD FROM US/SPECIALIST IN TWO WEEKS, PLEASE GIVE [place] A CALL AT [phone].   THE PATIENT IS ENCOURAGED TO PRACTICE SOCIAL DISTANCING DUE TO THE COVID-19 PANDEMIC.

## 2023-05-17 ENCOUNTER — Other Ambulatory Visit (HOSPITAL_COMMUNITY): Payer: Self-pay

## 2023-05-19 ENCOUNTER — Ambulatory Visit: Payer: Medicare Other

## 2023-05-20 ENCOUNTER — Other Ambulatory Visit: Payer: Self-pay

## 2023-05-20 ENCOUNTER — Other Ambulatory Visit (HOSPITAL_COMMUNITY): Payer: Self-pay

## 2023-05-20 MED ORDER — PIRFENIDONE 801 MG PO TABS
ORAL_TABLET | ORAL | 4 refills | Status: DC
  Filled 2023-05-20: qty 90, 30d supply, fill #0
  Filled 2023-06-23: qty 90, 30d supply, fill #1
  Filled 2023-07-25: qty 90, 30d supply, fill #2
  Filled 2023-08-29 – 2023-08-31 (×3): qty 90, 30d supply, fill #3
  Filled 2023-09-26 (×3): qty 90, 30d supply, fill #4

## 2023-05-20 NOTE — Progress Notes (Signed)
 Specialty Pharmacy Refill Coordination Note  Donna Holmes is a 73 y.o. female contacted today regarding refills of specialty medication(s) Pirfenidone   Patient requested Delivery   Delivery date: 05/27/23   Verified address: [address]   [place] [address]   Medication will be filled on 05/26/23.   This fill date is pending response to refill request from provider. Patient is aware and if they have not received fill by intended date, they must follow up with pharmacy.

## 2023-05-23 ENCOUNTER — Ambulatory Visit
Admission: RE | Admit: 2023-05-23 | Discharge: 2023-05-23 | Disposition: A | Discharge status: Home / Self Care | Source: Ambulatory Visit | Attending: Obstetrics and Gynecology | Admitting: Obstetrics and Gynecology

## 2023-05-23 DIAGNOSIS — Z1231 Encounter for screening mammogram for malignant neoplasm of breast: Secondary | ICD-10-CM

## 2023-05-27 ENCOUNTER — Telehealth: Admitting: Nurse Practitioner

## 2023-05-27 VITALS — Temp 99.6°F

## 2023-05-27 DIAGNOSIS — J069 Acute upper respiratory infection, unspecified: Secondary | ICD-10-CM | POA: Diagnosis not present

## 2023-05-27 MED ORDER — PROMETHAZINE-DM 6.25-15 MG/5ML PO SYRP: 5.0000 mL | ORAL_SOLUTION | Freq: Three times a day (TID) | ORAL | 0 refills | Status: AC | PRN

## 2023-05-27 MED ORDER — DOXYCYCLINE HYCLATE 100 MG PO TABS: 100.0000 mg | ORAL_TABLET | Freq: Two times a day (BID) | ORAL | 0 refills | Status: AC

## 2023-05-27 NOTE — Assessment & Plan Note (Addendum)
 Acute Not sure if etiology is of viral origin or bacterial origin, however patient does have significant underlying lung disease and is having productive cough. Recommend that she monitor her symptoms for 48 more hours to see if symptoms start to improve, if they worsen would recommend she start doxycycline 100 mg twice daily x 10 days.  Will send prescriptions that she has this on hand.  Will also send in promethazine dextromethorphan cough syrup to help with cough suppression, patient warned to avoid driving or operating heavy machinery while using cough syrup.  She reports understanding.  Patient also encouraged to continue albuterol inhaler as needed for cough or wheezing.  She reports understanding. Patient encouraged to follow-up in person in clinic if symptoms worsen or do not improve despite treatment plan.  She reports her understanding.

## 2023-05-27 NOTE — Progress Notes (Signed)
   Established Patient Office Visit  An audio/visual tele-health visit was completed today for this patient. I connected with  Donna Holmes on 05/27/23 utilizing audio/visual technology and verified that I am speaking with the correct person using two identifiers. The patient was located at their home, and I was located at the office of [facility] during the encounter. I discussed the limitations of evaluation and management by telemedicine. The patient expressed understanding and agreed to proceed.    Subjective   Patient ID: Donna Holmes, female    DOB: 1950-06-15  Age: 73 y.o. MRN: [id number]  Chief Complaint  Patient presents with   Cough   Patient has for acute visit for the above.  Symptom onset 2 days ago.  Patient does have underlying history of pulmonary fibrosis.  She reports that she is prone to bronchitis and feels that this is bronchitis again.  She is experiencing chills, congestion, sinus pain, productive cough, wheezing, muscle aches, headaches.  She has not been exposed to any known sick contacts.  Has not taken at home COVID or flu test.      Review of Systems  Constitutional:  Positive for chills.  HENT:  Positive for congestion and sinus pain.   Respiratory:  Positive for cough, sputum production and wheezing. Negative for shortness of breath.   Cardiovascular:  Negative for chest pain.  Musculoskeletal:  Positive for myalgias and neck pain.  Neurological:  Positive for headaches.      Objective:     Temp 99.6 F (37.6 C) Comment: oral   Physical Exam Comprehensive physical exam not completed today as office visit was conducted remotely.  Patient coughs frequently during exam, no signs of respiratory distress identified, but she also sounds congested.  Patient was alert and oriented, and appeared to have appropriate judgment.   No results found for any visits on 05/27/23.    The 10-year ASCVD risk score (Arnett DK, et  al., 2019) is: 11.8%    Assessment & Plan:   Problem List Items Addressed This Visit       Respiratory   Upper respiratory tract infection - Primary   Acute Not sure if etiology is of viral origin or bacterial origin, however patient does have significant underlying lung disease and is having productive cough. Recommend that she monitor her symptoms for 48 more hours to see if symptoms start to improve, if they worsen would recommend she start doxycycline 100 mg twice daily x 10 days.  Will send prescriptions that she has this on hand.  Will also send in promethazine dextromethorphan cough syrup to help with cough suppression, patient warned to avoid driving or operating heavy machinery while using cough syrup.  She reports understanding.  Patient also encouraged to continue albuterol inhaler as needed for cough or wheezing.  She reports understanding. Patient encouraged to follow-up in person in clinic if symptoms worsen or do not improve despite treatment plan.  She reports her understanding.      Relevant Medications   doxycycline (VIBRA-TABS) 100 MG tablet   promethazine-dextromethorphan (PROMETHAZINE-DM) 6.25-15 MG/5ML syrup    Return if symptoms worsen or fail to improve.    Elenore Paddy, NP

## 2023-06-21 ENCOUNTER — Other Ambulatory Visit (HOSPITAL_COMMUNITY): Payer: Self-pay

## 2023-06-23 ENCOUNTER — Other Ambulatory Visit: Payer: Self-pay

## 2023-06-23 NOTE — Progress Notes (Signed)
 Specialty Pharmacy Refill Coordination Note  Donna Holmes is a 73 y.o. female contacted today regarding refills of specialty medication(s) Pirfenidone   Patient requested (Patient-Rptd) Delivery   Delivery date: (Patient-Rptd) 06/28/23   Verified address: (Patient-Rptd) [address] [place] [address]   Medication will be filled on 04.14.25.

## 2023-07-25 ENCOUNTER — Other Ambulatory Visit: Payer: Self-pay | Admitting: Pharmacy Technician

## 2023-07-25 ENCOUNTER — Other Ambulatory Visit: Payer: Self-pay

## 2023-07-25 NOTE — Progress Notes (Signed)
 Specialty Pharmacy Refill Coordination Note  Donna Holmes is a 73 y.o. female contacted today regarding refills of specialty medication(s) Pirfenidone    Patient requested Delivery   Delivery date: 08/01/23   Verified address: [address], [place], [place] [address]   Medication will be filled on 07/29/23.

## 2023-07-29 ENCOUNTER — Other Ambulatory Visit: Payer: Self-pay

## 2023-08-26 ENCOUNTER — Other Ambulatory Visit: Payer: Self-pay

## 2023-08-27 ENCOUNTER — Encounter (HOSPITAL_COMMUNITY): Payer: Self-pay

## 2023-08-29 ENCOUNTER — Other Ambulatory Visit: Payer: Self-pay

## 2023-08-29 ENCOUNTER — Other Ambulatory Visit (HOSPITAL_COMMUNITY): Payer: Self-pay

## 2023-08-31 ENCOUNTER — Other Ambulatory Visit: Payer: Self-pay

## 2023-08-31 NOTE — Progress Notes (Signed)
 Specialty Pharmacy Refill Coordination Note  Donna Holmes is a 73 y.o. female contacted today regarding refills of specialty medication(s) Pirfenidone    Patient requested Delivery   Delivery date: 09/02/23   Verified address: [address]   [place] [address]   Medication will be filled on 09/01/23.

## 2023-09-01 ENCOUNTER — Other Ambulatory Visit: Payer: Self-pay

## 2023-09-24 ENCOUNTER — Encounter (INDEPENDENT_AMBULATORY_CARE_PROVIDER_SITE_OTHER): Payer: Self-pay

## 2023-09-26 ENCOUNTER — Other Ambulatory Visit: Payer: Self-pay

## 2023-09-26 NOTE — Progress Notes (Signed)
 Specialty Pharmacy Refill Coordination Note  Donna Holmes is a 73 y.o. female contacted today regarding refills of specialty medication(s) Pirfenidone    Patient requested Delivery   Delivery date: 09/28/23   Verified address: [address] [name] [address]   Medication will be filled on 07.14.25 or 7.15.25.

## 2023-09-30 ENCOUNTER — Other Ambulatory Visit: Payer: Self-pay

## 2023-09-30 MED ORDER — PANTOPRAZOLE SODIUM 40 MG PO TBEC: 40.0000 mg | DELAYED_RELEASE_TABLET | Freq: Every day | ORAL | 3 refills | Status: AC

## 2023-10-04 ENCOUNTER — Telehealth: Payer: Self-pay | Admitting: Internal Medicine

## 2023-10-05 NOTE — Telephone Encounter (Signed)
 error

## 2023-10-20 ENCOUNTER — Ambulatory Visit: Payer: Medicare Other | Admitting: Internal Medicine

## 2023-10-21 ENCOUNTER — Other Ambulatory Visit (HOSPITAL_COMMUNITY): Payer: Self-pay

## 2023-10-21 ENCOUNTER — Other Ambulatory Visit: Payer: Self-pay

## 2023-10-21 MED ORDER — PIRFENIDONE 801 MG PO TABS: ORAL_TABLET | ORAL | 1 refills | Status: AC

## 2023-10-21 NOTE — Progress Notes (Signed)
 Disenrolling- per patient she has moved and is establishing care with a pharmacy there

## 2023-10-22 ENCOUNTER — Other Ambulatory Visit (HOSPITAL_COMMUNITY): Payer: Self-pay

## 2023-12-04 ENCOUNTER — Other Ambulatory Visit: Payer: Self-pay | Admitting: Internal Medicine
